# Patient Record
Sex: Female | Born: 1975
Health system: Southern US, Community
[De-identification: ages and names within clinical notes are randomized; demographics above are authoritative.]

## PROBLEM LIST (undated history)

## (undated) DIAGNOSIS — T7840XA Allergy, unspecified, initial encounter: Secondary | ICD-10-CM

## (undated) DIAGNOSIS — U071 COVID-19: Secondary | ICD-10-CM

## (undated) DIAGNOSIS — F329 Major depressive disorder, single episode, unspecified: Secondary | ICD-10-CM

## (undated) DIAGNOSIS — F32A Depression, unspecified: Secondary | ICD-10-CM

## (undated) DIAGNOSIS — J45909 Unspecified asthma, uncomplicated: Secondary | ICD-10-CM

## (undated) HISTORY — DX: COVID-19: U07.1

## (undated) HISTORY — DX: Major depressive disorder, single episode, unspecified: F32.9

## (undated) HISTORY — PX: CHOLECYSTECTOMY: SHX55

## (undated) HISTORY — DX: Depression, unspecified: F32.A

## (undated) HISTORY — PX: NASAL SINUS SURGERY: SHX719

## (undated) HISTORY — PX: AUGMENTATION MAMMAPLASTY: SUR837

## (undated) HISTORY — DX: Allergy, unspecified, initial encounter: T78.40XA

## (undated) HISTORY — DX: Unspecified asthma, uncomplicated: J45.909

## (undated) HISTORY — PX: BREAST SURGERY: SHX581

---

## 2001-10-30 ENCOUNTER — Encounter: Admission: RE | Admit: 2001-10-30 | Discharge: 2002-01-28 | Payer: Self-pay

## 2011-03-14 HISTORY — PX: AUGMENTATION MAMMAPLASTY: SUR837

## 2012-10-11 LAB — HM PAP SMEAR: HM Pap smear: NEGATIVE

## 2013-12-22 ENCOUNTER — Encounter (HOSPITAL_BASED_OUTPATIENT_CLINIC_OR_DEPARTMENT_OTHER): Payer: Self-pay | Admitting: Emergency Medicine

## 2013-12-22 DIAGNOSIS — Z79899 Other long term (current) drug therapy: Secondary | ICD-10-CM | POA: Insufficient documentation

## 2013-12-22 DIAGNOSIS — R079 Chest pain, unspecified: Secondary | ICD-10-CM | POA: Diagnosis not present

## 2013-12-22 NOTE — ED Notes (Signed)
PT presents to ED with complaints of left sided chest pain that started 2 hours ago. Pt states pain is worse with deep breath.

## 2013-12-23 ENCOUNTER — Emergency Department (HOSPITAL_BASED_OUTPATIENT_CLINIC_OR_DEPARTMENT_OTHER)
Admission: EM | Admit: 2013-12-23 | Discharge: 2013-12-23 | Disposition: A | Discharge status: Home / Self Care | Payer: BC Managed Care – PPO | Attending: Emergency Medicine | Admitting: Emergency Medicine

## 2013-12-23 ENCOUNTER — Emergency Department (HOSPITAL_BASED_OUTPATIENT_CLINIC_OR_DEPARTMENT_OTHER): Payer: BC Managed Care – PPO | Attending: Emergency Medicine

## 2013-12-23 ENCOUNTER — Encounter (HOSPITAL_BASED_OUTPATIENT_CLINIC_OR_DEPARTMENT_OTHER): Payer: Self-pay | Admitting: Radiology

## 2013-12-23 DIAGNOSIS — R079 Chest pain, unspecified: Secondary | ICD-10-CM

## 2013-12-23 MED ORDER — IBUPROFEN 800 MG PO TABS: 800.0000 mg | ORAL_TABLET | Freq: Once | ORAL | Status: DC

## 2013-12-23 MED ORDER — NAPROXEN 500 MG PO TABS: 500.0000 mg | ORAL_TABLET | Freq: Two times a day (BID) | ORAL | Status: DC

## 2013-12-23 NOTE — Discharge Instructions (Signed)
Return if symptoms are getting worse.  Chest Pain (Nonspecific) It is often hard to give a specific diagnosis for the cause of chest pain. There is always a chance that your pain could be related to something serious, such as a heart attack or a blood clot in the lungs. You need to follow up with your health care provider for further evaluation. CAUSES   Heartburn.  Pneumonia or bronchitis.  Anxiety or stress.  Inflammation around your heart (pericarditis) or lung (pleuritis or pleurisy).  A blood clot in the lung.  A collapsed lung (pneumothorax). It can develop suddenly on its own (spontaneous pneumothorax) or from trauma to the chest.  Shingles infection (herpes zoster virus). The chest wall is composed of bones, muscles, and cartilage. Any of these can be the source of the pain.  The bones can be bruised by injury.  The muscles or cartilage can be strained by coughing or overwork.  The cartilage can be affected by inflammation and become sore (costochondritis). DIAGNOSIS  Lab tests or other studies may be needed to find the cause of your pain. Your health care provider may have you take a test called an ambulatory electrocardiogram (ECG). An ECG records your heartbeat patterns over a 24-hour period. You may also have other tests, such as:  Transthoracic echocardiogram (TTE). During echocardiography, sound waves are used to evaluate how blood flows through your heart.  Transesophageal echocardiogram (TEE).  Cardiac monitoring. This allows your health care provider to monitor your heart rate and rhythm in real time.  Holter monitor. This is a portable device that records your heartbeat and can help diagnose heart arrhythmias. It allows your health care provider to track your heart activity for several days, if needed.  Stress tests by exercise or by giving medicine that makes the heart beat faster. TREATMENT   Treatment depends on what may be causing your chest pain.  Treatment may include:  Acid blockers for heartburn.  Anti-inflammatory medicine.  Pain medicine for inflammatory conditions.  Antibiotics if an infection is present.  You may be advised to change lifestyle habits. This includes stopping smoking and avoiding alcohol, caffeine, and chocolate.  You may be advised to keep your head raised (elevated) when sleeping. This reduces the chance of acid going backward from your stomach into your esophagus. Most of the time, nonspecific chest pain will improve within 2-3 days with rest and mild pain medicine.  HOME CARE INSTRUCTIONS   If antibiotics were prescribed, take them as directed. Finish them even if you start to feel better.  For the next few days, avoid physical activities that bring on chest pain. Continue physical activities as directed.  Do not use any tobacco products, including cigarettes, chewing tobacco, or electronic cigarettes.  Avoid drinking alcohol.  Only take medicine as directed by your health care provider.  Follow your health care provider's suggestions for further testing if your chest pain does not go away.  Keep any follow-up appointments you made. If you do not go to an appointment, you could develop lasting (chronic) problems with pain. If there is any problem keeping an appointment, call to reschedule. SEEK MEDICAL CARE IF:   Your chest pain does not go away, even after treatment.  You have a rash with blisters on your chest.  You have a fever. SEEK IMMEDIATE MEDICAL CARE IF:   You have increased chest pain or pain that spreads to your arm, neck, jaw, back, or abdomen.  You have shortness of breath.  You  have an increasing cough, or you cough up blood.  You have severe back or abdominal pain.  You feel nauseous or vomit.  You have severe weakness.  You faint.  You have chills. This is an emergency. Do not wait to see if the pain will go away. Get medical help at once. Call your local emergency  services (911 in [place] Do not drive yourself to the hospital. MAKE SURE YOU:   Understand these instructions.  Will watch your condition.  Will get help right away if you are not doing well or get worse. Document Released: 12/07/2004 Document Revised: 03/04/2013 Document Reviewed: 10/03/2007 [facility] Patient Information 2015 [place], [place]. This information is not intended to replace advice given to you by your health care provider. Make sure you discuss any questions you have with your health care provider.  Naproxen and naproxen sodium oral immediate-release tablets What is this medicine? NAPROXEN (na PROX en) is a non-steroidal anti-inflammatory drug (NSAID). It is used to reduce swelling and to treat pain. This medicine may be used for dental pain, headache, or painful monthly periods. It is also used for painful joint and muscular problems such as arthritis, tendinitis, bursitis, and gout. This medicine may be used for other purposes; ask your health care provider or pharmacist if you have questions. COMMON BRAND NAME(S): Aflaxen, Aleve, Aleve Arthritis, All Day Relief, Anaprox, Anaprox DS, Naprosyn What should I tell my health care provider before I take this medicine? They need to know if you have any of these conditions: -asthma -cigarette smoker -drink more than 3 alcohol containing drinks a day -heart disease or circulation problems such as heart failure or leg edema (fluid retention) -high blood pressure -kidney disease -liver disease -stomach bleeding or ulcers -an unusual or allergic reaction to naproxen, aspirin, other NSAIDs, other medicines, foods, dyes, or preservatives -pregnant or trying to get pregnant -breast-feeding How should I use this medicine? Take this medicine by mouth with a glass of water. Follow the directions on the prescription label. Take it with food if your stomach gets upset. Try to not lie down for at least 10 minutes after you take it. Take your  medicine at regular intervals. Do not take your medicine more often than directed. Long-term, continuous use may increase the risk of heart attack or stroke. A special MedGuide will be given to you by the pharmacist with each prescription and refill. Be sure to read this information carefully each time. Talk to your pediatrician regarding the use of this medicine in children. Special care may be needed. Overdosage: If you think you have taken too much of this medicine contact a poison control center or emergency room at once. NOTE: This medicine is only for you. Do not share this medicine with others. What if I miss a dose? If you miss a dose, take it as soon as you can. If it is almost time for your next dose, take only that dose. Do not take double or extra doses. What may interact with this medicine? -alcohol -aspirin -cidofovir -diuretics -lithium -methotrexate -other drugs for inflammation like ketorolac or prednisone -pemetrexed -probenecid -warfarin This list may not describe all possible interactions. Give your health care provider a list of all the medicines, herbs, non-prescription drugs, or dietary supplements you use. Also tell them if you smoke, drink alcohol, or use illegal drugs. Some items may interact with your medicine. What should I watch for while using this medicine? Tell your doctor or health care professional if your pain  does not get better. Talk to your doctor before taking another medicine for pain. Do not treat yourself. This medicine does not prevent heart attack or stroke. In fact, this medicine may increase the chance of a heart attack or stroke. The chance may increase with longer use of this medicine and in people who have heart disease. If you take aspirin to prevent heart attack or stroke, talk with your doctor or health care professional. Do not take other medicines that contain aspirin, ibuprofen, or naproxen with this medicine. Side effects such as stomach  upset, nausea, or ulcers may be more likely to occur. Many medicines available without a prescription should not be taken with this medicine. This medicine can cause ulcers and bleeding in the stomach and intestines at any time during treatment. Do not smoke cigarettes or drink alcohol. These increase irritation to your stomach and can make it more susceptible to damage from this medicine. Ulcers and bleeding can happen without warning symptoms and can cause death. You may get drowsy or dizzy. Do not drive, use machinery, or do anything that needs mental alertness until you know how this medicine affects you. Do not stand or sit up quickly, especially if you are an older patient. This reduces the risk of dizzy or fainting spells. This medicine can cause you to bleed more easily. Try to avoid damage to your teeth and gums when you brush or floss your teeth. What side effects may I notice from receiving this medicine? Side effects that you should report to your doctor or health care professional as soon as possible: -black or bloody stools, blood in the urine or vomit -blurred vision -chest pain -difficulty breathing or wheezing -nausea or vomiting -severe stomach pain -skin rash, skin redness, blistering or peeling skin, hives, or itching -slurred speech or weakness on one side of the body -swelling of eyelids, throat, lips -unexplained weight gain or swelling -unusually weak or tired -yellowing of eyes or skin Side effects that usually do not require medical attention (report to your doctor or health care professional if they continue or are bothersome): -constipation -headache -heartburn This list may not describe all possible side effects. Call your doctor for medical advice about side effects. You may report side effects to FDA at 1-800-FDA-1088. Where should I keep my medicine? Keep out of the reach of children. Store at room temperature between 15 and 30 degrees C (59 and 86 degrees F).  Keep container tightly closed. Throw away any unused medicine after the expiration date. NOTE: This sheet is a summary. It may not cover all possible information. If you have questions about this medicine, talk to your doctor, pharmacist, or health care provider.  2015, Elsevier/[employer] Standard. (2009-03-01 20:10:16)

## 2013-12-23 NOTE — ED Provider Notes (Signed)
CSN: [id number]     Arrival date & time 12/22/13  2342 History   First MD Initiated Contact with Patient 12/23/13 0258     Chief Complaint  Patient presents with  . Chest Pain     (Consider location/radiation/quality/duration/timing/severity/associated sxs/prior Treatment) Patient is a 38 y.o. female presenting with chest pain. The history is provided by the patient.  Chest Pain She has been having pains across her anterior chest with some radiation into the back for about the last 2 weeks. Symptoms waxed and waned. It is mostly an achy pain but sometimes will become a pressure feeling. Pain is constant. It seems to be worse when she lays down but nothing seems to make it better. Pain has been 5/10 at its worst and is currently 1/10. She denies pain being worse with deep breathing although she stated this is the case at triage. Pain is not exacerbated by movement or exertion. She had one episode where she had mild nausea while laying flat. She took a dose of acetaminophen with diphenhydramine which did not give significant relief. She has no cardiac risk factors. Specifically, it she denies history of tobacco use, hypertension, diabetes, hyperlipidemia, or family history of premature coronary atherosclerosis.  History reviewed. No pertinent past medical history. Past Surgical History  Procedure Laterality Date  . Cholecystectomy     No family history on file. History  Substance Use Topics  . Smoking status: Never Smoker   . Smokeless tobacco: Not on file  . Alcohol Use: Not on file   OB History   Grav Para Term Preterm Abortions TAB SAB Ect Mult Living                 Review of Systems  Cardiovascular: Positive for chest pain.  All other systems reviewed and are negative.     Allergies  Review of patient's allergies indicates no known allergies.  Home Medications   Prior to Admission medications   Medication Sig Start Date End Date Taking? Authorizing Provider   norethindrone-ethinyl estradiol-iron (ESTROSTEP FE,TILIA FE,TRI-LEGEST FE) 1-20/1-30/1-35 MG-MCG tablet Take 1 tablet by mouth daily.   Yes Historical Provider, MD   BP 148/86  Pulse 71  Temp(Src) 98 F (36.7 C) (Oral)  Resp 14  Ht 5\' 7"  (1.702 m)  Wt 215 lb (97.523 kg)  BMI 33.67 kg/m2  SpO2 100%  LMP 12/08/2013 Physical Exam  Nursing note and vitals reviewed.  38 year old female, resting comfortably and in no acute distress. Vital signs are significant for borderline hypertension. Oxygen saturation is 100%, which is normal. Head is normocephalic and atraumatic. PERRLA, EOMI. Oropharynx is clear. Neck is nontender and supple without adenopathy or JVD. Back is nontender and there is no CVA tenderness. Lungs are clear without rales, wheezes, or rhonchi. Chest is mildly tender across the anterior chest which does reproduce her pain. Heart has regular rate and rhythm without murmur. Abdomen is soft, flat, nontender without masses or hepatosplenomegaly and peristalsis is normoactive. Extremities have no cyanosis or edema, full range of motion is present. Skin is warm and dry without rash. Neurologic: Mental status is normal, cranial nerves are intact, there are no motor or sensory deficits.  ED Course  Procedures (including critical care time) Imaging Review Dg Chest 2 View  12/23/2013   CLINICAL DATA:  Chest pain.  Initial encounter.  EXAM: CHEST  2 VIEW  COMPARISON:  None.  FINDINGS: Normal heart size and mediastinal contours. No acute infiltrate or edema. No effusion or pneumothorax.  No acute osseous findings.  IMPRESSION: No active cardiopulmonary disease.   Electronically Signed   By: Jorje Guild M.D.   On: 12/23/2013 00:21     EKG Interpretation   Date/Time:  Monday December 22 2013 23:54:07 EDT Ventricular Rate:  74 PR Interval:  152 QRS Duration: 80 QT Interval:  384 QTC Calculation: 426 R Axis:   41 Text Interpretation:  Normal sinus rhythm Low voltage QRS  Borderline ECG  No old tracing to compare Confirmed by [facility]  MD, Elson Ulbrich ([id number]) on  12/22/2013 11:54:23 PM      MDM   Final diagnoses:  Chest pain, unspecified chest pain type    Chest pain of uncertain cause. However, her risk of cardiac disease is extremely small. She has had constant pain for 2 weeks without any worsening with exertion and has no cardiac risk factors whatsoever, had a normal ECG, and a normal x-ray. She has no risk factors for DVT other than oral contraceptives, but has normal respiratory rate and normal pulse rate and oxygen saturation 100%, so I do not feel that she needs screening with a d-dimer. I discussed the patient possibly obtaining a troponin to be complete. However, she states that she is anxious to go because her husband has to get home. Her risk of cardiac diseases lung but I feel that she can be followed safely as an outpatient. Chest wall tenderness suggests costochondritis. She'll be given a prescription for naproxen but is to followup with her PCP later this week and return if symptoms worsen.    Delora Fuel, MD 49/75/30 0511

## 2014-10-22 LAB — HM MAMMOGRAPHY: HM MAMMO: NEGATIVE

## 2014-10-28 LAB — HM MAMMOGRAPHY: HM MAMMO: NEGATIVE

## 2014-11-02 LAB — HM MAMMOGRAPHY: HM MAMMO: NEGATIVE

## 2014-11-11 ENCOUNTER — Telehealth: Payer: Self-pay

## 2014-11-11 NOTE — Telephone Encounter (Signed)
Pre visit call completed 

## 2014-11-11 NOTE — Telephone Encounter (Signed)
LMOVM

## 2014-11-12 ENCOUNTER — Ambulatory Visit (INDEPENDENT_AMBULATORY_CARE_PROVIDER_SITE_OTHER): Payer: BLUE CROSS/BLUE SHIELD | Admitting: Medical

## 2014-11-12 ENCOUNTER — Encounter: Payer: Self-pay | Admitting: Medical

## 2014-11-12 VITALS — BP 116/65 | HR 76 | Temp 98.7°F | Ht 67.5 in | Wt 210.8 lb

## 2014-11-12 DIAGNOSIS — M546 Pain in thoracic spine: Secondary | ICD-10-CM

## 2014-11-12 DIAGNOSIS — J309 Allergic rhinitis, unspecified: Secondary | ICD-10-CM

## 2014-11-12 DIAGNOSIS — R0789 Other chest pain: Secondary | ICD-10-CM | POA: Insufficient documentation

## 2014-11-12 DIAGNOSIS — M549 Dorsalgia, unspecified: Secondary | ICD-10-CM | POA: Insufficient documentation

## 2014-11-12 DIAGNOSIS — J3089 Other allergic rhinitis: Secondary | ICD-10-CM | POA: Diagnosis not present

## 2014-11-12 DIAGNOSIS — M940 Chondrocostal junction syndrome [Tietze]: Secondary | ICD-10-CM | POA: Insufficient documentation

## 2014-11-12 HISTORY — DX: Allergic rhinitis, unspecified: J30.9

## 2014-11-12 HISTORY — DX: Chondrocostal junction syndrome (tietze): M94.0

## 2014-11-12 HISTORY — DX: Dorsalgia, unspecified: M54.9

## 2014-11-12 HISTORY — DX: Other chest pain: R07.89

## 2014-11-12 MED ORDER — KETOROLAC TROMETHAMINE 60 MG/2ML IM SOLN
60.0000 mg | Freq: Once | INTRAMUSCULAR | Status: AC
  Administered 2014-11-12: 60 mg via INTRAMUSCULAR

## 2014-11-12 MED ORDER — DICLOFENAC SODIUM 75 MG PO TBEC: 75.0000 mg | DELAYED_RELEASE_TABLET | Freq: Two times a day (BID) | ORAL | Status: DC

## 2014-11-12 NOTE — Assessment & Plan Note (Addendum)
Hx of and will get ekg today. But no current pain. Also note that work ups in ED and stress test were negative. Cardiologist stress test was negative.  If any chest type pain in future advised ED eval. Will await old records. Ask pt to sign release form.

## 2014-11-12 NOTE — Progress Notes (Signed)
Pre visit review using our clinic review tool, if applicable. No additional management support is needed unless otherwise documented below in the visit note. 

## 2014-11-12 NOTE — Progress Notes (Signed)
Subjective:    Patient ID: Lisa Ellis, female    DOB: 1976/02/27, 40 y.o.   MRN: [id number]  HPI  I have reviewed pt PMH, PSH, FH, Social History and Surgical History.  Allergies- Year round allergies. Pt uses otc nasal spray. Afrin. Some allergies symptoms recent nasal congestion and runny nose. Not reported severe congestion like rhinitis medicamentosa.   Dad- deceased possible tumor of brainstem.  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 More high protein diet. Married- no children.  LMP- just started yesterday.  Pt mentions notes some pain lower chest/rib/ xyphoid area. Pain since December- Pain is present all the time even without activity. Even at rest. No rash with area. Pt states pain does hurt on deep inspiration. Pt had chest xray, mri and stress test. All these studies were negative. Pt went to 2-3 visit to ED. All works up were negative. Pt did see a cardiologist.   With this pain denies burping or belching. Pain not worse lying supine.   Pain gets worse with breathing. Pt in past given gabapentin, meloxicam and flexeril. Pt states was Gabapentin for one month. Did not help at all.    Today she describes pain that is more just left of tspine area level of lower scapula mild pain on deep inspiration.(this pain in back radiates forward on dermatomal distribution. ) Lt latisimuss dorsi area mild pain on palpation. This area is almost always tender palpation. Pt states accupuncture recently helped with pain. Gyn of hers thought some costochondritis features. Some time pain can radiate left costochandral/sterum/lower rib region to back around dermatome distribution.      Review of Systems  Constitutional: Negative for fever, chills and diaphoresis.  HENT: Positive for congestion. Negative for ear pain, postnasal drip, rhinorrhea, sinus pressure and sneezing.        Nose  always feel inflammed.  Respiratory: Negative for cough, chest tightness, shortness of breath and wheezing.   Cardiovascular: Negative for chest pain and palpitations.       See hpi. Atypical chest pain but not today.  Musculoskeletal: Positive for back pain.       See hpi.  Skin: Negative for rash.  Neurological: Negative for dizziness, speech difficulty, weakness, numbness and headaches.  Hematological: Negative for adenopathy. Does not bruise/bleed easily.  Psychiatric/Behavioral: Negative for behavioral problems and confusion.    Past Medical History  Diagnosis Date  . Allergy   . Asthma     Only wheezed on time associated with pneumonia. none since.  . Depression     only had brief episode in 2006 for couple of months when dad passed away.    Social History   Social History  . Marital Status: Unknown    Spouse Name: N/A  . Number of Children: N/A  . Years of Education: N/A   Occupational History  . Not on file.   Social History Main Topics  . Smoking status: Never Smoker   . Smokeless tobacco: Never Used  . Alcohol Use: No  . Drug Use: No  . Sexual Activity: Yes   Other Topics Concern  . Not on file   Social History Narrative    Past Surgical History  Procedure Laterality Date  . Cholecystectomy    . Nasal sinus surgery    . Breast surgery      augmentation procedure.    Family History  Problem Relation  Age of Onset  . Hypertension Mother   . Diabetes Father   . Hypertension Maternal Grandfather   . Diabetes Paternal Grandmother     No Known Allergies  Current Outpatient Prescriptions on File Prior to Visit  Medication Sig Dispense Refill  . cyclobenzaprine (FLEXERIL) 10 MG tablet TK 1/2 TO 1 T PO HS FOR SPASM  1  . meloxicam (MOBIC) 15 MG tablet TK 1 T PO D  1  . naproxen (NAPROSYN) 500 MG tablet Take 1 tablet (500 mg total) by mouth 2 (two) times daily. (Patient not taking: Reported on 11/11/2014) 30 tablet 0  . norethindrone-ethinyl  estradiol-iron (ESTROSTEP FE,TILIA FE,TRI-LEGEST FE) 1-20/1-30/1-35 MG-MCG tablet Take 1 tablet by mouth daily.     No current facility-administered medications on file prior to visit.    BP 116/65 mmHg  Pulse 76  Temp(Src) 98.7 F (37.1 C) (Oral)  Ht 5' 7.5" (1.715 m)  Wt 210 lb 12.8 oz (95.618 kg)  BMI 32.51 kg/m2  SpO2 97%  LMP 11/11/2014       Objective:   Physical Exam  General Mental Status- Alert. General Appearance- Not in acute distress.     Skin Rashes- No Rashes.  HEENT Head- Normal. Ear Auditory Canal - Left- Normal. Right - Normal.Tympanic Membrane- Left- Normal. Right- Normal. Eye Sclera/Conjunctiva- Left- Normal. Right- Normal. Nose & Sinuses Nasal Mucosa- Left-  Boggy and Congested. Right-  Boggy and  Congested.Bilateral no  maxillary and no  frontal sinus pressure. Mouth & Throat Lips: Upper Lip- Normal: no dryness, cracking, pallor, cyanosis, or vesicular eruption. Lower Lip-Normal: no dryness, cracking, pallor, cyanosis or vesicular eruption. Buccal Mucosa- Bilateral- No Aphthous ulcers. Oropharynx- No Discharge or Erythema. Tonsils: Characteristics- Bilateral- No Erythema or Congestion. Size/Enlargement- Bilateral- No enlargement. Discharge- bilateral-None.   Skin General: Color- Normal Color. Moisture- Normal Moisture.  Neck Carotid Arteries- Normal color. Moisture- Normal Moisture. No carotid bruits. No JVD.  Chest and Lung Exam Auscultation: Breath Sounds:-Normal. CTA.  Cardiovascular Auscultation:Rythm- Regular,Rate and Rhythm. Murmurs & Other Heart Sounds:Auscultation of the heart reveals- No Murmurs.  Abdomen Inspection:-Inspeection Normal. Palpation/Percussion:Note:No mass. Palpation and Percussion of the abdomen reveal- Non Tender, Non Distended + BS, no rebound or guarding.    Neurologic Cranial Nerve exam:- CN III-XII intact(No nystagmus), symmetric smile. Strength:- 5/5 equal and symmetric strength both upper and lower  extremities.  Anterior chest- no pain on palpation today. But on deep breathing left side back/adjacent to tspine.(prior cxr negative per pt.)  Back- mild pain palpation. Lt of tspine. Scapula level. Lt latisimuss dorsi. Mild pain on palpation.      Assessment & Plan:  ekg today showed nsr.

## 2014-11-12 NOTE — Assessment & Plan Note (Signed)
Left side thoracic with some pain in latisimuss dorsi on palpation. Radiating at times dermatomal distribution. So will give toradol 60 mg im. Rx diclofenac as well. Call us tomorrow and let us know if pain subsided with injection.

## 2014-11-12 NOTE — Assessment & Plan Note (Signed)
Some features by pt description.

## 2014-11-12 NOTE — Patient Instructions (Addendum)
Atypical chest pain Hx of and will get ekg today. But no current pain. Also note that work ups in ED and stress test were negative. Cardiologist stress test was negative.  If any chest type pain in future advised ED eval. Will await old records. Ask pt to sign release form.   Back pain Left side thoracic with some pain in latisimuss dorsi on palpation. Radiating at times dermatomal distribution. So will give toradol 60 mg im. Rx diclofenac as well. Call us tomorrow and let us know if pain subsided with injection.    Costochondritis Some features by pt description.   Since pain is often in lt latisumuss dorsi area and reproducible on palaption. Will refer you to PT.  Follow up in 7 days or as needed.

## 2014-11-12 NOTE — Assessment & Plan Note (Signed)
Chronic. Advised nasal steroids preferred rather than afrin. 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

## 2014-11-20 ENCOUNTER — Ambulatory Visit: Payer: BLUE CROSS/BLUE SHIELD | Attending: Medical | Admitting: Physical Therapy

## 2014-11-20 DIAGNOSIS — M546 Pain in thoracic spine: Secondary | ICD-10-CM | POA: Insufficient documentation

## 2014-11-20 NOTE — Therapy (Deleted)
[facility] [address]  [place], [place], [address] Phone: [phone]   Fax:  [phone]  Patient Details  Name: Lisa Ellis MRN: [id number] Date of Birth: 09-Jul-1975 Referring Provider:  [name], PA-C  Encounter Date: 11/20/2014   [name] 11/20/2014, [name] PM  [facility] [address]  [place], [place], [address] Phone: [phone]   Fax:  [phone]

## 2014-11-20 NOTE — Therapy (Signed)
[facility] [address]  [place], [place], [address] Phone: [phone]   Fax:  [phone]  Physical Therapy Evaluation  Patient Details  Name: Lisa Ellis MRN: [id number] Date of Birth: [birth year] Referring Provider:  [name], PA-C  Encounter Date: 11/20/2014      PT End of Session - 11/20/14 1025    Visit Number 1   Number of Visits 8   Date for PT Re-Evaluation 12/18/14   PT Start Time 0930   PT Stop Time 9937   PT Time Calculation (min) 44 min   Activity Tolerance Patient tolerated treatment well   Behavior During Therapy [facility] for tasks assessed/performed      Past Medical History  Diagnosis Date  . Allergy   . Asthma     Only wheezed on time associated with pneumonia. none since.  . Depression     only had brief episode in 2006 for couple of months when dad passed away.    Past Surgical History  Procedure Laterality Date  . Cholecystectomy    . Nasal sinus surgery    . Breast surgery      augmentation procedure.    There were no vitals filed for this visit.  Visit Diagnosis:  Left-sided thoracic back pain      Subjective Assessment - 11/20/14 0934    Subjective Patient reports onset of chest pain on left under breast in Dec 2015. Cardiology and neurology ruled out any pathology. Patient states GYN thought it might be costochondritis and was given anti-inflammatory. Sought another opinion when pain remained unrelieved and referred for PT for possible muscular origin for pain.   Patient Stated Goals "Get rid of pain"   Currently in Pain? Yes   Pain Score 3   Least 1-2/10, Avg 2-3/10, Worst (at night) 6-7/10)   Pain Location Rib cage   Pain Orientation Left   Pain Descriptors / Indicators Dull;Aching   Pain Type Chronic pain   Pain Radiating Towards Patient reports occasional radicular symptoms along radial nerve distribution   Pain Onset More than a month ago   Pain Frequency  Constant  with varying intensity   Aggravating Factors  Sleeping   Pain Relieving Factors nothing consistent   Effect of Pain on Daily Activities DIsrupts sleep at times            [facility] PT Assessment - 11/20/14 0930    Assessment   Medical Diagnosis Atypical chest pain   Onset Date/Surgical Date --  Dec 2015   Next MD Visit as needed   Prior Therapy accupunture with limited benefit   Balance Screen   Has the patient fallen in the past 6 months No   Has the patient had a decrease in activity level because of a fear of falling?  No   Is the patient reluctant to leave their home because of a fear of falling?  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   Leisure Artist   Observation/Other Assessments   Focus on Therapeutic Outcomes (FOTO)  74% (26% limitation); Predicted 76% (24% limitation)   ROM / Strength   AROM / PROM / Strength AROM;Strength   AROM   Overall AROM  Within functional limits for tasks performed   Overall AROM Comments Mild "pull" with cervical flexion   Palpation   Palpation comment ttp under left  axilla over latissimus dorsi & serratus anterior        Today's Treatment  TherEx Prayer stretch - center & right x20" Latissimus dorsi stretch x20" Low row with green TB 10x3" Radial nerve glide x5           PT Education - 11/20/14 1325    Education provided Yes   Education Details Initial HEP   Person(s) Educated Patient   Methods Explanation;Demonstration;Handout   Comprehension Verbalized understanding;Returned demonstration;Need further instruction             PT Long Term Goals - 11/20/14 1318    PT LONG TERM GOAL #1   Title Patient will be independent with HEP (12/18/14)   Time 4   Period Weeks   Status New   PT LONG TERM GOAL #2   Title Patient will report pain at worst <4/10 in lateral thoracic region (12/18/14)   Time  4   Period Weeks   Status New   PT LONG TERM GOAL #3   Title Patient will report no sleep disturbance due to pain (12/18/14)   Time 4   Period Weeks   Status New   PT LONG TERM GOAL #4   Title Patient will be independent with self trigger point release for serratus anterior as indicated (12/18/14)   Time 4   Period Weeks   Status New   PT LONG TERM GOAL #5   Title Patient will be independent with radial nerve glide as needed for radicular symptoms (12/18/14)   Time 4   Period Weeks   Status New               Plan - 11/20/14 1105    Clinical Impression Statement Patient is a 39 y/o female who was referred to OP PT with complaints of atypical chest pain of presumed muscular origin (ruled out for cardiac or neurological origin). Upon evaluation, pain is most pronounced in the left lateral rib cage just distal to the axilla, with tenderness to palpation and increased muscle tension consistsent with strain to serratus anterior or latissimus dorsi. All cervical, throracic, lumaber and bilateral UE ROM WFL without pain except slight "pull' felt with cervical flexion.   Pt will benefit from skilled therapeutic intervention in order to improve on the following deficits Pain;Increased muscle spasms;Impaired flexibility;Postural dysfunction   Rehab Potential Good   PT Frequency 2x / week   PT Duration 4 weeks   PT Treatment/Interventions Manual techniques;Ultrasound;Therapeutic exercise;Taping;Electrical Stimulation;Iontophoresis 4mg /ml Dexamethasone;Cryotherapy;Dry needling   PT Next Visit Plan Review HEP, Ultrasound and STM to serratus anterior/latissimus dorsi   Consulted and Agree with Plan of Care Patient         Problem List Patient Active Problem List   Diagnosis Date Noted  . Atypical chest pain 11/12/2014  . Back pain 11/12/2014  . Costochondritis 11/12/2014  . Allergic rhinitis 11/12/2014    [name], PT, MPT 11/20/2014, 1:28 PM  [facility] [address]  [place], [place], [address] Phone: [phone]   Fax:  [phone]

## 2014-11-24 ENCOUNTER — Telehealth: Payer: Self-pay | Admitting: Medical

## 2014-11-24 NOTE — Telephone Encounter (Signed)
[facility] - Client TELEPHONE ADVICE RECORD   [facility]     Patient Name: [facility] Client [facility] - Client    Client Site [facility] - Day    Physician Velva, [place] Type Call  Gender: Female Call Type Triage / Clinical  DOB: 06/16/1975  Relationship To Patient Self  Age: 39 Y 7 M 11 D Return Phone Number [phone] (Primary)  Return Phone Number: [phone] (Primary) Chief Complaint Heart palpitations or irregular heartbeat  Address:  Initial Comment caller states pt has heart palpitations and a headache   City/State/Zip: [place]  PreDisposition Go to Urgent Care/Walk-In Clinic    Nurse Assessment  Nurse: Amalia Hailey, RN, Melissa Date/Time (Eastern Time): 11/24/2014 3:22:08 PM  Confirm and document reason for call. If symptomatic, describe symptoms. ---caller states pt has heart palpitations and a headache   Has the patient traveled out of the country within the last 30 days? ---Not Applicable   Does the patient require triage? ---Yes   Related visit to physician within the last 2 weeks? ---No   Does the PT have any chronic conditions? (i.e. diabetes, asthma, etc.) ---Yes   List chronic conditions. ---rib inflammation- NSAIDS   Did the patient indicate they were pregnant? ---No     Guidelines      Guideline Title Affirmed Question Affirmed Notes Nurse Date/Time (Eastern Time)  Heart Rate and Heartbeat Questions Problems with anxiety or stress  Evans, RN, Melissa 11/24/2014 3:25:18 PM  Disp. Time Eilene Ghazi Time) Disposition Final User         11/24/2014 3:36:06 PM See PCP within 2 Weeks Yes Amalia Hailey, RN, Lenna Sciara         Caller Understands: Yes   Disagree/Comply: Comply      Care Advice Given Per Guideline         * Everybody experiences palpitations at some point in their lives. In many circumstances it is simply a heightened awareness of the heart's normal beating. * Patients  with anxiety or stress may describe a 'rapid heart beat' or 'pounding' in their chest from their heart beating. * Occasional extra heart beats are experienced by most everyone. Lack of sleep, stress, and caffeinated beverages can aggravate this condition. * Sleep - Try to get sufficient amount of sleep. Lack of sleep can aggravate palpitations. Most people need 7-8 hours of sleep each night. HEALTH BASICS: * Regular exercise will improve your overall health, improve your mood, and is a simple method to reduce stress. * Eat a balanced healthy diet. * Drink adequate liquids - 6-8 glasses of water daily. AVOID CAFFEINE: LIMIT ALCOHOL: Limit your alcohol consumption to no more than 2 drinks a day. Ideally, eliminate alcohol entirely for the next two weeks. OTHER: * Avoid diet pills (Reason: they act as stimulants.) * For smokers - Stop or reduce your smoking CALL BACK IF: * You become worse.   After Care Instructions Given     Call Event Type User Date / Time Description

## 2014-11-25 ENCOUNTER — Ambulatory Visit: Payer: BLUE CROSS/BLUE SHIELD | Admitting: Physical Therapy

## 2014-11-25 ENCOUNTER — Ambulatory Visit (INDEPENDENT_AMBULATORY_CARE_PROVIDER_SITE_OTHER): Payer: BLUE CROSS/BLUE SHIELD | Admitting: Medical

## 2014-11-25 ENCOUNTER — Encounter: Payer: Self-pay | Admitting: Medical

## 2014-11-25 ENCOUNTER — Telehealth: Payer: Self-pay | Admitting: *Deleted

## 2014-11-25 VITALS — BP 110/70 | HR 88 | Temp 97.9°F | Resp 16 | Ht 67.5 in | Wt 211.0 lb

## 2014-11-25 DIAGNOSIS — R51 Headache: Secondary | ICD-10-CM | POA: Diagnosis not present

## 2014-11-25 DIAGNOSIS — M546 Pain in thoracic spine: Secondary | ICD-10-CM

## 2014-11-25 DIAGNOSIS — M542 Cervicalgia: Secondary | ICD-10-CM

## 2014-11-25 DIAGNOSIS — Z566 Other physical and mental strain related to work: Secondary | ICD-10-CM

## 2014-11-25 DIAGNOSIS — R519 Headache, unspecified: Secondary | ICD-10-CM

## 2014-11-25 DIAGNOSIS — R002 Palpitations: Secondary | ICD-10-CM | POA: Diagnosis not present

## 2014-11-25 LAB — SEDIMENTATION RATE: Sed Rate: 10 mm/hr (ref 0–22)

## 2014-11-25 MED ORDER — TIZANIDINE HCL 4 MG PO CAPS: ORAL_CAPSULE | ORAL | Status: DC

## 2014-11-25 MED ORDER — SERTRALINE HCL 25 MG PO TABS: 25.0000 mg | ORAL_TABLET | Freq: Every day | ORAL | Status: DC

## 2014-11-25 NOTE — Progress Notes (Signed)
Subjective:    Patient ID: Lisa Ellis, female    DOB: 08-01-1975, 39 y.o.   MRN: [id number]  HPI  Pt in stating she has been having some episodes of pounding sensation to her chest at time. She states will feel these strong beats for a few minutes and then will resolve(This has been case since last visit. Also hx of palpitationoverall for 6 months). Pt states she has been getting these almost daily basis since I saw her. Some at night as well. Noticed this prominent on Sunday. Last felt pounding/palptitaion sensation last night lasted 5 minutes. Does not report other cardiac type associated signs or symptoms. No symptoms currently on interview.  Pt admits stress and anxiety related to work.(she thinks normal variant stress) Her family owns [employer]. States they are pretty busy.  Pt neck muscles feel constantly tight and mild painful. Shoulder feel tight and mild ha.  Pt updates me that her latisimuss dorsi area is getting better with .    Review of Systems  Constitutional: Negative for fever, chills and fatigue.  Respiratory: Negative for cough, chest tightness, shortness of breath and wheezing.   Cardiovascular: Positive for chest pain and palpitations.  Musculoskeletal: Positive for neck pain. Negative for back pain, joint swelling and neck stiffness.  Neurological: Positive for headaches. Negative for dizziness, speech difficulty, weakness and numbness.  Hematological: Negative for adenopathy. Does not bruise/bleed easily.  Psychiatric/Behavioral: Negative for behavioral problems and confusion.       Pt thinks maybe some axiety dealing with stress from work.    Past Medical History  Diagnosis Date  . Allergy   . Asthma     Only wheezed on time associated with pneumonia. none since.  . Depression     only had brief episode in 2006 for couple of months when dad passed away.    Social History   Social History  . Marital Status: Unknown    Spouse Name: N/A  . Number  of Children: N/A  . Years of Education: N/A   Occupational History  . Not on file.   Social History Main Topics  . Smoking status: Never Smoker   . Smokeless tobacco: Never Used  . Alcohol Use: No  . Drug Use: No  . Sexual Activity: Yes   Other Topics Concern  . Not on file   Social History Narrative    Past Surgical History  Procedure Laterality Date  . Cholecystectomy    . Nasal sinus surgery    . Breast surgery      augmentation procedure.    Family History  Problem Relation Age of Onset  . Hypertension Mother   . Diabetes Father   . Hypertension Maternal Grandfather   . Diabetes Paternal Grandmother     No Known Allergies  Current Outpatient Prescriptions on File Prior to Visit  Medication Sig Dispense Refill  . cyclobenzaprine (FLEXERIL) 10 MG tablet TK 1/2 TO 1 T PO HS FOR SPASM  1  . diclofenac (VOLTAREN) 75 MG EC tablet Take 1 tablet (75 mg total) by mouth 2 (two) times daily. (Patient not taking: Reported on 11/25/2014) 20 tablet 0  . meloxicam (MOBIC) 15 MG tablet TK 1 T PO D  1  . naproxen (NAPROSYN) 500 MG tablet Take 1 tablet (500 mg total) by mouth 2 (two) times daily. (Patient not taking: Reported on 11/11/2014) 30 tablet 0   No current facility-administered medications on file prior to visit.    BP 110/70 mmHg  Pulse 88  Temp(Src) 97.9 F (36.6 C) (Oral)  Resp 16  Ht 5' 7.5" (1.715 m)  Wt 211 lb (95.709 kg)  BMI 32.54 kg/m2  SpO2 98%  LMP 11/11/2014       Objective:   Physical Exam  General Mental Status- Alert. General Appearance- Not in acute distress.   Skin General: Color- Normal Color. Moisture- Normal Moisture.  Neck Carotid Arteries- Normal color. Moisture- Normal Moisture. No carotid bruits. No JVD. Trapezius muscles mild tender to palpation. From.   Chest and Lung Exam Auscultation: Breath Sounds:-Normal. CTA  Cardiovascular Auscultation:Rythm- Regular,Regular rate and rhythm. Murmurs & Other Heart  Sounds:Auscultation of the heart reveals- No Murmurs.  Abdomen Inspection:-Inspeection Normal. Palpation/Percussion:Note:No mass. Palpation and Percussion of the abdomen reveal- Non Tender, Non Distended + BS, no rebound or guarding.    Neurologic Cranial Nerve exam:- CN III-XII intact(No nystagmus), symmetric smile. Strength:- 5/5 equal and symmetric strength both upper and lower extremities.      Assessment & Plan:  ekg looks NSR. For your pounding sensation in chest which may represent palpitation vs atypical pain will refer you back to cardiologist that had already initiated work up. Your ekg looked normal today. If you have any worsening or changing signs and symptoms return here prior to that apponitment. If pain after hours then ED.  You describe some normal variant stress but also wonder if some underlying chronic low level anxiety. Will rx sertraline as trial to see if some symptoms improve. And if you feel better overall.  With your neck tightness, soreness and mild bitemporal ha I think this may indicate stress as well. Will rx low dose zanaflex that you can take at end of day when neck feels tight. Can use low dose ibuprofen as well. Will get sed rate blood work today.  Follow up in 2 wks or as needed

## 2014-11-25 NOTE — Patient Instructions (Addendum)
For your pounding sensation in chest which may represent palpitation vs atypical pain will refer you back to cardiologist that had already initiated work up. Your ekg looked normal today. If you have any worsening or changing signs and symptoms return here prior to that apponitment. If pain after hours then ED.  Your describe some normal variant stress but also wonder if some underlying chronic low level anxiety. Will rx sertraline as trial to see if some symptoms improve. And if you feel better overall.  With your neck tightness, soreness and mild bitemporal ha I think this may indicate stress as well. Will rx low dose zanaflex that you can take at end of day when neck feels tight. Can use low dose ibuprofen as well. Will get sed rate blood work today.  Follow up in 2 wks or as needed

## 2014-11-25 NOTE — Progress Notes (Signed)
Pre visit review using our clinic review tool, if applicable. No additional management support is needed unless otherwise documented below in the visit note. 

## 2014-11-25 NOTE — Therapy (Signed)
[facility] [address]  [place], [place], [address] Phone: [phone]   Fax:  [phone]  Physical Therapy Treatment  Patient Details  Name: Lisa Ellis MRN: [id number] Date of Birth: 1976-02-16 Referring Provider:  [name], PA-C  Encounter Date: 11/25/2014      PT End of Session - 11/25/14 1108    Visit Number 2   Number of Visits 8   Date for PT Re-Evaluation 12/18/14   PT Start Time 1102   PT Stop Time 1148   PT Time Calculation (min) 46 min   Activity Tolerance Patient tolerated treatment well   Behavior During Therapy [facility] for tasks assessed/performed      Past Medical History  Diagnosis Date  . Allergy   . Asthma     Only wheezed on time associated with pneumonia. none since.  . Depression     only had brief episode in 2006 for couple of months when dad passed away.    Past Surgical History  Procedure Laterality Date  . Cholecystectomy    . Nasal sinus surgery    . Breast surgery      augmentation procedure.    There were no vitals filed for this visit.  Visit Diagnosis:  Left-sided thoracic back pain      Subjective Assessment - 11/25/14 1105    Subjective Patient states HEP stetches and exercises seem to be helping. Able to run on treadmill yesterday without too much pain.   Currently in Pain? Yes   Pain Score 2    Pain Location Rib cage   Pain Orientation Left              Today's Treatment  TherEx Hooklying on 1/2 foam roll - Chest stretch x2'                                              Shoulder Horizontal Abduction with green TB 2x10, 3" hold                                              Shoulder ER with green TB 2x10, 3" hold Pec minor doorway stretch 3x20" Serratus anterior stretch 3x20" Wall angel 10x3"  Modalities [place] 1.0 mHz, 20% DC, 1.0 W/cm2 x 8 minutes to left serratus anterior  Manual Tx STM mobilization to Lt serratus anterior in Rt  sidelying DTM/trigger point release to Lt serratus anterior in Rt sidelying Manual stretch to Lt serratus anterior in Rt sidelying 3x20"                                                      PT Long Term Goals - 11/25/14 1304    PT LONG TERM GOAL #1   Title Patient will be independent with HEP (12/18/14)   Status On-going   PT LONG TERM GOAL #2   Title Patient will report pain at worst <4/10 in lateral thoracic region (12/18/14)   Status On-going   PT LONG TERM GOAL #3   Title Patient will report no sleep disturbance due  to pain (12/18/14)   Status On-going   PT LONG TERM GOAL #4   Title Patient will be independent with self trigger point release for serratus anterior as indicated (12/18/14)   Status On-going   PT LONG TERM GOAL #5   Title Patient will be independent with radial nerve glide as needed for radicular symptoms (12/18/14)   Status On-going               Plan - 11/25/14 1218    Clinical Impression Statement Patient reporting some initial relief of pain from HEP stretches and exercises along with increased awareness of posture. Focused on STM and DTM mobilization after [place] for serratus anterior trigger point release, followed by stretching and postural training. Patient reporting increased flexibilty upon completion of treatment.   PT Next Visit Plan Review HEP, Ultrasound and STM to serratus anterior/latissimus dorsi, Postural exercises   Consulted and Agree with Plan of Care Patient        Problem List Patient Active Problem List   Diagnosis Date Noted  . Atypical chest pain 11/12/2014  . Back pain 11/12/2014  . Costochondritis 11/12/2014  . Allergic rhinitis 11/12/2014    [name], PT, MPT 11/25/2014, 1:13 PM  [facility] [address]  [place], [place], [address] Phone: [phone]   Fax:  [phone]

## 2014-11-25 NOTE — Telephone Encounter (Signed)
Medical records received via fax from [facility]. Forwarded to [facility]. JG//CMA

## 2014-11-25 NOTE — Telephone Encounter (Signed)
Patient seen 11/25/14 for palpitations with [name], PA.

## 2014-11-30 ENCOUNTER — Ambulatory Visit: Payer: BLUE CROSS/BLUE SHIELD | Admitting: Physical Therapy

## 2014-11-30 DIAGNOSIS — M546 Pain in thoracic spine: Secondary | ICD-10-CM | POA: Diagnosis not present

## 2014-11-30 NOTE — Therapy (Signed)
[facility] [address]  [place], [place], [address] Phone: [phone]   Fax:  [phone]  Physical Therapy Treatment  Patient Details  Name: Lisa Ellis MRN: [id number] Date of Birth: [birth year] Referring Provider:  [name], PA-C  Encounter Date: 11/30/2014      PT End of Session - 11/30/14 1135    Visit Number 3   Number of Visits 8   Date for PT Re-Evaluation 12/18/14   PT Start Time 1102   PT Stop Time 1148   PT Time Calculation (min) 46 min   Activity Tolerance Patient tolerated treatment well   Behavior During Therapy [facility] for tasks assessed/performed      Past Medical History  Diagnosis Date  . Allergy   . Asthma     Only wheezed on time associated with pneumonia. none since.  . Depression     only had brief episode in 2006 for couple of months when dad passed away.    Past Surgical History  Procedure Laterality Date  . Cholecystectomy    . Nasal sinus surgery    . Breast surgery      augmentation procedure.    There were no vitals filed for this visit.  Visit Diagnosis:  Left-sided thoracic back pain      Subjective Assessment - 11/30/14 1134    Subjective Patient reports sleep improved due to taking a AlievePM.   Currently in Pain? Yes   Pain Score 2    Pain Location Rib cage   Pain Orientation Left            Today's Treatment  TherEx Hooklying on 6" foam roll - Chest stretch x2'  Shoulder Horizontal Abduction with green TB 2x10, 3" hold Shoulder ER with green TB 2x10, 3" hold                                            Flexion pullover with 4# 10x3" Pec minor doorway stretch 3x20" Serratus anterior stretch 3x20"  Wall angel 10x3" Shoulder low row with green TB 2x10, 3" hold  Modalities [place] 1.0 mHz, 20% DC, 1.0 W/cm2 x 8 minutes to left serratus anterior  Manual Tx STM  mobilization to Lt serratus anterior in Rt sidelying DTM/trigger point release to Lt serratus anterior in Rt sidelying Manual stretch to Lt serratus anterior in Rt sidelying 3x20"              PT Long Term Goals - 11/25/14 1304    PT LONG TERM GOAL #1   Title Patient will be independent with HEP (12/18/14)   Status On-going   PT LONG TERM GOAL #2   Title Patient will report pain at worst <4/10 in lateral thoracic region (12/18/14)   Status On-going   PT LONG TERM GOAL #3   Title Patient will report no sleep disturbance due to pain (12/18/14)   Status On-going   PT LONG TERM GOAL #4   Title Patient will be independent with self trigger point release for serratus anterior as indicated (12/18/14)   Status On-going   PT LONG TERM GOAL #5   Title Patient will be independent with radial nerve glide as needed for radicular symptoms (12/18/14)   Status On-going               Plan - 11/30/14 1158  Clinical Impression Statement Patient reports able to sleep more soundly when taking Alieve PM. Persists with left serratus tirgger point/muscle spasm  but slwoly improving.   PT Next Visit Plan Ultrasound and STM/DTM to serratus anterior/latissimus dorsi, Postural exercises   Consulted and Agree with Plan of Care Patient        Problem List Patient Active Problem List   Diagnosis Date Noted  . Atypical chest pain 11/12/2014  . Back pain 11/12/2014  . Costochondritis 11/12/2014  . Allergic rhinitis 11/12/2014    [name], PT, MPT 11/30/2014, 12:02 PM  [facility] [address]  [place], [place], [address] Phone: [phone]   Fax:  [phone]

## 2014-12-03 ENCOUNTER — Ambulatory Visit: Payer: BLUE CROSS/BLUE SHIELD | Admitting: Physical Therapy

## 2014-12-03 DIAGNOSIS — M546 Pain in thoracic spine: Secondary | ICD-10-CM

## 2014-12-03 NOTE — Therapy (Signed)
[facility] [address]  [place], [place], [address] Phone: [phone]   Fax:  [phone]  Physical Therapy Treatment  Patient Details  Name: Lisa Ellis MRN: [id number] Date of Birth: Feb 21, 1976 Referring Provider:  [name], PA-C  Encounter Date: 12/03/2014      PT End of Session - 12/03/14 1200    Visit Number 4   Number of Visits 8   Date for PT Re-Evaluation 12/18/14   PT Start Time 0934   PT Stop Time 1032   PT Time Calculation (min) 58 min   Activity Tolerance Patient tolerated treatment well   Behavior During Therapy [facility] for tasks assessed/performed      Past Medical History  Diagnosis Date  . Allergy   . Asthma     Only wheezed on time associated with pneumonia. none since.  . Depression     only had brief episode in 2006 for couple of months when dad passed away.    Past Surgical History  Procedure Laterality Date  . Cholecystectomy    . Nasal sinus surgery    . Breast surgery      augmentation procedure.    There were no vitals filed for this visit.  Visit Diagnosis:  Left-sided thoracic back pain      Subjective Assessment - 12/03/14 0937    Subjective Patient reported increased left shoulder and neck pain disrupting sleep Monday night. States having a very stressful week at work which may be contributing to increased tension/pain.   Currently in Pain? Yes   Pain Score 3    Pain Location Rib cage   Pain Orientation Left         Today's Treatment  TherEx Hooklying on 6" foam roll - Chest stretch x2'  Shoulder Horizontal Abduction with green TB 2x10, 3" hold Shoulder ER with green TB 2x10, 3" hold   Modalities [place] 1.0 mHz, 20% DC, 1.0 W/cm2 x 8 minutes to left serratus anterior (prior to STM/DTM) Estim - Premod continuous with variable beat  frequency 10-20 Hz to Lt upper trap and serratus anterior x 15 minutes   Manual Tx STM mobilization to Lt serratus anterior in Rt sidelying DTM/trigger point release to Lt serratus anterior in Rt sidelying Manual stretch to Lt serratus anterior in Rt sidelying 3x20"  Taping Single strip (5 blocks) to serratus anterior from scapular to lateral ribs           PT Education - 12/03/14 1205    Education provided Yes   Education Details Proper posture body mechanics when sitting at desk at work including adding a lumbar cushion to improve neutral spinal posture   Person(s) Educated Patient   Methods Explanation;Demonstration   Comprehension Verbalized understanding             PT Long Term Goals - 12/03/14 1206    PT LONG TERM GOAL #1   Title Patient will be independent with HEP (12/18/14)   Status On-going   PT LONG TERM GOAL #2   Title Patient will report pain at worst <4/10 in lateral thoracic region (12/18/14)   Status On-going   PT LONG TERM GOAL #3   Title Patient will report no sleep disturbance due to pain (12/18/14)   Status On-going   PT LONG TERM GOAL #4   Title Patient will be independent with self trigger point release for serratus anterior as indicated (12/18/14)   Status On-going   PT LONG TERM GOAL #  5   Title Patient will be independent with radial nerve glide as needed for radicular symptoms (12/18/14)   Status On-going               Plan - 12/03/14 1203    Clinical Impression Statement Pain more of issue this week with new c/o pain in left upper trap and lateral neck. Patient spending increased time at work this week and has increased stress levels due to personnel issues which she thinks may be contributing to pain and muscle tension issues. Discussed sitting posture while working at desk for long periods, with patient admitting to frequently sitting "[place] style" or with one leg tucked up underneath her while sitting. Education provided regarding  muscle imbalance created by sitting posture and encouraged more 90-90 sitting with lumbar support to facilitate improved spinal posture and reduce rounded back, forward head and shoulders. Focus of today's therapy interventions on education, manual therapy for  streching and STM, modalities to reduce pain and taping of serratus anterior to reduce strain over trigger point.   PT Next Visit Plan Ultrasound and STM/DTM to serratus anterior/latissimus dorsi, Postural exercises, response to taping?, assess neck/upper trap pain if continues   Consulted and Agree with Plan of Care Patient        Problem List Patient Active Problem List   Diagnosis Date Noted  . Atypical chest pain 11/12/2014  . Back pain 11/12/2014  . Costochondritis 11/12/2014  . Allergic rhinitis 11/12/2014    [name], PT, MPT 12/03/2014, 12:17 PM  [facility] [address]  [place], [place], [address] Phone: [phone]   Fax:  [phone]

## 2014-12-07 ENCOUNTER — Ambulatory Visit: Payer: BLUE CROSS/BLUE SHIELD | Admitting: Physical Therapy

## 2014-12-07 DIAGNOSIS — M546 Pain in thoracic spine: Secondary | ICD-10-CM | POA: Diagnosis not present

## 2014-12-07 NOTE — Therapy (Signed)
[facility] [address]  [place], [place], [address] Phone: [phone]   Fax:  [phone]  Physical Therapy Treatment  Patient Details  Name: Lisa Ellis MRN: [id number] Date of Birth: November 19, 1975 Referring Provider:  [name], PA-C  Encounter Date: 12/07/2014      PT End of Session - 12/07/14 1038    Visit Number 5   Number of Visits 8   PT Start Time 1016   PT Stop Time 1111   PT Time Calculation (min) 55 min   Activity Tolerance Patient tolerated treatment well   Behavior During Therapy [facility] for tasks assessed/performed      Past Medical History  Diagnosis Date  . Allergy   . Asthma     Only wheezed on time associated with pneumonia. none since.  . Depression     only had brief episode in 2006 for couple of months when dad passed away.    Past Surgical History  Procedure Laterality Date  . Cholecystectomy    . Nasal sinus surgery    . Breast surgery      augmentation procedure.    There were no vitals filed for this visit.  Visit Diagnosis:  Left-sided thoracic back pain      Subjective Assessment - 12/07/14 1018    Subjective Patient reports feeling much better after last visit, not sure if it was the estim or the taping but something helped.   Currently in Pain? Yes   Pain Score 1    Pain Location Rib cage   Pain Orientation Left           Today's Treatment  TherEx Hooklying on 6" foam roll - Chest stretch x2'  Bil Shoulder Horizontal Abduction with green TB 2x10, 3" hold Bil Shoulder ER with green TB 2x10, 3" hold  Flexion pullover with 4# 10x3"                                            Lt Serratus punch with 2# 10x3" Serratus anterior stretch sidelying on 6" foam roller    Modalities [place] 1.0 mHz, 20% DC,  1.0 W/cm2 x 8 minutes to left serratus anterior (prior to STM/DTM) Estim - IFC 40% scan, beat freq 80-150 Hz to Lt serratus anterior x 15 minutes, intensity to patient tolerance (12)  Manual Tx STM mobilization to Lt serratus anterior in Rt sidelying DTM/trigger point release to Lt serratus anterior in Rt sidelying Manual stretch to Lt serratus anterior in Rt sidelying 3x20"  Taping Single strip (5 blocks) to serratus anterior from scapula to lateral ribs, 50% stretch            PT Long Term Goals - 12/07/14 1102    PT LONG TERM GOAL #1   Title Patient will be independent with HEP (12/18/14)   Status On-going   PT LONG TERM GOAL #2   Title Patient will report pain at worst <4/10 in lateral thoracic region (12/18/14)   Status On-going   PT LONG TERM GOAL #3   Title Patient will report no sleep disturbance due to pain (12/18/14)   Status On-going   PT LONG TERM GOAL #4   Title Patient will be independent with self trigger point release for serratus anterior as indicated (12/18/14)   Status On-going   PT LONG TERM GOAL #5  Title Patient will be independent with radial nerve glide as needed for radicular symptoms (12/18/14)   Status On-going               Plan - 12/07/14 1058    Clinical Impression Statement Pain significantly improved after last visit, but patient not sure if it was due to estim or taping but wanted to try both again today. Muscle tension much improved with only small localized trigger point in serratus today. Instructed patient in self-stretch for serratus anterior using foam roller and self-trigger point release using tennis ball standing against wall.   PT Next Visit Plan Ultrasound and STM/DTM to serratus anterior/latissimus dorsi, Postural exercises, Estim, taping PRN   Consulted and Agree with Plan of Care Patient        Problem List Patient Active Problem List   Diagnosis Date Noted  . Atypical chest pain 11/12/2014  . Back pain 11/12/2014   . Costochondritis 11/12/2014  . Allergic rhinitis 11/12/2014    [name], PT, MPT  12/07/2014, 11:15 AM  [facility] [address]  [place], [place], [address] Phone: [phone]   Fax:  [phone]

## 2014-12-10 ENCOUNTER — Ambulatory Visit: Payer: BLUE CROSS/BLUE SHIELD | Admitting: Rehabilitation

## 2014-12-10 DIAGNOSIS — M546 Pain in thoracic spine: Secondary | ICD-10-CM

## 2014-12-10 NOTE — Therapy (Signed)
[facility] [address]  [place], [place], [address] Phone: [phone]   Fax:  [phone]  Physical Therapy Treatment  Patient Details  Name: Lisa Ellis MRN: [id number] Date of Birth: 1975/11/06 Referring Provider:  [name], PA-C  Encounter Date: 12/10/2014      PT End of Session - 12/10/14 1025    Visit Number 6   Number of Visits 8   Date for PT Re-Evaluation 12/18/14   PT Start Time 1021   PT Stop Time 1116   PT Time Calculation (min) 55 min   Activity Tolerance Patient tolerated treatment well   Behavior During Therapy [facility] for tasks assessed/performed      Past Medical History  Diagnosis Date  . Allergy   . Asthma     Only wheezed on time associated with pneumonia. none since.  . Depression     only had brief episode in 2006 for couple of months when dad passed away.    Past Surgical History  Procedure Laterality Date  . Cholecystectomy    . Nasal sinus surgery    . Breast surgery      augmentation procedure.    There were no vitals filed for this visit.  Visit Diagnosis:  Left-sided thoracic back pain      Subjective Assessment - 12/10/14 1023    Subjective Reports her husband stressed her out but she is feeling better overall. Notices one instance of pain in her rib cage around to the front when she was playing with her dogs.     Currently in Pain? Yes   Pain Score --  1 or less   Pain Location Rib cage   Pain Orientation Left   Pain Descriptors / Indicators Dull;Aching     Today's Treatment  TherEx Hooklying on 6" foam roll - Chest stretch x2'  Bil Shoulder Horizontal Abduction with green TB 2x12, 3" hold Bil Shoulder ER with green TB 2x10, 3" hold  Flexion pullover with 4# 15x3"  Lt Serratus punch with  2# 12x3"   Modalities [place] 1.0 mHz, 20% DC, 1.0 W/cm2 x 8 minutes to left serratus anterior (prior to STM/DTM) Estim - IFC 40% scan, beat freq 80-150 Hz to Lt serratus anterior x 15 minutes, intensity to patient tolerance (12)  Manual Tx STM mobilization to Lt serratus anterior in Rt sidelying DTM/trigger point release to Lt serratus anterior in Rt sidelying Manual stretch to Lt serratus anterior in Rt sidelying 3x20"  Taping Single strip (5 blocks) to serratus anterior from scapula to lateral ribs, 50% stretch       PT Long Term Goals - 12/07/14 1102    PT LONG TERM GOAL #1   Title Patient will be independent with HEP (12/18/14)   Status On-going   PT LONG TERM GOAL #2   Title Patient will report pain at worst <4/10 in lateral thoracic region (12/18/14)   Status On-going   PT LONG TERM GOAL #3   Title Patient will report no sleep disturbance due to pain (12/18/14)   Status On-going   PT LONG TERM GOAL #4   Title Patient will be independent with self trigger point release for serratus anterior as indicated (12/18/14)   Status On-going   PT LONG TERM GOAL #5   Title Patient will be independent with radial nerve glide as needed for radicular symptoms (12/18/14)   Status On-going  Plan - 12/10/14 1103    Clinical Impression Statement Minimal trigger points noted today with good tolerance to exercises and increased reps. Continued with estim and taping today due to good improvement. Pt had noticed slight shoulder pain/instability the past few days but states it might be from stress. May begin switch to standing postural exercises to pt tolerance.    PT Next Visit Plan Ultrasound and STM/DTM to serratus anterior/latissimus dorsi, Postural exercises, Estim, taping PRN   Consulted and Agree with Plan of Care Patient        Problem List Patient Active Problem List   Diagnosis Date Noted  . Atypical chest pain 11/12/2014  .  Back pain 11/12/2014  . Costochondritis 11/12/2014  . Allergic rhinitis 11/12/2014    Barbette Hair, PTA 12/10/2014, 11:05 AM  [facility] [address]  [place], [place], [address] Phone: [phone]   Fax:  [phone]

## 2014-12-14 ENCOUNTER — Ambulatory Visit: Payer: BLUE CROSS/BLUE SHIELD | Attending: Medical | Admitting: Physical Therapy

## 2014-12-14 DIAGNOSIS — M546 Pain in thoracic spine: Secondary | ICD-10-CM | POA: Diagnosis present

## 2014-12-14 NOTE — Therapy (Signed)
[facility] [address]  [place], [place], [address] Phone: [phone]   Fax:  [phone]  Physical Therapy Treatment  Patient Details  Name: Lisa Ellis MRN: [id number] Date of Birth: 01-07-1976 Referring Provider:  [name], PA-C  Encounter Date: 12/14/2014      PT End of Session - 12/14/14 1112    Visit Number 7   Number of Visits 8   Date for PT Re-Evaluation 12/18/14   PT Start Time 1101   PT Stop Time 1155   PT Time Calculation (min) 54 min   Activity Tolerance Patient tolerated treatment well   Behavior During Therapy [facility] for tasks assessed/performed      Past Medical History  Diagnosis Date  . Allergy   . Asthma     Only wheezed on time associated with pneumonia. none since.  . Depression     only had brief episode in 2006 for couple of months when dad passed away.    Past Surgical History  Procedure Laterality Date  . Cholecystectomy    . Nasal sinus surgery    . Breast surgery      augmentation procedure.    There were no vitals filed for this visit.  Visit Diagnosis:  Left-sided thoracic back pain      Subjective Assessment - 12/14/14 1111    Subjective States work is still stressing her out, but denies pain today. Reports kinesio tape made her break-out the last time.   Currently in Pain? No/denies           Today's Treatment  TherEx Hooklying on 6" foam roll - Chest stretch x2'  Bil Shoulder Horizontal Abduction with blue TB 2x12, 3" hold Bil Shoulder ER with blue TB 2x10, 3" hold  Flexion pullover with 5# 15x3"  Lt Serratus punch with 3# 12x3" POE serratus push-up x10 Quadruped - cat/camel with emphasis on serratus x10 Serratus wall slides with orange (55 cm) Pball x10 Serratus wall slides  with green TB x10 TRX W-back row x10 TRX push-up x10 W-back row with green TB 10x3"   Modalities Estim - IFC 40% scan, beat freq 80-150 Hz to Lt serratus anterior x 10 minutes, intensity to patient tolerance (16)            PT Long Term Goals - 12/14/14 1116    PT LONG TERM GOAL #1   Title Patient will be independent with HEP (12/18/14)   Status On-going   PT LONG TERM GOAL #2   Title Patient will report pain at worst <4/10 in lateral thoracic region (12/18/14)   Status On-going   PT LONG TERM GOAL #3   Title Patient will report no sleep disturbance due to pain (12/18/14)   Status Achieved   PT LONG TERM GOAL #4   Title Patient will be independent with self trigger point release for serratus anterior as indicated (12/18/14)   Status On-going   PT LONG TERM GOAL #5   Title Patient will be independent with radial nerve glide as needed for radicular symptoms (12/18/14)   Status Achieved               Plan - 12/14/14 1156    Clinical Impression Statement Patient without pain today and able to tolerate progression to CKC and standing postural stabilization exercises without complaints. HEP updated with exercise progression. Given patient report of positive response to estim, discussed options for obtaining home TENS unit for use as needed for  pain. Patient nearing end of existing POC with good progress and positive response to therapy interventions. Will plan to reassess status at next visit and determine need for continued therapy vs 30 day hold vs discharge.    PT Next Visit Plan Review of updated HEP, reassess need for continued therapy vs 30 day hold vs discharge   Consulted and Agree with Plan of Care Patient        Problem List Patient Active Problem List   Diagnosis Date Noted  . Atypical chest pain 11/12/2014  . Back pain 11/12/2014  . Costochondritis 11/12/2014  . Allergic rhinitis 11/12/2014    [name], PT,  MPT 12/14/2014, 12:04 PM  [facility] [address]  [place], [place], [address] Phone: [phone]   Fax:  [phone]

## 2014-12-17 ENCOUNTER — Ambulatory Visit: Payer: BLUE CROSS/BLUE SHIELD | Admitting: Physical Therapy

## 2014-12-17 DIAGNOSIS — M546 Pain in thoracic spine: Secondary | ICD-10-CM | POA: Diagnosis not present

## 2014-12-17 NOTE — Therapy (Addendum)
[facility] [address]  [place], [place], [address] Phone: [phone]   Fax:  [phone]  Physical Therapy Treatment  Patient Details  Name: Lisa Ellis MRN: [id number] Date of Birth: Oct 16, 1975 Referring Provider:  [name], PA-C  Encounter Date: 12/17/2014      PT End of Session - 12/17/14 1137    PT Start Time 1100   PT Stop Time 1138   PT Time Calculation (min) 38 min   Activity Tolerance Patient tolerated treatment well   Behavior During Therapy [facility] for tasks assessed/performed      Past Medical History  Diagnosis Date  . Allergy   . Asthma     Only wheezed on time associated with pneumonia. none since.  . Depression     only had brief episode in 2006 for couple of months when dad passed away.    Past Surgical History  Procedure Laterality Date  . Cholecystectomy    . Nasal sinus surgery    . Breast surgery      augmentation procedure.    There were no vitals filed for this visit.  Visit Diagnosis:  Left-sided thoracic back pain      Subjective Assessment - 12/17/14 1104    Subjective Patient reports she is a little sore from increasing activity during workout, but denies pain like the pain that brought her to therapy.   Currently in Pain? No/denies            [facility] PT Assessment - 12/17/14 1100    Observation/Other Assessments   Focus on Therapeutic Outcomes (FOTO)  99% (1% limitation)   ROM / Strength   AROM / PROM / Strength AROM;Strength   AROM   Overall AROM  Within functional limits for tasks performed   AROM Assessment Site Cervical;Shoulder   Right/Left Shoulder Right;Left   Strength   Overall Strength Within functional limits for tasks performed   Strength Assessment Site Shoulder   Right/Left Shoulder Right;Left            Today's Treatment  TherEx Hooklying on 6" foam roll - Chest stretch x2'                                            Flexion  pullover with 5# 15x3"  Lt Serratus punch with 3# 12x3" Standing against 6" foam roll on wall - Bil Shoulder Horizontal Abduction with blue TB 2x12, 3" hold                    Bil Shoulder ER with blue TB 2x12, 3" hold POE serratus push-up x10 Quadruped - cat/camel with emphasis on serratus x10 Serratus wall slides with foam roller & green TB x15 W-back row with green TB 2x10, 3" hold          PT Long Term Goals - 12/17/14 1138    PT LONG TERM GOAL #1   Title Patient will be independent with HEP (12/18/14)   Status Achieved   PT LONG TERM GOAL #2   Title Patient will report pain at worst <4/10 in lateral thoracic region (12/18/14)   Status Achieved   PT LONG TERM GOAL #3   Title Patient will report no sleep disturbance due to pain (12/18/14)   Status Achieved   PT LONG TERM GOAL #4   Title Patient will  be independent with self trigger point release for serratus anterior as indicated (12/18/14)   Status Achieved  Patient has purchased a foam roller for home and is able to demonstrate appropriate use of roller for self-stretch/trigger point release   PT LONG TERM GOAL #5   Title Patient will be independent with radial nerve glide as needed for radicular symptoms (12/18/14)   Status Achieved               Plan - 12/17/14 1142    Clinical Impression Statement Patient has demonstrated excellent progress with PT and has been without pain in left serratus for ~1 week. Patient no longer with sleep disturbance related to pain. She is independent with HEP and has purchased a foam roller for home use with sefl-stretching and HEP exercises. All goals met at this time, and patient appears ready for transition to home program. Will place patient on hold x 30 days with patient to call for appt in event of flare-up or issues with home program. If no further needs within 30 days, will proceed with discharge  from PT for this episode.   PT Next Visit Plan 30 day hold, then D/C if no further needs   Consulted and Agree with Plan of Care Patient        Problem List Patient Active Problem List   Diagnosis Date Noted  . Atypical chest pain 11/12/2014  . Back pain 11/12/2014  . Costochondritis 11/12/2014  . Allergic rhinitis 11/12/2014    [name], PT, MPT 12/17/2014, 11:52 AM  [facility] [address]  [place], [place], [address] Phone: [phone]   Fax:  [phone]    PHYSICAL THERAPY DISCHARGE SUMMARY  Visits from Start of Care: 8  Current functional level related to goals / functional outcomes:  As of last visit, patient had demonstrated excellent progress with PT and had been without pain in left serratus for ~1 week. Patient no longer with sleep disturbance related to pain. She was independent with HEP and has purchased a foam roller for home use with sefl-stretching and HEP exercises. All goals were met and patient transitioned to home program, with PT placed on hold x 30 days with patient to call for appt in event of flare-up or issues with home program. No further visits scheduled within 30 days, therefore will proceed with discharge from PT for this episode.   Remaining deficits:  None   Education / Equipment:  Independent with HEP and patient has purchased a foam roller for home use with sefl-stretching and HEP exercises. Plan: Patient agrees to discharge.  Patient goals were not met. Patient is being discharged due to meeting the stated rehab goals.  ?????       [name], PT, MPT 01/21/2015, 11:50 AM  [facility] [address]  [place], [place], [address] Phone: [phone]   Fax:  [phone]

## 2014-12-22 ENCOUNTER — Ambulatory Visit (INDEPENDENT_AMBULATORY_CARE_PROVIDER_SITE_OTHER): Payer: BLUE CROSS/BLUE SHIELD | Admitting: Medical

## 2014-12-22 ENCOUNTER — Telehealth: Payer: Self-pay

## 2014-12-22 ENCOUNTER — Encounter: Payer: Self-pay | Admitting: Medical

## 2014-12-22 VITALS — BP 118/78 | HR 68 | Temp 97.9°F | Ht 67.5 in | Wt 215.8 lb

## 2014-12-22 DIAGNOSIS — Z0000 Encounter for general adult medical examination without abnormal findings: Secondary | ICD-10-CM | POA: Diagnosis not present

## 2014-12-22 DIAGNOSIS — Z23 Encounter for immunization: Secondary | ICD-10-CM | POA: Diagnosis not present

## 2014-12-22 HISTORY — DX: Encounter for general adult medical examination without abnormal findings: Z00.00

## 2014-12-22 LAB — COMPREHENSIVE METABOLIC PANEL
ALT: 17 U/L (ref 0–35)
AST: 16 U/L (ref 0–37)
Albumin: 4.3 g/dL (ref 3.5–5.2)
Alkaline Phosphatase: 71 U/L (ref 39–117)
BUN: 12 mg/dL (ref 6–23)
CO2: 28 meq/L (ref 19–32)
Calcium: 9.4 mg/dL (ref 8.4–10.5)
Chloride: 102 mEq/L (ref 96–112)
Creatinine, Ser: 0.82 mg/dL (ref 0.40–1.20)
GFR: 81.78 mL/min (ref >60.00)
Glucose, Bld: 97 mg/dL (ref 70–99)
POTASSIUM: 3.8 meq/L (ref 3.5–5.1)
Sodium: 138 mEq/L (ref 135–145)
Total Bilirubin: 0.5 mg/dL (ref 0.2–1.2)
Total Protein: 7.2 g/dL (ref 6.0–8.3)

## 2014-12-22 LAB — TSH: TSH: 0.56 u[IU]/mL (ref 0.35–4.50)

## 2014-12-22 LAB — LIPID PANEL
CHOLESTEROL: 171 mg/dL (ref 0–200)
HDL: 49 mg/dL (ref >39.00)
LDL CALC: 94 mg/dL (ref 0–99)
NonHDL: 121.79
TRIGLYCERIDES: 140 mg/dL (ref 0.0–149.0)
Total CHOL/HDL Ratio: 3
VLDL: 28 mg/dL (ref 0.0–40.0)

## 2014-12-22 LAB — POCT URINALYSIS DIPSTICK
BILIRUBIN UA: NEGATIVE
GLUCOSE UA: NEGATIVE
Ketones, UA: NEGATIVE
Leukocytes, UA: NEGATIVE
NITRITE UA: NEGATIVE
Protein, UA: NEGATIVE
RBC UA: NEGATIVE
SPEC GRAV UA: 1.015
Urobilinogen, UA: 0.2
pH, UA: 6

## 2014-12-22 LAB — CBC WITH DIFFERENTIAL/PLATELET
BASOS PCT: 0.8 % (ref 0.0–3.0)
Basophils Absolute: 0.1 10*3/uL (ref 0.0–0.1)
EOS ABS: 0.2 10*3/uL (ref 0.0–0.7)
Eosinophils Relative: 2 % (ref 0.0–5.0)
HCT: 45 % (ref 36.0–46.0)
Hemoglobin: 15 g/dL (ref 12.0–15.0)
LYMPHS PCT: 25.9 % (ref 12.0–46.0)
Lymphs Abs: 2.7 10*3/uL (ref 0.7–4.0)
MCHC: 33.4 g/dL (ref 30.0–36.0)
MCV: 93.4 fl (ref 78.0–100.0)
Monocytes Absolute: 0.7 10*3/uL (ref 0.1–1.0)
Monocytes Relative: 6.7 % (ref 3.0–12.0)
NEUTROS ABS: 6.8 10*3/uL (ref 1.4–7.7)
Neutrophils Relative %: 64.6 % (ref 43.0–77.0)
PLATELETS: 293 10*3/uL (ref 150.0–400.0)
RBC: 4.82 Mil/uL (ref 3.87–5.11)
RDW: 12.5 % (ref 11.5–15.5)
WBC: 10.5 10*3/uL (ref 4.0–10.5)

## 2014-12-22 NOTE — Progress Notes (Signed)
Pre visit review using our clinic review tool, if applicable. No additional management support is needed unless otherwise documented below in the visit note. 

## 2014-12-22 NOTE — Assessment & Plan Note (Addendum)
Cb,cmp, tsh, lipid panel. UA. Tdap given today.  Ask that you get your gynecologist to send [place] copies of last pap, mammogram or any other testing so we can scan those to our computer.  If you change you mind on flu vaccine let us know and we can give that vaccine.

## 2014-12-22 NOTE — Addendum Note (Signed)
Addended by: Anabel Halon on: 12/22/2014 11:41 AM   Modules accepted: Orders, SmartSet

## 2014-12-22 NOTE — Patient Instructions (Signed)
Wellness examination Cb,cmp, tsh, lipid panel. UA. Tdap given today.  Ask that you get your gynecologist to send [place] copies of last pap, mammogram or any other testing so we can scan those to our computer.  If you change you mind on flu vaccine let us know and we can give that vaccine.  Preventive Care for Adults, Female A healthy lifestyle and preventive care can promote health and wellness. Preventive health guidelines for women include the following key practices.  A routine yearly physical is a good way to check with your health care provider about your health and preventive screening. It is a chance to share any concerns and updates on your health and to receive a thorough exam.  Visit your dentist for a routine exam and preventive care every 6 months. Brush your teeth twice a day and floss once a day. Good oral hygiene prevents tooth decay and gum disease.  The frequency of eye exams is based on your age, health, family medical history, use of contact lenses, and other factors. Follow your health care provider's recommendations for frequency of eye exams.  Eat a healthy diet. Foods like vegetables, fruits, whole grains, low-fat dairy products, and lean protein foods contain the nutrients you need without too many calories. Decrease your intake of foods high in solid fats, added sugars, and salt. Eat the right amount of calories for you.Get information about a proper diet from your health care provider, if necessary.  Regular physical exercise is one of the most important things you can do for your health. Most adults should get at least 150 minutes of moderate-intensity exercise (any activity that increases your heart rate and causes you to sweat) each week. In addition, most adults need muscle-strengthening exercises on 2 or more days a week.  Maintain a healthy weight. The body mass index (BMI) is a screening tool to identify possible weight problems. It provides an estimate of body fat  based on height and weight. Your health care provider can find your BMI and can help you achieve or maintain a healthy weight.For adults 20 years and older:  A BMI below 18.5 is considered underweight.  A BMI of 18.5 to 24.9 is normal.  A BMI of 25 to 29.9 is considered overweight.  A BMI of 30 and above is considered obese.  Maintain normal blood lipids and cholesterol levels by exercising and minimizing your intake of saturated fat. Eat a balanced diet with plenty of fruit and vegetables. Blood tests for lipids and cholesterol should begin at age 96 and be repeated every 5 years. If your lipid or cholesterol levels are high, you are over 50, or you are at high risk for heart disease, you may need your cholesterol levels checked more frequently.Ongoing high lipid and cholesterol levels should be treated with medicines if diet and exercise are not working.  If you smoke, find out from your health care provider how to quit. If you do not use tobacco, do not start.  Lung cancer screening is recommended for adults aged 59-80 years who are at high risk for developing lung cancer because of a history of smoking. A yearly low-dose CT scan of the lungs is recommended for people who have at least a 30-pack-year history of smoking and are a current smoker or have quit within the past 15 years. A pack year of smoking is smoking an average of 1 pack of cigarettes a day for 1 year (for example: 1 pack a day for 30 years  or 2 packs a day for 15 years). Yearly screening should continue until the smoker has stopped smoking for at least 15 years. Yearly screening should be stopped for people who develop a health problem that would prevent them from having lung cancer treatment.  If you are pregnant, do not drink alcohol. If you are breastfeeding, be very cautious about drinking alcohol. If you are not pregnant and choose to drink alcohol, do not have more than 1 drink per day. One drink is considered to be 12  ounces (355 mL) of beer, 5 ounces (148 mL) of wine, or 1.5 ounces (44 mL) of liquor.  Avoid use of street drugs. Do not share needles with anyone. Ask for help if you need support or instructions about stopping the use of drugs.  High blood pressure causes heart disease and increases the risk of stroke. Your blood pressure should be checked at least every 1 to 2 years. Ongoing high blood pressure should be treated with medicines if weight loss and exercise do not work.  If you are 64-58 years old, ask your health care provider if you should take aspirin to prevent strokes.  Diabetes screening is done by taking a blood sample to check your blood glucose level after you have not eaten for a certain period of time (fasting). If you are not overweight and you do not have risk factors for diabetes, you should be screened once every 3 years starting at age 14. If you are overweight or obese and you are 72-48 years of age, you should be screened for diabetes every year as part of your cardiovascular risk assessment.  Breast cancer screening is essential preventive care for women. You should practice "breast self-awareness." This means understanding the normal appearance and feel of your breasts and may include breast self-examination. Any changes detected, no matter how small, should be reported to a health care provider. Women in their 73s and 30s should have a clinical breast exam (CBE) by a health care provider as part of a regular health exam every 1 to 3 years. After age 20, women should have a CBE every year. Starting at age 24, women should consider having a mammogram (breast X-ray test) every year. Women who have a family history of breast cancer should talk to their health care provider about genetic screening. Women at a high risk of breast cancer should talk to their health care providers about having an MRI and a mammogram every year.  Breast cancer gene (BRCA)-related cancer risk assessment is  recommended for women who have family members with BRCA-related cancers. BRCA-related cancers include breast, ovarian, tubal, and peritoneal cancers. Having family members with these cancers may be associated with an increased risk for harmful changes (mutations) in the breast cancer genes BRCA1 and BRCA2. Results of the assessment will determine the need for genetic counseling and BRCA1 and BRCA2 testing.  Your health care provider may recommend that you be screened regularly for cancer of the pelvic organs (ovaries, uterus, and vagina). This screening involves a pelvic examination, including checking for microscopic changes to the surface of your cervix (Pap test). You may be encouraged to have this screening done every 3 years, beginning at age 70.  For women ages 60-65, health care providers may recommend pelvic exams and Pap testing every 3 years, or they may recommend the Pap and pelvic exam, combined with testing for human papilloma virus (HPV), every 5 years. Some types of HPV increase your risk of cervical cancer. Testing  for HPV may also be done on women of any age with unclear Pap test results.  Other health care providers may not recommend any screening for nonpregnant women who are considered low risk for pelvic cancer and who do not have symptoms. Ask your health care provider if a screening pelvic exam is right for you.  If you have had past treatment for cervical cancer or a condition that could lead to cancer, you need Pap tests and screening for cancer for at least 20 years after your treatment. If Pap tests have been discontinued, your risk factors (such as having a new sexual partner) need to be reassessed to determine if screening should resume. Some women have medical problems that increase the chance of getting cervical cancer. In these cases, your health care provider may recommend more frequent screening and Pap tests.  Colorectal cancer can be detected and often prevented. Most  routine colorectal cancer screening begins at the age of 37 years and continues through age 74 years. However, your health care provider may recommend screening at an earlier age if you have risk factors for colon cancer. On a yearly basis, your health care provider may provide home test kits to check for hidden blood in the stool. Use of a small camera at the end of a tube, to directly examine the colon (sigmoidoscopy or colonoscopy), can detect the earliest forms of colorectal cancer. Talk to your health care provider about this at age 63, when routine screening begins. Direct exam of the colon should be repeated every 5-10 years through age 59 years, unless early forms of precancerous polyps or small growths are found.  People who are at an increased risk for hepatitis B should be screened for this virus. You are considered at high risk for hepatitis B if:  You were born in a country where hepatitis B occurs often. Talk with your health care provider about which countries are considered high risk.  Your parents were born in a high-risk country and you have not received a shot to protect against hepatitis B (hepatitis B vaccine).  You have HIV or AIDS.  You use needles to inject street drugs.  You live with, or have sex with, someone who has hepatitis B.  You get hemodialysis treatment.  You take certain medicines for conditions like cancer, organ transplantation, and autoimmune conditions.  Hepatitis C blood testing is recommended for all people born from [birth year] through 1965 and any individual with known risks for hepatitis C.  Practice safe sex. Use condoms and avoid high-risk sexual practices to reduce the spread of sexually transmitted infections (STIs). STIs include gonorrhea, chlamydia, syphilis, trichomonas, herpes, HPV, and human immunodeficiency virus (HIV). Herpes, HIV, and HPV are viral illnesses that have no cure. They can result in disability, cancer, and death.  You should be  screened for sexually transmitted illnesses (STIs) including gonorrhea and chlamydia if:  You are sexually active and are younger than 24 years.  You are older than 24 years and your health care provider tells you that you are at risk for this type of infection.  Your sexual activity has changed since you were last screened and you are at an increased risk for chlamydia or gonorrhea. Ask your health care provider if you are at risk.  If you are at risk of being infected with HIV, it is recommended that you take a prescription medicine daily to prevent HIV infection. This is called preexposure prophylaxis (PrEP). You are considered at risk if:  You are sexually active and do not regularly use condoms or know the HIV status of your partner(s).  You take drugs by injection.  You are sexually active with a partner who has HIV.  Talk with your health care provider about whether you are at high risk of being infected with HIV. If you choose to begin PrEP, you should first be tested for HIV. You should then be tested every 3 months for as long as you are taking PrEP.  Osteoporosis is a disease in which the bones lose minerals and strength with aging. This can result in serious bone fractures or breaks. The risk of osteoporosis can be identified using a bone density scan. Women ages 73 years and over and women at risk for fractures or osteoporosis should discuss screening with their health care providers. Ask your health care provider whether you should take a calcium supplement or vitamin D to reduce the rate of osteoporosis.  Menopause can be associated with physical symptoms and risks. Hormone replacement therapy is available to decrease symptoms and risks. You should talk to your health care provider about whether hormone replacement therapy is right for you.  Use sunscreen. Apply sunscreen liberally and repeatedly throughout the day. You should seek shade when your shadow is shorter than you.  Protect yourself by wearing long sleeves, pants, a wide-brimmed hat, and sunglasses year round, whenever you are outdoors.  Once a month, do a whole body skin exam, using a mirror to look at the skin on your back. Tell your health care provider of new moles, moles that have irregular borders, moles that are larger than a pencil eraser, or moles that have changed in shape or color.  Stay current with required vaccines (immunizations).  Influenza vaccine. All adults should be immunized every year.  Tetanus, diphtheria, and acellular pertussis (Td, Tdap) vaccine. Pregnant women should receive 1 dose of Tdap vaccine during each pregnancy. The dose should be obtained regardless of the length of time since the last dose. Immunization is preferred during the 27th-36th week of gestation. An adult who has not previously received Tdap or who does not know her vaccine status should receive 1 dose of Tdap. This initial dose should be followed by tetanus and diphtheria toxoids (Td) booster doses every 10 years. Adults with an unknown or incomplete history of completing a 3-dose immunization series with Td-containing vaccines should begin or complete a primary immunization series including a Tdap dose. Adults should receive a Td booster every 10 years.  Varicella vaccine. An adult without evidence of immunity to varicella should receive 2 doses or a second dose if she has previously received 1 dose. Pregnant females who do not have evidence of immunity should receive the first dose after pregnancy. This first dose should be obtained before leaving the health care facility. The second dose should be obtained 4-8 weeks after the first dose.  Human papillomavirus (HPV) vaccine. Females aged 13-26 years who have not received the vaccine previously should obtain the 3-dose series. The vaccine is not recommended for use in pregnant females. However, pregnancy testing is not needed before receiving a dose. If a female is  found to be pregnant after receiving a dose, no treatment is needed. In that case, the remaining doses should be delayed until after the pregnancy. Immunization is recommended for any person with an immunocompromised condition through the age of 46 years if she did not get any or all doses earlier. During the 3-dose series, the second dose should  be obtained 4-8 weeks after the first dose. The third dose should be obtained 24 weeks after the first dose and 16 weeks after the second dose.  Zoster vaccine. One dose is recommended for adults aged 10 years or older unless certain conditions are present.  Measles, mumps, and rubella (MMR) vaccine. Adults born before [birth year] generally are considered immune to measles and mumps. Adults born in [birth year] or later should have 1 or more doses of MMR vaccine unless there is a contraindication to the vaccine or there is laboratory evidence of immunity to each of the three diseases. A routine second dose of MMR vaccine should be obtained at least 28 days after the first dose for students attending postsecondary schools, health care workers, or international travelers. People who received inactivated measles vaccine or an unknown type of measles vaccine during 1963-1967 should receive 2 doses of MMR vaccine. People who received inactivated mumps vaccine or an unknown type of mumps vaccine before 1979 and are at high risk for mumps infection should consider immunization with 2 doses of MMR vaccine. For females of childbearing age, rubella immunity should be determined. If there is no evidence of immunity, females who are not pregnant should be vaccinated. If there is no evidence of immunity, females who are pregnant should delay immunization until after pregnancy. Unvaccinated health care workers born before [birth year] who lack laboratory evidence of measles, mumps, or rubella immunity or laboratory confirmation of disease should consider measles and mumps immunization with 2 doses of MMR  vaccine or rubella immunization with 1 dose of MMR vaccine.  Pneumococcal 13-valent conjugate (PCV13) vaccine. When indicated, a person who is uncertain of his immunization history and has no record of immunization should receive the PCV13 vaccine. All adults 17 years of age and older should receive this vaccine. An adult aged 49 years or older who has certain medical conditions and has not been previously immunized should receive 1 dose of PCV13 vaccine. This PCV13 should be followed with a dose of pneumococcal polysaccharide (PPSV23) vaccine. Adults who are at high risk for pneumococcal disease should obtain the PPSV23 vaccine at least 8 weeks after the dose of PCV13 vaccine. Adults older than 40 years of age who have normal immune system function should obtain the PPSV23 vaccine dose at least 1 year after the dose of PCV13 vaccine.  Pneumococcal polysaccharide (PPSV23) vaccine. When PCV13 is also indicated, PCV13 should be obtained first. All adults aged 46 years and older should be immunized. An adult younger than age 60 years who has certain medical conditions should be immunized. Any person who resides in a nursing home or long-term care facility should be immunized. An adult smoker should be immunized. People with an immunocompromised condition and certain other conditions should receive both PCV13 and PPSV23 vaccines. People with human immunodeficiency virus (HIV) infection should be immunized as soon as possible after diagnosis. Immunization during chemotherapy or radiation therapy should be avoided. Routine use of PPSV23 vaccine is not recommended for American Indians, [place] Natives, or people younger than 65 years unless there are medical conditions that require PPSV23 vaccine. When indicated, people who have unknown immunization and have no record of immunization should receive PPSV23 vaccine. One-time revaccination 5 years after the first dose of PPSV23 is recommended for people aged 19-64 years  who have chronic kidney failure, nephrotic syndrome, asplenia, or immunocompromised conditions. People who received 1-2 doses of PPSV23 before age 70 years should receive another dose of PPSV23 vaccine at age 64 years or  later if at least 5 years have passed since the previous dose. Doses of PPSV23 are not needed for people immunized with PPSV23 at or after age 80 years.  Meningococcal vaccine. Adults with asplenia or persistent complement component deficiencies should receive 2 doses of quadrivalent meningococcal conjugate (MenACWY-D) vaccine. The doses should be obtained at least 2 months apart. Microbiologists working with certain meningococcal bacteria, [place] recruits, people at risk during an outbreak, and people who travel to or live in countries with a high rate of meningitis should be immunized. A first-year college student up through age 55 years who is living in a residence hall should receive a dose if she did not receive a dose on or after her 16th birthday. Adults who have certain high-risk conditions should receive one or more doses of vaccine.  Hepatitis A vaccine. Adults who wish to be protected from this disease, have certain high-risk conditions, work with hepatitis A-infected animals, work in hepatitis A research labs, or travel to or work in countries with a high rate of hepatitis A should be immunized. Adults who were previously unvaccinated and who anticipate close contact with an international adoptee during the first 60 days after arrival in the [place] from a country with a high rate of hepatitis A should be immunized.  Hepatitis B vaccine. Adults who wish to be protected from this disease, have certain high-risk conditions, may be exposed to blood or other infectious body fluids, are household contacts or sex partners of hepatitis B positive people, are clients or workers in certain care facilities, or travel to or work in countries with a high rate of hepatitis B should be  immunized.  Haemophilus influenzae type b (Hib) vaccine. A previously unvaccinated person with asplenia or sickle cell disease or having a scheduled splenectomy should receive 1 dose of Hib vaccine. Regardless of previous immunization, a recipient of a hematopoietic stem cell transplant should receive a 3-dose series 6-12 months after her successful transplant. Hib vaccine is not recommended for adults with HIV infection. Preventive Services / Frequency Ages 44 to 34 years  Blood pressure check.** / Every 3-5 years.  Lipid and cholesterol check.** / Every 5 years beginning at age 68.  Clinical breast exam.** / Every 3 years for women in their 2s and 38s.  BRCA-related cancer risk assessment.** / For women who have family members with a BRCA-related cancer (breast, ovarian, tubal, or peritoneal cancers).  Pap test.** / Every 2 years from ages 70 through 13. Every 3 years starting at age 37 through age 39 or 53 with a history of 3 consecutive normal Pap tests.  HPV screening.** / Every 3 years from ages 25 through ages 73 to 66 with a history of 3 consecutive normal Pap tests.  Hepatitis C blood test.** / For any individual with known risks for hepatitis C.  Skin self-exam. / Monthly.  Influenza vaccine. / Every year.  Tetanus, diphtheria, and acellular pertussis (Tdap, Td) vaccine.** / Consult your health care provider. Pregnant women should receive 1 dose of Tdap vaccine during each pregnancy. 1 dose of Td every 10 years.  Varicella vaccine.** / Consult your health care provider. Pregnant females who do not have evidence of immunity should receive the first dose after pregnancy.  HPV vaccine. / 3 doses over 6 months, if 51 and younger. The vaccine is not recommended for use in pregnant females. However, pregnancy testing is not needed before receiving a dose.  Measles, mumps, rubella (MMR) vaccine.** / You need at  least 1 dose of MMR if you were born in 1957 or later. You may also need  a 2nd dose. For females of childbearing age, rubella immunity should be determined. If there is no evidence of immunity, females who are not pregnant should be vaccinated. If there is no evidence of immunity, females who are pregnant should delay immunization until after pregnancy.  Pneumococcal 13-valent conjugate (PCV13) vaccine.** / Consult your health care provider.  Pneumococcal polysaccharide (PPSV23) vaccine.** / 1 to 2 doses if you smoke cigarettes or if you have certain conditions.  Meningococcal vaccine.** / 1 dose if you are age 95 to 16 years and a Market researcher living in a residence hall, or have one of several medical conditions, you need to get vaccinated against meningococcal disease. You may also need additional booster doses.  Hepatitis A vaccine.** / Consult your health care provider.  Hepatitis B vaccine.** / Consult your health care provider.  Haemophilus influenzae type b (Hib) vaccine.** / Consult your health care provider. Ages 47 to 61 years  Blood pressure check.** / Every year.  Lipid and cholesterol check.** / Every 5 years beginning at age 54 years.  Lung cancer screening. / Every year if you are aged 68-80 years and have a 30-pack-year history of smoking and currently smoke or have quit within the past 15 years. Yearly screening is stopped once you have quit smoking for at least 15 years or develop a health problem that would prevent you from having lung cancer treatment.  Clinical breast exam.** / Every year after age 42 years.  BRCA-related cancer risk assessment.** / For women who have family members with a BRCA-related cancer (breast, ovarian, tubal, or peritoneal cancers).  Mammogram.** / Every year beginning at age 53 years and continuing for as long as you are in good health. Consult with your health care provider.  Pap test.** / Every 3 years starting at age 66 years through age 75 or 62 years with a history of 3 consecutive normal Pap  tests.  HPV screening.** / Every 3 years from ages 77 years through ages 36 to 52 years with a history of 3 consecutive normal Pap tests.  Fecal occult blood test (FOBT) of stool. / Every year beginning at age 73 years and continuing until age 25 years. You may not need to do this test if you get a colonoscopy every 10 years.  Flexible sigmoidoscopy or colonoscopy.** / Every 5 years for a flexible sigmoidoscopy or every 10 years for a colonoscopy beginning at age 75 years and continuing until age 11 years.  Hepatitis C blood test.** / For all people born from [birth year] through 1966 and any individual with known risks for hepatitis C.  Skin self-exam. / Monthly.  Influenza vaccine. / Every year.  Tetanus, diphtheria, and acellular pertussis (Tdap/Td) vaccine.** / Consult your health care provider. Pregnant women should receive 1 dose of Tdap vaccine during each pregnancy. 1 dose of Td every 10 years.  Varicella vaccine.** / Consult your health care provider. Pregnant females who do not have evidence of immunity should receive the first dose after pregnancy.  Zoster vaccine.** / 1 dose for adults aged 28 years or older.  Measles, mumps, rubella (MMR) vaccine.** / You need at least 1 dose of MMR if you were born in 1957 or later. You may also need a second dose. For females of childbearing age, rubella immunity should be determined. If there is no evidence of immunity, females who are not pregnant should  be vaccinated. If there is no evidence of immunity, females who are pregnant should delay immunization until after pregnancy.  Pneumococcal 13-valent conjugate (PCV13) vaccine.** / Consult your health care provider.  Pneumococcal polysaccharide (PPSV23) vaccine.** / 1 to 2 doses if you smoke cigarettes or if you have certain conditions.  Meningococcal vaccine.** / Consult your health care provider.  Hepatitis A vaccine.** / Consult your health care provider.  Hepatitis B vaccine.** / Consult  your health care provider.  Haemophilus influenzae type b (Hib) vaccine.** / Consult your health care provider. Ages 5 years and over  Blood pressure check.** / Every year.  Lipid and cholesterol check.** / Every 5 years beginning at age 12 years.  Lung cancer screening. / Every year if you are aged 32-80 years and have a 30-pack-year history of smoking and currently smoke or have quit within the past 15 years. Yearly screening is stopped once you have quit smoking for at least 15 years or develop a health problem that would prevent you from having lung cancer treatment.  Clinical breast exam.** / Every year after age 22 years.  BRCA-related cancer risk assessment.** / For women who have family members with a BRCA-related cancer (breast, ovarian, tubal, or peritoneal cancers).  Mammogram.** / Every year beginning at age 55 years and continuing for as long as you are in good health. Consult with your health care provider.  Pap test.** / Every 3 years starting at age 85 years through age 37 or 69 years with 3 consecutive normal Pap tests. Testing can be stopped between 65 and 70 years with 3 consecutive normal Pap tests and no abnormal Pap or HPV tests in the past 10 years.  HPV screening.** / Every 3 years from ages 24 years through ages 69 or 91 years with a history of 3 consecutive normal Pap tests. Testing can be stopped between 65 and 70 years with 3 consecutive normal Pap tests and no abnormal Pap or HPV tests in the past 10 years.  Fecal occult blood test (FOBT) of stool. / Every year beginning at age 69 years and continuing until age 93 years. You may not need to do this test if you get a colonoscopy every 10 years.  Flexible sigmoidoscopy or colonoscopy.** / Every 5 years for a flexible sigmoidoscopy or every 10 years for a colonoscopy beginning at age 11 years and continuing until age 66 years.  Hepatitis C blood test.** / For all people born from [birth year] through 1965 and any  individual with known risks for hepatitis C.  Osteoporosis screening.** / A one-time screening for women ages 70 years and over and women at risk for fractures or osteoporosis.  Skin self-exam. / Monthly.  Influenza vaccine. / Every year.  Tetanus, diphtheria, and acellular pertussis (Tdap/Td) vaccine.** / 1 dose of Td every 10 years.  Varicella vaccine.** / Consult your health care provider.  Zoster vaccine.** / 1 dose for adults aged 40 years or older.  Pneumococcal 13-valent conjugate (PCV13) vaccine.** / Consult your health care provider.  Pneumococcal polysaccharide (PPSV23) vaccine.** / 1 dose for all adults aged 67 years and older.  Meningococcal vaccine.** / Consult your health care provider.  Hepatitis A vaccine.** / Consult your health care provider.  Hepatitis B vaccine.** / Consult your health care provider.  Haemophilus influenzae type b (Hib) vaccine.** / Consult your health care provider. ** Family history and personal history of risk and conditions may change your health care provider's recommendations.   This information is not  intended to replace advice given to you by your health care provider. Make sure you discuss any questions you have with your health care provider.   Document Released: 04/25/2001 Document Revised: 03/20/2014 Document Reviewed: 07/25/2010 Elsevier Interactive Patient Education [employer].

## 2014-12-22 NOTE — Progress Notes (Signed)
Subjective:    Patient ID: Lisa Ellis, female    DOB: 1975-09-07, 39 y.o.   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 More high protein diet. Married- no children  Flu- Pt states she does not take flu vaccines. Pt need t-dap per our chart. She is ok with tdap. Mammogram- August of this past year. Normal. Last pap- Pt last pap smear was normal. She had exam in July 2016. Inspection was normal with gyn Screening std-pt not sure if those were done.(pt may have had with Gynecologist). No family history of colon CA.    Review of Systems  Constitutional: Negative for fever, chills, diaphoresis, activity change and fatigue.  Respiratory: Negative for cough, chest tightness and shortness of breath.   Cardiovascular: Negative for chest pain, palpitations and leg swelling.  Gastrointestinal: Negative for nausea, vomiting, abdominal pain, diarrhea, constipation, blood in stool and abdominal distention.       Rare occasioanl bubble sensation to epigastric area. Occasional will have dry cough with this.  Musculoskeletal: Negative for neck pain and neck stiffness.  Neurological: Negative for dizziness, tremors, seizures, syncope, facial asymmetry, speech difficulty, weakness, light-headedness, numbness and headaches.  Psychiatric/Behavioral: Negative for behavioral problems, confusion and agitation. The patient is not nervous/anxious.     Past Medical History  Diagnosis Date  . Allergy   . Asthma     Only wheezed on time associated with pneumonia. none since.  . Depression     only had brief episode in 2006 for couple of months when dad passed away.    Social History   Social History  . Marital Status: Unknown    Spouse Name: N/A  . Number of Children: N/A  . Years of Education: N/A    Occupational History  . Not on file.   Social History Main Topics  . Smoking status: Never Smoker   . Smokeless tobacco: Never Used  . Alcohol Use: No  . Drug Use: No  . Sexual Activity: Yes   Other Topics Concern  . Not on file   Social History Narrative    Past Surgical History  Procedure Laterality Date  . Cholecystectomy    . Nasal sinus surgery    . Breast surgery      augmentation procedure.    Family History  Problem Relation Age of Onset  . Hypertension Mother   . Diabetes Father   . Hypertension Maternal Grandfather   . Diabetes Paternal Grandmother     No Known Allergies  No current outpatient prescriptions on file prior to visit.   No current facility-administered medications on file prior to visit.    BP 118/78 mmHg  Pulse 68  Temp(Src) 97.9 F (36.6 C) (Oral)  Ht 5' 7.5" (1.715 m)  Wt 215 lb 12.8 oz (97.886 kg)  BMI 33.28 kg/m2  SpO2 98%       Objective:   Physical Exam  General   Mental Status- Alert.  Orientation-Oriented x3. Build and Nutrition Well Nourished and Well Developed.  Skin General: Normal.  Color- Normal color. Moisture- Dry.Temperature warm. Lesions: No suspicious lesions   HEENT Head- Normal. Ear Auditory Canal - Left- Normal. Right - Normal.Tympanic Membrane- Left- Normal. Right- Normal. Eye Sclera/Conjunctiva- Left- Normal. Right- Normal. Nose & Sinuses Nasal Mucosa- Left-  Not oggy  or Congested. Right-  Not  boggy or Congested. Mouth & Throat Lips: Upper Lip- Normal: no dryness, cracking, pallor, cyanosis, or vesicular eruption. Lower Lip-Normal: no dryness, cracking, pallor, cyanosis or vesicular eruption. Buccal Mucosa- Bilateral- No Aphthous ulcers. Oropharynx- No Discharge or Erythema. Tonsils: Characteristics- Bilateral- No Erythema or Congestion. Size/Enlargement- Bilateral- No enlargement. Discharge- bilateral-None.    Chest and Lung Exam Auscultation: Breath Sounds:- even and unlabored, but  bilateral upper lobe rhonchi.  Cardiovascular Auscultation:Rythm- Regular, rate and rhythm. Murmurs & Other Heart Sounds:Ausculatation of the heart reveal- No Murmurs.   Breast Pt sees gyn.  Vaginal Pt sees gyn.  Cardiovascular Inspection: No Heaves. Auscultation: Heart Sounds- Normal sinus rhythm without murmur or gallop, S1 WNL and S2 WNL.  Abdomen Inspection:- Inspection Normal. Inspection of abdomen reveals- No Hernias. Palpation/Percussion: Palpation and Percussion of the abdomen reveal- Non Tender and No Palpable masses. Liver: Other Characteristics- No Hepatmegaly Spleen:Other Characteristics- No Splenomegaly. Auscultation: Auscultation of the abdomen reveals-Bowel sounds normal and No Abdominal bruits.   Neurologic Mental Status- Normal Cranial Nerves- Normal Bilaterally, Motor- Normal. Strength:5/5 normal muscle strength- All Muscles. General Assessment of Reflexes- Right Knee- 2+. Left Knee- 2+. Coordination- Normal. Gait- Normal. Meningeal Signs- None.  Musculoskeletal Global Assessment General- Joints show full range of motion without obvious deformity and Normal muscle mass. Strength 5/5 in upper and lower extremities.  Lymphatic General lymphatics Description-No Generalized lymphadenopathy.         Assessment & Plan:  Regarding your bubble epigastric sensation with occasional cough this may be reflux. When occurs occasionally could use zantac otc.

## 2014-12-22 NOTE — Addendum Note (Signed)
Addended by: Tasia Catchings on: 12/22/2014 11:21 AM   Modules accepted: Orders

## 2014-12-22 NOTE — Telephone Encounter (Signed)
Left pt a message to call back and set up a lab appointment. She will need to be fasting. Pt will need to be fasting. I will put future orders in the system.

## 2015-01-07 ENCOUNTER — Telehealth: Payer: Self-pay | Admitting: Medical

## 2015-01-07 ENCOUNTER — Telehealth: Payer: Self-pay | Admitting: *Deleted

## 2015-01-07 ENCOUNTER — Other Ambulatory Visit: Payer: Self-pay | Admitting: Medical

## 2015-01-07 NOTE — Telephone Encounter (Signed)
Medical records received from [facility] [place]. Forwarded to [employer]. JG//CMA

## 2015-01-07 NOTE — Telephone Encounter (Signed)
Pt says that her insurance requested her medical records from our office, she would like to check the status of things. She says that she have a claim open with them and need them to have access to her records.   Provided pt the number to Medical Records.

## 2015-01-15 ENCOUNTER — Ambulatory Visit (INDEPENDENT_AMBULATORY_CARE_PROVIDER_SITE_OTHER): Payer: BLUE CROSS/BLUE SHIELD | Admitting: Psychology

## 2015-01-15 DIAGNOSIS — F4323 Adjustment disorder with mixed anxiety and depressed mood: Secondary | ICD-10-CM | POA: Diagnosis not present

## 2015-02-01 ENCOUNTER — Ambulatory Visit (INDEPENDENT_AMBULATORY_CARE_PROVIDER_SITE_OTHER): Payer: BLUE CROSS/BLUE SHIELD | Admitting: Psychology

## 2015-02-01 DIAGNOSIS — F4323 Adjustment disorder with mixed anxiety and depressed mood: Secondary | ICD-10-CM

## 2015-02-16 ENCOUNTER — Ambulatory Visit (INDEPENDENT_AMBULATORY_CARE_PROVIDER_SITE_OTHER): Payer: BLUE CROSS/BLUE SHIELD | Admitting: Medical

## 2015-02-16 ENCOUNTER — Encounter: Payer: Self-pay | Admitting: Medical

## 2015-02-16 VITALS — BP 125/77 | HR 89 | Temp 98.1°F | Ht 67.5 in | Wt 221.4 lb

## 2015-02-16 DIAGNOSIS — M542 Cervicalgia: Secondary | ICD-10-CM

## 2015-02-16 MED ORDER — DICLOFENAC SODIUM 75 MG PO TBEC: 75.0000 mg | DELAYED_RELEASE_TABLET | Freq: Two times a day (BID) | ORAL | Status: DC

## 2015-02-16 MED ORDER — TIZANIDINE HCL 4 MG PO TABS: 4.0000 mg | ORAL_TABLET | Freq: Every day | ORAL | Status: DC

## 2015-02-16 MED ORDER — TRAZODONE HCL 50 MG PO TABS: 25.0000 mg | ORAL_TABLET | Freq: Every evening | ORAL | Status: DC | PRN

## 2015-02-16 NOTE — Patient Instructions (Addendum)
With your neck pain I think you have trapezius pain with some tension ha. Would recommend massage if possible. Will rx zanaflex muscle relaxant. Hold flexeril for now. Also start diclofenac.  If you get ha with stiff neck or increasing severe ha then ED evaluation.  For insmonia will rx trazadone.   Follow up in 1 month or as needed.

## 2015-02-16 NOTE — Progress Notes (Signed)
Pre visit review using our clinic review tool, if applicable. No additional management support is needed unless otherwise documented below in the visit note. 

## 2015-02-16 NOTE — Progress Notes (Signed)
Subjective:    Patient ID: Lisa Ellis, female    DOB: November 10, 1975, 39 y.o.   MRN: [id number]  HPI   Pt in with some neck pain on both side at base. Points to upper trapezius area. Pt states tight muscles in neck seems to cause faint headache. Some bilateral temporal ha at night.  Pt symptoms for about 2 wks. No exercise. No car accidents no whip lash injury. No nausea, no vomiting, and no neck stiffness.  Pt also states waking up mid night every day around 3 am.(for about a year) Pt seemed stressed on last exam with work. But she states things seem under control now. Some trouble falling asleep for about a year as well. Pt tried some cyclobenzaprine last night at 7 pm and could not go to sleep. Over year sleep disturbance.   Review of Systems  Constitutional: Negative for fever, chills, diaphoresis, activity change and fatigue.  Respiratory: Negative for cough, chest tightness and shortness of breath.   Cardiovascular: Negative for chest pain, palpitations and leg swelling.  Gastrointestinal: Negative for nausea, vomiting and abdominal pain.  Musculoskeletal: Negative for neck pain and neck stiffness.       Occasional on and off upper back soreness. For one year.  Skin: Negative for rash.  Neurological: Negative for dizziness, tremors, seizures, syncope, facial asymmetry, speech difficulty, weakness, light-headedness, numbness and headaches.  Psychiatric/Behavioral: Negative for behavioral problems, confusion, dysphoric mood and agitation. The patient is not nervous/anxious.        Maybe stress/tension related to work.   lmp- currently.  Past Medical History  Diagnosis Date  . Allergy   . Asthma     Only wheezed on time associated with pneumonia. none since.  . Depression     only had brief episode in 2006 for couple of months when dad passed away.    Social History   Social History  . Marital Status: Unknown    Spouse Name: N/A  . Number of Children: N/A  . Years of  Education: N/A   Occupational History  . Not on file.   Social History Main Topics  . Smoking status: Never Smoker   . Smokeless tobacco: Never Used  . Alcohol Use: No  . Drug Use: No  . Sexual Activity: Yes   Other Topics Concern  . Not on file   Social History Narrative    Past Surgical History  Procedure Laterality Date  . Cholecystectomy    . Nasal sinus surgery    . Breast surgery      augmentation procedure.    Family History  Problem Relation Age of Onset  . Hypertension Mother   . Diabetes Father   . Hypertension Maternal Grandfather   . Diabetes Paternal Grandmother     No Known Allergies  No current outpatient prescriptions on file prior to visit.   No current facility-administered medications on file prior to visit.    BP 125/77 mmHg  Pulse 89  Temp(Src) 98.1 F (36.7 C) (Oral)  Ht 5' 7.5" (1.715 m)  Wt 221 lb 6.4 oz (100.426 kg)  BMI 34.14 kg/m2  SpO2 100%  LMP 02/16/2015       Objective:   Physical Exam   General Mental Status- Alert. General Appearance- Not in acute distress.   Skin General: Color- Normal Color. Moisture- Normal Moisture. Over temporal areas no dilated veins or tenderness.  Neck Carotid Arteries- Normal color. Moisture- Normal Moisture. No carotid bruits. No JVD. Full rom of motion.  NO rigidity. Upper trapezius base of posterior scalp mild tender. No mid cspine tender.  Chest and Lung Exam Auscultation: Breath Sounds:-Normal. CTA.  Cardiovascular Auscultation:Rythm- Regular, Rate and Rhythm. Murmurs & Other Heart Sounds:Auscultation of the heart reveals- No Murmurs.  Abdomen Inspection:-Inspeection Normal. Palpation/Percussion:Note:No mass. Palpation and Percussion of the abdomen reveal- Non Tender, Non Distended + BS, no rebound or guarding.  Neurologic Cranial Nerve exam:- CN III-XII intact(No nystagmus), symmetric smile. Drift Test:- No drift. Finger to Nose:- Normal/Intact Strength:- 5/5 equal and  symmetric strength both upper and lower extremities.     Assessment & Plan:  With your neck pain I think you have trapezius pain with some tension ha. Would recommend massage if possible. Will rx zanaflex muscle relaxant. Hold flexeril for now. Also start diclofenac.  If you get ha with stiff neck or increasing severe ha then ED evaluation.  For insmonia will rx trazadone.   Follow up in 1 month or as needed.

## 2015-02-24 ENCOUNTER — Ambulatory Visit (INDEPENDENT_AMBULATORY_CARE_PROVIDER_SITE_OTHER): Payer: BLUE CROSS/BLUE SHIELD | Admitting: Psychology

## 2015-02-24 DIAGNOSIS — F4323 Adjustment disorder with mixed anxiety and depressed mood: Secondary | ICD-10-CM

## 2015-03-04 ENCOUNTER — Telehealth: Payer: Self-pay

## 2015-03-04 NOTE — Telephone Encounter (Signed)
Error

## 2015-03-18 ENCOUNTER — Encounter: Payer: Self-pay | Admitting: Medical

## 2015-03-18 ENCOUNTER — Ambulatory Visit (INDEPENDENT_AMBULATORY_CARE_PROVIDER_SITE_OTHER): Payer: 59 | Admitting: Medical

## 2015-03-18 VITALS — BP 118/78 | HR 81 | Temp 98.0°F | Ht 67.5 in | Wt 222.0 lb

## 2015-03-18 DIAGNOSIS — G4700 Insomnia, unspecified: Secondary | ICD-10-CM | POA: Diagnosis not present

## 2015-03-18 DIAGNOSIS — Z566 Other physical and mental strain related to work: Secondary | ICD-10-CM | POA: Diagnosis not present

## 2015-03-18 DIAGNOSIS — M542 Cervicalgia: Secondary | ICD-10-CM | POA: Diagnosis not present

## 2015-03-18 DIAGNOSIS — E669 Obesity, unspecified: Secondary | ICD-10-CM

## 2015-03-18 MED ORDER — ZOLPIDEM TARTRATE 5 MG PO TABS: 5.0000 mg | ORAL_TABLET | Freq: Every evening | ORAL | Status: DC | PRN

## 2015-03-18 NOTE — Patient Instructions (Addendum)
Your neck pain appears to be stress related(improved now). Continue exercise for stress relief and muscle relaxant if needed periodically.   For insomnia continue trazadone. Can use ambien periodically as well if you have consecutive run of poor sleep.    For obesity recommend calorie restriction or weight watcher along with exercise program.   Follow up in one month by my chart regarding weight loss and insomnia. Then decide on actual office visit.  Flu vaccine decline.

## 2015-03-18 NOTE — Progress Notes (Signed)
Pre visit review using our clinic review tool, if applicable. No additional management support is needed unless otherwise documented below in the visit note. 

## 2015-03-18 NOTE — Progress Notes (Signed)
Subjective:    Patient ID: Lisa Ellis, female    DOB: [birth year], 40 y.o.   MRN: [id number]  HPI  Pt states she overall feels better. She states felt very good past 2-3 weeks when exercising 5 days a week. But then past week has not gone in at all due to work. When exercising she felt less stress.  Pt states neck pain in trapezius area got better 95%. She notes if she works all day will feel some tightness at end of the day. Pt used muscle relaxant on days tightness more severe. But only used 2 times. It did help a lot but made her sleepy.  Pt reported insomnia on last visit. I rx'd some trazodone. It will take her about one hour to fall back to sleep at times. Pt does not snore. Overall quality of sleep but but still wakes up each night.  Pt expressed desire to loose weight at end of interview.   Review of Systems  Constitutional: Negative for fever, chills and fatigue.  Respiratory: Negative for cough, shortness of breath and wheezing.   Cardiovascular: Negative for chest pain and palpitations.  Gastrointestinal: Negative for abdominal pain.  Musculoskeletal: Negative for back pain.       See hpi  Neurological: Negative for dizziness, seizures, numbness and headaches.  Hematological: Negative for adenopathy. Does not bruise/bleed easily.  Psychiatric/Behavioral: Positive for sleep disturbance. Negative for suicidal ideas, behavioral problems and confusion.    Past Medical History  Diagnosis Date  . Allergy   . Asthma     Only wheezed on time associated with pneumonia. none since.  . Depression     only had brief episode in 2006 for couple of months when dad passed away.    Social History   Social History  . Marital Status: Unknown    Spouse Name: N/A  . Number of Children: N/A  . Years of Education: N/A   Occupational History  . Not on file.   Social History Main Topics  . Smoking status: Never Smoker   . Smokeless tobacco: Never Used  . Alcohol Use: No  . Drug  Use: No  . Sexual Activity: Yes   Other Topics Concern  . Not on file   Social History Narrative    Past Surgical History  Procedure Laterality Date  . Cholecystectomy    . Nasal sinus surgery    . Breast surgery      augmentation procedure.    Family History  Problem Relation Age of Onset  . Hypertension Mother   . Diabetes Father   . Hypertension Maternal Grandfather   . Diabetes Paternal Grandmother     No Known Allergies  Current Outpatient Prescriptions on File Prior to Visit  Medication Sig Dispense Refill  . diclofenac (VOLTAREN) 75 MG EC tablet Take 1 tablet (75 mg total) by mouth 2 (two) times daily. 14 tablet 0  . tiZANidine (ZANAFLEX) 4 MG tablet Take 1 tablet (4 mg total) by mouth at bedtime. 10 tablet 0  . traZODone (DESYREL) 50 MG tablet Take 0.5-1 tablets (25-50 mg total) by mouth at bedtime as needed for sleep. 30 tablet 3   No current facility-administered medications on file prior to visit.    BP 118/78 mmHg  Pulse 81  Temp(Src) 98 F (36.7 C) (Oral)  Ht 5' 7.5" (1.715 m)  Wt 222 lb (100.699 kg)  BMI 34.24 kg/m2  SpO2 98%  LMP 02/16/2015       Objective:  Physical Exam  General Mental Status- Alert. General Appearance- Not in acute distress.   Skin General: Color- Normal Color. Moisture- Normal Moisture.  Neck Carotid Arteries- Normal color. Moisture- Normal Moisture. No carotid bruits. No JVD.  Chest and Lung Exam Auscultation: Breath Sounds:-Normal. CTA.  Cardiovascular Auscultation:Rythm- Regular. Murmurs & Other Heart Sounds:Auscultation of the heart reveals- No Murmurs.  Abdomen Inspection:-Inspeection Normal. Palpation/Percussion:Note:No mass. Palpation and Percussion of the abdomen reveal- Non Tender, Non Distended + BS, no rebound or guarding.   Neurologic Cranial Nerve exam:- CN III-XII intact(No nystagmus), symmetric smile. Strength:- 5/5 equal and symmetric strength both upper and lower extremities.        Assessment & Plan:  Your neck pain appears to be stress related(improved now). Continue exercise for stress relief and muscle relaxant if needed periodically.   For insomnia continue trazadone. Can use ambien periodically as well if you have consecutive night run of poor sleep.    For obesity recommend calorie restriction/reduced 1600 cal or weight watcher along with exercise program.   Follow up in one month by my chart regarding weight loss and insomnia. Then decide on date  actual office visit

## 2015-03-24 ENCOUNTER — Ambulatory Visit (INDEPENDENT_AMBULATORY_CARE_PROVIDER_SITE_OTHER): Payer: 59 | Admitting: Psychology

## 2015-03-24 DIAGNOSIS — F4323 Adjustment disorder with mixed anxiety and depressed mood: Secondary | ICD-10-CM

## 2015-04-28 ENCOUNTER — Ambulatory Visit (INDEPENDENT_AMBULATORY_CARE_PROVIDER_SITE_OTHER): Payer: 59 | Admitting: Psychology

## 2015-04-28 DIAGNOSIS — F4323 Adjustment disorder with mixed anxiety and depressed mood: Secondary | ICD-10-CM | POA: Diagnosis not present

## 2015-05-19 ENCOUNTER — Ambulatory Visit (INDEPENDENT_AMBULATORY_CARE_PROVIDER_SITE_OTHER): Payer: 59 | Admitting: Psychology

## 2015-05-19 DIAGNOSIS — F4323 Adjustment disorder with mixed anxiety and depressed mood: Secondary | ICD-10-CM

## 2015-06-07 ENCOUNTER — Ambulatory Visit (INDEPENDENT_AMBULATORY_CARE_PROVIDER_SITE_OTHER): Payer: 59 | Admitting: Medical

## 2015-06-07 ENCOUNTER — Telehealth: Payer: Self-pay | Admitting: Medical

## 2015-06-07 ENCOUNTER — Encounter: Payer: Self-pay | Admitting: Medical

## 2015-06-07 VITALS — BP 114/72 | HR 68 | Temp 97.8°F | Ht 67.5 in | Wt 221.6 lb

## 2015-06-07 DIAGNOSIS — T485X1A Poisoning by other anti-common-cold drugs, accidental (unintentional), initial encounter: Secondary | ICD-10-CM

## 2015-06-07 DIAGNOSIS — J320 Chronic maxillary sinusitis: Secondary | ICD-10-CM | POA: Diagnosis not present

## 2015-06-07 DIAGNOSIS — T485X5A Adverse effect of other anti-common-cold drugs, initial encounter: Secondary | ICD-10-CM

## 2015-06-07 DIAGNOSIS — J310 Chronic rhinitis: Secondary | ICD-10-CM

## 2015-06-07 DIAGNOSIS — J309 Allergic rhinitis, unspecified: Secondary | ICD-10-CM | POA: Diagnosis not present

## 2015-06-07 MED ORDER — AMOXICILLIN-POT CLAVULANATE 875-125 MG PO TABS: 1.0000 | ORAL_TABLET | Freq: Two times a day (BID) | ORAL | Status: DC

## 2015-06-07 MED ORDER — METHYLPREDNISOLONE ACETATE 40 MG/ML IJ SUSP
40.0000 mg | Freq: Once | INTRAMUSCULAR | Status: AC
  Administered 2015-06-07: 40 mg via INTRAMUSCULAR

## 2015-06-07 MED ORDER — FLUTICASONE PROPIONATE 50 MCG/ACT NA SUSP: 2.0000 | Freq: Every day | NASAL | Status: DC

## 2015-06-07 MED ORDER — LEVOCETIRIZINE DIHYDROCHLORIDE 5 MG PO TABS: 5.0000 mg | ORAL_TABLET | Freq: Every evening | ORAL | Status: DC

## 2015-06-07 NOTE — Patient Instructions (Signed)
For allergies we gave depomedrol 40 mg im. Restart flonase. In 7-10 days start xyzal.  For sinus infection rx augmentin.  For rebound nasal congestion avoid any afrin like products.  Follow up in 7-10 days or as needed

## 2015-06-07 NOTE — Progress Notes (Signed)
Pre visit review using our clinic review tool, if applicable. No additional management support is needed unless otherwise documented below in the visit note. 

## 2015-06-07 NOTE — Progress Notes (Signed)
Subjective:    Patient ID: Lisa Ellis, female    DOB: 1975-12-20, 40 y.o.   MRN: [id number]  HPI  Pt in with some allergy symptoms. Last 2-3 weeks nasal congestion, sneezing and itching eyes. Pt has also some frontal and maxillary sinus pressure.  No fever, no chills or sweats.  Pt has history of nasal fracture in the past. Pt thinks she may have deviated septum.   LMP- May 28, 2015.  Pt has used various nasal spray recently. Both steroid sprays otc and then last week afrin every day. These did not help. In fact now feels worse.  Review of Systems  Constitutional: Negative for fever, chills and fatigue.  HENT: Positive for congestion, postnasal drip, sinus pressure and sneezing. Negative for ear pain, facial swelling and sore throat.   Respiratory: Negative for cough, chest tightness, shortness of breath and wheezing.   Cardiovascular: Negative for chest pain and palpitations.  Gastrointestinal: Negative for abdominal pain.  Musculoskeletal: Negative for myalgias and back pain.  Neurological: Negative for dizziness.  Psychiatric/Behavioral: Negative for behavioral problems and confusion.   Past Medical History  Diagnosis Date  . Allergy   . Asthma     Only wheezed on time associated with pneumonia. none since.  . Depression     only had brief episode in 2006 for couple of months when dad passed away.    Social History   Social History  . Marital Status: Unknown    Spouse Name: N/A  . Number of Children: N/A  . Years of Education: N/A   Occupational History  . Not on file.   Social History Main Topics  . Smoking status: Never Smoker   . Smokeless tobacco: Never Used  . Alcohol Use: No  . Drug Use: No  . Sexual Activity: Yes   Other Topics Concern  . Not on file   Social History Narrative    Past Surgical History  Procedure Laterality Date  . Cholecystectomy    . Nasal sinus surgery    . Breast surgery      augmentation procedure.    Family  History  Problem Relation Age of Onset  . Hypertension Mother   . Diabetes Father   . Hypertension Maternal Grandfather   . Diabetes Paternal Grandmother     No Known Allergies  Current Outpatient Prescriptions on File Prior to Visit  Medication Sig Dispense Refill  . diclofenac (VOLTAREN) 75 MG EC tablet Take 1 tablet (75 mg total) by mouth 2 (two) times daily. 14 tablet 0  . tiZANidine (ZANAFLEX) 4 MG tablet Take 1 tablet (4 mg total) by mouth at bedtime. 10 tablet 0  . traZODone (DESYREL) 50 MG tablet Take 0.5-1 tablets (25-50 mg total) by mouth at bedtime as needed for sleep. 30 tablet 3  . zolpidem (AMBIEN) 5 MG tablet Take 1 tablet (5 mg total) by mouth at bedtime as needed for sleep. 10 tablet 0   No current facility-administered medications on file prior to visit.    BP 114/72 mmHg  Pulse 68  Temp(Src) 97.8 F (36.6 C) (Oral)  Ht 5' 7.5" (1.715 m)  Wt 221 lb 9.6 oz (100.517 kg)  BMI 34.18 kg/m2  SpO2 98%  LMP 05/28/2015       Objective:   Physical Exam  General  Mental Status - Alert. General Appearance - Well groomed. Not in acute distress.  Skin Rashes- No Rashes.  HEENT Head- Normal. Ear Auditory Canal - Left- Normal. Right - Normal.Tympanic  Membrane- Left- Normal. Right- Normal. Eye Sclera/Conjunctiva- Left- Normal. Right- Normal. Nose & Sinuses Nasal Mucosa- Left-  Boggy and Congested. Right-  Boggy and  Congested.Bilateral maxillary and frontal sinus pressure. Mouth & Throat Lips: Upper Lip- Normal: no dryness, cracking, pallor, cyanosis, or vesicular eruption. Lower Lip-Normal: no dryness, cracking, pallor, cyanosis or vesicular eruption. Buccal Mucosa- Bilateral- No Aphthous ulcers. Oropharynx- No Discharge or Erythema. +pnd Tonsils: Characteristics- Bilateral- No Erythema or Congestion. Size/Enlargement- Bilateral- No enlargement. Discharge- bilateral-None.  Neck Neck- Supple. No Masses.   Chest and Lung Exam Auscultation: Breath  Sounds:-Clear even and unlabored.  Cardiovascular Auscultation:Rythm- Regular, rate and rhythm. Murmurs & Other Heart Sounds:Ausculatation of the heart reveal- No Murmurs.  Lymphatic Head & Neck General Head & Neck Lymphatics: Bilateral: Description- No Localized lymphadenopathy.       Assessment & Plan:  For allergies we gave depomedrol 40 mg im. Restart flonase. In 7-10 days start xyzal.  For sinus infection rx augmentin.  For rebound nasal congestion avoid any afrin like products.  Follow up in 7-10 days or as needed

## 2015-06-09 ENCOUNTER — Ambulatory Visit (INDEPENDENT_AMBULATORY_CARE_PROVIDER_SITE_OTHER): Payer: 59 | Admitting: Psychology

## 2015-06-09 DIAGNOSIS — F4323 Adjustment disorder with mixed anxiety and depressed mood: Secondary | ICD-10-CM | POA: Diagnosis not present

## 2015-06-09 NOTE — Telephone Encounter (Signed)
Opened to review 

## 2015-06-11 ENCOUNTER — Other Ambulatory Visit: Payer: Self-pay | Admitting: Medical

## 2015-07-07 ENCOUNTER — Ambulatory Visit: Payer: 59 | Admitting: Psychology

## 2015-09-08 ENCOUNTER — Other Ambulatory Visit: Payer: Self-pay | Admitting: Medical

## 2015-09-22 ENCOUNTER — Encounter: Payer: Self-pay | Admitting: Medical

## 2015-09-22 ENCOUNTER — Ambulatory Visit (INDEPENDENT_AMBULATORY_CARE_PROVIDER_SITE_OTHER): Payer: 59 | Admitting: Medical

## 2015-09-22 VITALS — BP 116/78 | HR 68 | Temp 98.1°F | Ht 67.5 in | Wt 216.4 lb

## 2015-09-22 DIAGNOSIS — L723 Sebaceous cyst: Secondary | ICD-10-CM

## 2015-09-22 DIAGNOSIS — M25551 Pain in right hip: Secondary | ICD-10-CM | POA: Diagnosis not present

## 2015-09-22 DIAGNOSIS — M79669 Pain in unspecified lower leg: Secondary | ICD-10-CM | POA: Diagnosis not present

## 2015-09-22 MED ORDER — CEPHALEXIN 500 MG PO CAPS: 500.0000 mg | ORAL_CAPSULE | Freq: Two times a day (BID) | ORAL | Status: DC

## 2015-09-22 NOTE — Progress Notes (Signed)
Pre visit review using our clinic review tool, if applicable. No additional management support is needed unless otherwise documented below in the visit note. 

## 2015-09-22 NOTE — Progress Notes (Signed)
Subjective:    Patient ID: Lisa Ellis, female    DOB: 03-01-1976, 40 y.o.   MRN: [id number]  HPI   Pt in with report of small area mid t-spine. Area on skin that has been same size for at least 2 years but now little large and little tender just recently over last 2 days. No fever, no chills or sweats. Pt was considering going to derm but she needs to have referral.  Also pt reports shins have been hurting for one month. Pain is faint and low level. Pt not taking anything for it except tylenol pm. Then rt hip started bothering her. But hip only for one day. Pt taken tylenol pm fi needed.  LMP- September 06, 2015.   Review of Systems  Constitutional: Negative for fever, chills and fatigue.  Respiratory: Negative for chest tightness.   Cardiovascular: Negative for palpitations.  Gastrointestinal: Negative for abdominal pain.  Genitourinary: Negative for dysuria.  Musculoskeletal:       Shins hurt. Recently running 3 miles a day for past 2-3 months.   Some rt hip pain for one day.   Neurological: Negative for dizziness and facial asymmetry.  Hematological: Negative for adenopathy. Does not bruise/bleed easily.  Psychiatric/Behavioral: Negative for behavioral problems and confusion.    Past Medical History  Diagnosis Date  . Allergy   . Asthma     Only wheezed on time associated with pneumonia. none since.  . Depression     only had brief episode in 2006 for couple of months when dad passed away.     Social History   Social History  . Marital Status: Unknown    Spouse Name: N/A  . Number of Children: N/A  . Years of Education: N/A   Occupational History  . Not on file.   Social History Main Topics  . Smoking status: Never Smoker   . Smokeless tobacco: Never Used  . Alcohol Use: No  . Drug Use: No  . Sexual Activity: Yes   Other Topics Concern  . Not on file   Social History Narrative    Past Surgical History  Procedure Laterality Date  . Cholecystectomy    .  Nasal sinus surgery    . Breast surgery      augmentation procedure.    Family History  Problem Relation Age of Onset  . Hypertension Mother   . Diabetes Father   . Hypertension Maternal Grandfather   . Diabetes Paternal Grandmother     No Known Allergies  No current outpatient prescriptions on file prior to visit.   No current facility-administered medications on file prior to visit.    BP 116/78 mmHg  Pulse 68  Temp(Src) 98.1 F (36.7 C) (Oral)  Ht 5' 7.5" (1.715 m)  Wt 216 lb 6.4 oz (98.158 kg)  BMI 33.37 kg/m2  SpO2 98%  LMP 09/06/2015      Objective:   Physical Exam  General- No acute distress. Pleasant patient. Neck- Full range of motion, no jvd Lungs- Clear, even and unlabored. Heart- regular rate and rhythm. Neurologic- CNII- XII grossly intact.  Derm- mid t-spine. Small raised areas. About 9 mm diameter. Faint red center. Faint pain on palpation.  Rt hi- pain on palpation lateral aspect. Pain on rom  Lower ext- both mid tibia area faint pain on palpation. But none on walking. No brusiing.           Assessment & Plan:  You may have infected sebaceous cyst. Will rx  keflex and refer to dermatologist.   For shin and hip pain. I think these are repetitive use injuries. Advise no cardio for 2 days. Then can resume but only elliptical. No running for 7 days. If areas still hurt then get xrays.   If shin area pain persists then refer to sports med  If shin area pain persists then refer to sports med  Can use tylenol or ibuprofen for pain.  Nilesh Stegall, Percell Miller, PA-C

## 2015-09-22 NOTE — Patient Instructions (Addendum)
You may have infected sebaceous cyst. Will rx keflex and refer to dermatologist.   For shin and hip pain. I think these are repetitive use injuries. Advise no cardio for 2 days. Then can resume but only elliptical. No running for 7 days. If areas still hurt then get xrays.   If shin area pain persists then refer to sports med  Can use tylenol or ibuprofen for pain.  Follow up in 10-14 days or as needed

## 2015-10-12 ENCOUNTER — Ambulatory Visit (INDEPENDENT_AMBULATORY_CARE_PROVIDER_SITE_OTHER): Payer: 59 | Admitting: Physician Assistant

## 2015-10-12 ENCOUNTER — Encounter: Payer: Self-pay | Admitting: Physician Assistant

## 2015-10-12 VITALS — BP 108/76 | HR 87 | Temp 98.1°F | Resp 16 | Ht 67.5 in | Wt 217.2 lb

## 2015-10-12 DIAGNOSIS — G44209 Tension-type headache, unspecified, not intractable: Secondary | ICD-10-CM | POA: Diagnosis not present

## 2015-10-12 DIAGNOSIS — M62838 Other muscle spasm: Secondary | ICD-10-CM

## 2015-10-12 DIAGNOSIS — M6248 Contracture of muscle, other site: Secondary | ICD-10-CM | POA: Diagnosis not present

## 2015-10-12 MED ORDER — MELOXICAM 15 MG PO TABS: 15.0000 mg | ORAL_TABLET | Freq: Every day | ORAL | 0 refills | Status: DC

## 2015-10-12 MED ORDER — TIZANIDINE HCL 4 MG PO CAPS: 4.0000 mg | ORAL_CAPSULE | Freq: Three times a day (TID) | ORAL | 0 refills | Status: DC

## 2015-10-12 NOTE — Patient Instructions (Signed)
Please take the Meloxicam once daily with food. Use tylenol for breakthrough pain. Take the Zanaflex in the evening. Can also take during the day as directed but do not drive while on medication. Cut back on any heavy lifting. Heating pad as directed for 10 minutes a few times per day.  Follow-up if not resolving over the next week.

## 2015-10-12 NOTE — Progress Notes (Signed)
Pre visit review using our clinic review tool, if applicable. No additional management support is needed unless otherwise documented below in the visit note/SLS  

## 2015-10-12 NOTE — Progress Notes (Signed)
Patient presents to clinic today c/o 2 weeks of left neck pain, shoulder pain and pectoral pain x 2 weeks.  Endorses muscular tightness extending into neck. Notes mild headache intermittent x 4 days, described as a vice grip around the back of the head. Denies nausea or vomiting. Denies fever, chills. Denies aura, photophobia or phonophobia.  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 Is doing her job but is also taking over all responsibilities in the office. Denies panic attack or depressed mood. Denies chest pain, palpitations, LH or dizziness.  Past Medical History:  Diagnosis Date  . Allergy   . Asthma    Only wheezed on time associated with pneumonia. none since.  . Depression    only had brief episode in 2006 for couple of months when dad passed away.    No current outpatient prescriptions on file prior to visit.   No current facility-administered medications on file prior to visit.     No Known Allergies  Family History  Problem Relation Age of Onset  . Hypertension Mother   . Diabetes Father   . Hypertension Maternal Grandfather   . Diabetes Paternal Grandmother     Social History   Social History  . Marital status: Unknown    Spouse name: N/A  . Number of children: N/A  . Years of education: N/A   Social History Main Topics  . Smoking status: Never Smoker  . Smokeless tobacco: Never Used  . Alcohol use No  . Drug use: No  . Sexual activity: Yes   Other Topics Concern  . None   Social History Narrative  . None    Review of Systems - See HPI.  All other ROS are negative.  BP 108/76 (BP Location: Right Arm, Patient Position: Sitting, Cuff Size: Large)   Pulse 87   Temp 98.1 F (36.7 C) (Oral)   Resp 16   Ht 5' 7.5" (1.715 m)   Wt 217 lb 4 oz (98.5 kg)   LMP 10/02/2015   SpO2 98%   BMI 33.52 kg/m   Physical Exam  Constitutional: She is oriented to person, place, and time and  well-developed, well-nourished, and in no distress.  HENT:  Head: Normocephalic and atraumatic.  Eyes: Conjunctivae are normal.  Neck: Muscular tenderness present. No spinous process tenderness present. Normal range of motion present.  Cardiovascular: Normal rate, regular rhythm, normal heart sounds and intact distal pulses.   Pulmonary/Chest: Effort normal and breath sounds normal. No respiratory distress. She has no wheezes. She has no rales. She exhibits no tenderness.    Neurological: She is alert and oriented to person, place, and time.  Skin: Skin is warm and dry. No rash noted.  Psychiatric: Affect normal.  Vitals reviewed.  Assessment/Plan: 1. Tension headache Secondary to muscle spasm and tension.  Will begin Zanaflex and Mobic.Supportive measures reviewed with patient. Follow-up if not improving. - tiZANidine (ZANAFLEX) 4 MG capsule; Take 1 capsule (4 mg total) by mouth 3 (three) times daily.  Dispense: 30 capsule; Refill: 0 - meloxicam (MOBIC) 15 MG tablet; Take 1 tablet (15 mg total) by mouth daily.  Dispense: 20 tablet; Refill: 0  2. Neck muscle spasm Discussed stress as a contributor. Rx Zanaflex and Mobic. Take as directed. Supportive measures and OTC aids discussed. Follow-up if not resolving. - tiZANidine (ZANAFLEX) 4 MG capsule; Take 1 capsule (4 mg total) by mouth 3 (three) times daily.  Dispense: 30 capsule; Refill: 0 - meloxicam (MOBIC) 15 MG tablet; Take 1 tablet (15 mg total) by mouth daily.  Dispense: 20 tablet; Refill: 0   Leeanne Rio, [place]

## 2016-01-12 ENCOUNTER — Encounter: Payer: Self-pay | Admitting: Medical

## 2016-01-12 ENCOUNTER — Ambulatory Visit (INDEPENDENT_AMBULATORY_CARE_PROVIDER_SITE_OTHER): Payer: 59 | Admitting: Medical

## 2016-01-12 VITALS — BP 118/78 | HR 77 | Temp 98.0°F | Ht 67.5 in | Wt 218.0 lb

## 2016-01-12 DIAGNOSIS — Z0000 Encounter for general adult medical examination without abnormal findings: Secondary | ICD-10-CM | POA: Diagnosis not present

## 2016-01-12 DIAGNOSIS — R319 Hematuria, unspecified: Secondary | ICD-10-CM | POA: Diagnosis not present

## 2016-01-12 LAB — CBC WITH DIFFERENTIAL/PLATELET
BASOS ABS: 0 10*3/uL (ref 0.0–0.1)
Basophils Relative: 0.5 % (ref 0.0–3.0)
EOS ABS: 0.1 10*3/uL (ref 0.0–0.7)
Eosinophils Relative: 1.1 % (ref 0.0–5.0)
HCT: 42.6 % (ref 36.0–46.0)
Hemoglobin: 14.6 g/dL (ref 12.0–15.0)
Lymphocytes Relative: 26.3 % (ref 12.0–46.0)
Lymphs Abs: 2.6 10*3/uL (ref 0.7–4.0)
MCHC: 34.3 g/dL (ref 30.0–36.0)
MCV: 91.1 fl (ref 78.0–100.0)
MONOS PCT: 6 % (ref 3.0–12.0)
Monocytes Absolute: 0.6 10*3/uL (ref 0.1–1.0)
NEUTROS ABS: 6.6 10*3/uL (ref 1.4–7.7)
NEUTROS PCT: 66.1 % (ref 43.0–77.0)
PLATELETS: 282 10*3/uL (ref 150.0–400.0)
RBC: 4.67 Mil/uL (ref 3.87–5.11)
RDW: 12.4 % (ref 11.5–15.5)
WBC: 9.9 10*3/uL (ref 4.0–10.5)

## 2016-01-12 LAB — POCT URINALYSIS DIPSTICK
BILIRUBIN UA: NEGATIVE
Glucose, UA: NEGATIVE
Ketones, UA: NEGATIVE
LEUKOCYTES UA: NEGATIVE
NITRITE UA: NEGATIVE
PH UA: 6
PROTEIN UA: NEGATIVE
Spec Grav, UA: >=1.03
Urobilinogen, UA: 0.2

## 2016-01-12 LAB — COMPREHENSIVE METABOLIC PANEL
ALBUMIN: 4.6 g/dL (ref 3.5–5.2)
ALK PHOS: 69 U/L (ref 39–117)
ALT: 20 U/L (ref 0–35)
AST: 18 U/L (ref 0–37)
BILIRUBIN TOTAL: 0.8 mg/dL (ref 0.2–1.2)
BUN: 11 mg/dL (ref 6–23)
CO2: 26 mEq/L (ref 19–32)
CREATININE: 0.76 mg/dL (ref 0.40–1.20)
Calcium: 9.7 mg/dL (ref 8.4–10.5)
Chloride: 103 mEq/L (ref 96–112)
GFR: 88.81 mL/min (ref >60.00)
GLUCOSE: 95 mg/dL (ref 70–99)
POTASSIUM: 3.9 meq/L (ref 3.5–5.1)
SODIUM: 138 meq/L (ref 135–145)
TOTAL PROTEIN: 7.5 g/dL (ref 6.0–8.3)

## 2016-01-12 LAB — LIPID PANEL
CHOLESTEROL: 187 mg/dL (ref 0–200)
HDL: 49.7 mg/dL (ref >39.00)
LDL Cholesterol: 114 mg/dL — ABNORMAL HIGH (ref 0–99)
NONHDL: 137.03
Total CHOL/HDL Ratio: 4
Triglycerides: 117 mg/dL (ref 0.0–149.0)
VLDL: 23.4 mg/dL (ref 0.0–40.0)

## 2016-01-12 LAB — TSH: TSH: 1.03 u[IU]/mL (ref 0.35–4.50)

## 2016-01-12 MED ORDER — MELOXICAM 15 MG PO TABS: 15.0000 mg | ORAL_TABLET | Freq: Every day | ORAL | 0 refills | Status: DC

## 2016-01-12 MED ORDER — TRAMADOL HCL 50 MG PO TABS: 50.0000 mg | ORAL_TABLET | Freq: Three times a day (TID) | ORAL | 0 refills | Status: DC | PRN

## 2016-01-12 NOTE — Progress Notes (Signed)
Pre visit review using our clinic review tool, if applicable. No additional management support is needed unless otherwise documented below in the visit note. 

## 2016-01-12 NOTE — Patient Instructions (Addendum)
For your wellness exam will get cbc, cmp, tsh, lipid pane, and urine dip poct.  Will place your mammogram order. Please call the center if no one contacts you.  For your hx of trapezius pain will refill you meloxicam. Will give very limited tramadol for upper back pain if sleep interrupted.   For occasional epigastric area pain could do h pylori breath test and later date. And maybe chest xray since lower rib region do hurt on palpation.  On discussion decided no ekg today since no current chest pain and negative stress test in past. But if any anterior chest pain occurring again the ED evaluation or be seen here.  Follow up in 3-4 weeks or as needed

## 2016-01-12 NOTE — Progress Notes (Signed)
Subjective:    Patient ID: Lisa Ellis, female    DOB: May 17, 1975, 40 y.o.   MRN: [id number]  HPI   I have reviewed pt PMH, PSH, FH, Social History and Surgical History.  Works [employer], [employer] just recently restarted exercising,   Pt declines flu vaccines.  Pt had pap smear done last week was normal.   Pt states mammogram not ordered. Pt had done with [facility] [place] [address] [place]last year and was normal.  Pt is fasting.   LMp- Dec 30, 2015.    Review of Systems  Constitutional: Negative for chills, fatigue and fever.  HENT: Negative for congestion and ear pain.   Respiratory: Negative for cough, chest tightness, shortness of breath and wheezing.   Cardiovascular: Negative for chest pain and palpitations.  Gastrointestinal: Negative for abdominal pain, blood in stool, diarrhea, nausea and vomiting.       At times epigastric area pain.  Genitourinary: Negative for decreased urine volume, dysuria, flank pain, frequency, genital sores and pelvic pain.  Musculoskeletal: Negative for arthralgias, back pain, joint swelling, myalgias and neck stiffness.       Lt trapezius tendneress. History of mild chronic pain.  3 days transient pain that went to side and chest then went away. No reoccurence.    Also occasional transient epigastric/ lower rib area pain when she palpates. When she presses on area.  Skin: Negative for rash.  Neurological: Negative for dizziness and headaches.  Hematological: Negative for adenopathy. Does not bruise/bleed easily.  Psychiatric/Behavioral: Negative for behavioral problems, confusion and hallucinations. The patient is not hyperactive.     Past Medical History:  Diagnosis Date  . Allergy   . Asthma    Only wheezed on time associated with pneumonia. none since.  . Depression    only had brief episode in 2006 for couple of months when dad passed away.     Social History   Social History  . Marital  status: Unknown    Spouse name: N/A  . Number of children: N/A  . Years of education: N/A   Occupational History  . Not on file.   Social History Main Topics  . Smoking status: Never Smoker  . Smokeless tobacco: Never Used  . Alcohol use No  . Drug use: No  . Sexual activity: Yes   Other Topics Concern  . Not on file   Social History Narrative  . No narrative on file    Past Surgical History:  Procedure Laterality Date  . BREAST SURGERY     augmentation procedure.  . CHOLECYSTECTOMY    . NASAL SINUS SURGERY      Family History  Problem Relation Age of Onset  . Hypertension Mother   . Diabetes Father   . Hypertension Maternal Grandfather   . Diabetes Paternal Grandmother     No Known Allergies  No current outpatient prescriptions on file prior to visit.   No current facility-administered medications on file prior to visit.     BP 118/78 (BP Location: Left Arm, Patient Position: Sitting)   Pulse 77   Temp 98 F (36.7 C) (Oral)   Ht 5' 7.5" (1.715 m)   Wt 218 lb (98.9 kg)   LMP 12/20/2015   SpO2 98%   BMI 33.64 kg/m       Objective:   Physical Exam  General Mental Status- Alert. General Appearance- Not in acute distress.   Skin General: Color- Normal Color. Moisture- Normal Moisture. On  evaluation no worrisome lesions. Including back.  Neck Carotid Arteries- Normal color. Moisture- Normal Moisture. No carotid bruits. No JVD.  Chest and Lung Exam Auscultation: Breath Sounds:-Normal.  Cardiovascular Auscultation:Rythm- Regular. Murmurs & Other Heart Sounds:Auscultation of the heart reveals- No Murmurs.  Abdomen Inspection:-Inspeection Normal. Palpation/Percussion:Note:No mass. Palpation and Percussion of the abdomen reveal- faint epigastic Tender, Non Distended + BS, no rebound or guarding.  Anterior thorax- faint lower rib tenderness near xyphoid process.   Back- left trapezius tenderness to palpation. Medial to  scapula.  Neurologic Cranial Nerve exam:- CN III-XII intact(No nystagmus), symmetric smile. Strength:- 5/5 equal and symmetric strength both upper and lower extremities.      Assessment & Plan:  For your wellness exam will get cbc, cmp, tsh, lipid pane, and urine dip poct.  Will place your mammogram order. Please call the center if no one contacts you.  For your hx of trapezius pain will refill you meloxicam. Will give very limited tramadol for upper back pain if sleep interrupted.   For occasional epigastric area pain could do h pylori breath test and later date. And maybe chest xray since lower rib region do hurt on palpation.  On discussion decided no ekg today since no current chest pain and negative stress test in past. But if any anterior chest pain occurring again the ED evaluation or be seen here.  Follow up in 3-4 weeks or as needed  Pt declined ekg today and presently after discussion.

## 2016-01-13 ENCOUNTER — Encounter: Payer: Self-pay | Admitting: Medical

## 2016-01-14 LAB — URINE CULTURE

## 2016-01-17 NOTE — Progress Notes (Signed)
Pt has seen results on MyChart and message also sent for patient to call back if any questions.

## 2016-01-18 ENCOUNTER — Ambulatory Visit (INDEPENDENT_AMBULATORY_CARE_PROVIDER_SITE_OTHER): Payer: 59 | Admitting: Physician Assistant

## 2016-01-18 ENCOUNTER — Encounter: Payer: Self-pay | Admitting: Physician Assistant

## 2016-01-18 VITALS — BP 106/78 | HR 74 | Temp 98.2°F | Resp 16 | Ht 68.0 in | Wt 222.5 lb

## 2016-01-18 DIAGNOSIS — M62838 Other muscle spasm: Secondary | ICD-10-CM | POA: Diagnosis not present

## 2016-01-18 DIAGNOSIS — M62830 Muscle spasm of back: Secondary | ICD-10-CM

## 2016-01-18 MED ORDER — BACLOFEN 10 MG PO TABS: 10.0000 mg | ORAL_TABLET | Freq: Three times a day (TID) | ORAL | 0 refills | Status: DC

## 2016-01-18 NOTE — Progress Notes (Signed)
Pre visit review using our clinic review tool, if applicable. No additional management support is needed unless otherwise documented below in the visit note/SLS  

## 2016-01-18 NOTE — Patient Instructions (Signed)
Please continue Meloxicam with food. Start Baclofen each evening. Can take up to three times daily with food if you will not be driving or operating heavy machinery. Please continue heating pad -- only in 10 minute intervals.  You will be contacted for assessment with Dr. Barbaraann Barthel. Follow-up with Percell Miller in 1 week.

## 2016-01-18 NOTE — Progress Notes (Signed)
Patient presents to clinic today c/o several month history of intermittent upper back pain that has become more frequent. Sharper in upper back, pulling sensation in shoulders and chest. Pain is worse in the evenings after physical activity during the day. Denies chest pain, palpitations, SOB, lightheadedness or dizziness. Has had prior workup for similar symptoms in 2016 including a Holter study (negative) and Echo stress test (EMR reviewed and negative stress test). Patient denies heart burn, indigestion, nausea or vomiting. Was seen by PCP 6 days ago and felt to be MSK in nature. Was started on Meloxicam which she endorses taking as directed. Only notes mild improvement in symptoms. States she was evaluated and treated by physical therapy last year for her similar symptoms with resolution of pain.  Past Medical History:  Diagnosis Date  . Allergy   . Asthma    Only wheezed on time associated with pneumonia. none since.  . Depression    only had brief episode in 2006 for couple of months when dad passed away.    Current Outpatient Prescriptions on File Prior to Visit  Medication Sig Dispense Refill  . meloxicam (MOBIC) 15 MG tablet Take 1 tablet (15 mg total) by mouth daily. 30 tablet 0  . traMADol (ULTRAM) 50 MG tablet Take 1 tablet (50 mg total) by mouth every 8 (eight) hours as needed. 6 tablet 0   No current facility-administered medications on file prior to visit.     No Known Allergies  Family History  Problem Relation Age of Onset  . Hypertension Mother   . Diabetes Father   . Hypertension Maternal Grandfather   . Diabetes Paternal Grandmother     Social History   Social History  . Marital status: Unknown    Spouse name: N/A  . Number of children: N/A  . Years of education: N/A   Social History Main Topics  . Smoking status: Never Smoker  . Smokeless tobacco: Never Used  . Alcohol use No  . Drug use: No  . Sexual activity: Yes   Other Topics Concern  . Not  on file   Social History Narrative  . No narrative on file    Review of Systems - See HPI.  All other ROS are negative.  LMP 12/20/2015   Physical Exam  Constitutional: She is oriented to person, place, and time and well-developed, well-nourished, and in no distress.  HENT:  Head: Normocephalic and atraumatic.  Eyes: Conjunctivae are normal.  Neck: Neck supple.  Cardiovascular: Normal rate, regular rhythm, normal heart sounds and intact distal pulses.   Pulmonary/Chest: Effort normal and breath sounds normal. No respiratory distress. She has no wheezes. She has no rales. She exhibits no tenderness.  Musculoskeletal:       Right shoulder: Normal.       Left shoulder: Normal.       Cervical back: She exhibits tenderness and spasm. She exhibits no bony tenderness.       Thoracic back: She exhibits tenderness. She exhibits no bony tenderness.  ROM of shoulders and neck within normal limits but patient notes sensation of pulling from lower back muscles and pectorals with ROM.  Neurological: She is alert and oriented to person, place, and time.  Skin: Skin is warm and dry.  Vitals reviewed.   Recent Results (from the past 2160 hour(s))  Comp Met (CMET)     Status: None   Collection Time: 01/12/16 10:36 AM  Result Value Ref Range   Sodium 138 135 -  145 mEq/L   Potassium 3.9 3.5 - 5.1 mEq/L   Chloride 103 96 - 112 mEq/L   CO2 26 19 - 32 mEq/L   Glucose, Bld 95 70 - 99 mg/dL   BUN 11 6 - 23 mg/dL   Creatinine, Ser 0.76 0.40 - 1.20 mg/dL   Total Bilirubin 0.8 0.2 - 1.2 mg/dL   Alkaline Phosphatase 69 39 - 117 U/L   AST 18 0 - 37 U/L   ALT 20 0 - 35 U/L   Total Protein 7.5 6.0 - 8.3 g/dL   Albumin 4.6 3.5 - 5.2 g/dL   Calcium 9.7 8.4 - 10.5 mg/dL   GFR 88.81 >60.00 mL/min  Lipid panel     Status: Abnormal   Collection Time: 01/12/16 10:36 AM  Result Value Ref Range   Cholesterol 187 0 - 200 mg/dL    Comment: ATP III Classification       Desirable:  < 200 mg/dL                Borderline High:  200 - 239 mg/dL          High:  > = 240 mg/dL   Triglycerides 117.0 0.0 - 149.0 mg/dL    Comment: Normal:  <150 mg/dLBorderline High:  150 - 199 mg/dL   HDL 49.70 >39.00 mg/dL   VLDL 23.4 0.0 - 40.0 mg/dL   LDL Cholesterol 114 (H) 0 - 99 mg/dL   Total CHOL/HDL Ratio 4     Comment:                Men          Women1/2 Average Risk     3.4          3.3Average Risk          5.0          4.42X Average Risk          9.6          7.13X Average Risk          15.0          11.0                       NonHDL 137.03     Comment: NOTE:  Non-HDL goal should be 30 mg/dL higher than patient's LDL goal (i.e. LDL goal of < 70 mg/dL, would have non-HDL goal of < 100 mg/dL)  TSH     Status: None   Collection Time: 01/12/16 10:36 AM  Result Value Ref Range   TSH 1.03 0.35 - 4.50 uIU/mL  CBC with Differential/Platelet     Status: None   Collection Time: 01/12/16 10:37 AM  Result Value Ref Range   WBC 9.9 4.0 - 10.5 K/uL   RBC 4.67 3.87 - 5.11 Mil/uL   Hemoglobin 14.6 12.0 - 15.0 g/dL   HCT 42.6 36.0 - 46.0 %   MCV 91.1 78.0 - 100.0 fl   MCHC 34.3 30.0 - 36.0 g/dL   RDW 12.4 11.5 - 15.5 %   Platelets 282.0 150.0 - 400.0 K/uL   Neutrophils Relative % 66.1 43.0 - 77.0 %   Lymphocytes Relative 26.3 12.0 - 46.0 %   Monocytes Relative 6.0 3.0 - 12.0 %   Eosinophils Relative 1.1 0.0 - 5.0 %   Basophils Relative 0.5 0.0 - 3.0 %   Neutro Abs 6.6 1.4 - 7.7 K/uL   Lymphs Abs 2.6 0.7 - 4.0  K/uL   Monocytes Absolute 0.6 0.1 - 1.0 K/uL   Eosinophils Absolute 0.1 0.0 - 0.7 K/uL   Basophils Absolute 0.0 0.0 - 0.1 K/uL  POCT urinalysis dipstick     Status: None   Collection Time: 01/12/16 11:57 AM  Result Value Ref Range   Color, UA Yellow    Clarity, UA Clear    Glucose, UA neg    Bilirubin, UA neg    Ketones, UA neg    Spec Grav, UA >=1.030    Blood, UA trace    pH, UA 6.0    Protein, UA neg    Urobilinogen, UA 0.2    Nitrite, UA neg    Leukocytes, UA Negative Negative  Urine Culture      Status: None   Collection Time: 01/12/16 11:59 AM  Result Value Ref Range   Organism ID, Bacteria      Multiple organisms present,each less than 10,000 CFU/mL. These organisms,commonly found on external and internal genitalia,are considered colonizers. No further testing performed.     Assessment/Plan: 1. Neck muscle spasm Continue Mobic. Start Baclofen as directed. Supportive measures reviewed. Referral to Sports Medicine for further evaluation giving ongoing symptoms. Low cardiac risk, again with negative prior workup including Holter and Echo Stress. Alarms signs/symptoms reviewed with patient that would prompt ER assessment. - baclofen (LIORESAL) 10 MG tablet; Take 1 tablet (10 mg total) by mouth 3 (three) times daily.  Dispense: 30 each; Refill: 0 - AMB referral to sports medicine    Leeanne Rio, PA-C

## 2016-01-19 ENCOUNTER — Ambulatory Visit (INDEPENDENT_AMBULATORY_CARE_PROVIDER_SITE_OTHER): Payer: 59 | Admitting: Family Medicine

## 2016-01-19 ENCOUNTER — Encounter: Payer: Self-pay | Admitting: Family Medicine

## 2016-01-19 DIAGNOSIS — M549 Dorsalgia, unspecified: Secondary | ICD-10-CM | POA: Diagnosis not present

## 2016-01-19 MED ORDER — PREDNISONE 10 MG PO TABS: ORAL_TABLET | ORAL | 0 refills | Status: DC

## 2016-01-19 NOTE — Patient Instructions (Signed)
You have strain/spasms of your left trapezius and rhomboid muscles. Take the prednisone as directed. Don't take the meloxicam again until after you've finished the prednisone. Baclofen as needed for spasms. Ok to take tylenol, tramadol in addition to this. Start physical therapy - do home exercises on days you don't go to therapy. Consider getting a massage or massages as well. Follow up with me in 5-6 weeks.

## 2016-01-24 NOTE — Assessment & Plan Note (Signed)
2/2 strain/spasms of trapezius and rhomboid muscles.  She would like to start with physical therapy and prednisone dose pack.  Continue baclofen as needed for spasms. Tylenol, tramadol as needed.  Massage may be helpful as well.  F/u in 5-6 weeks.

## 2016-01-24 NOTE — Progress Notes (Signed)
PCP and consultation requested by: [name], PA-C  Subjective:   HPI: Patient is a 40 y.o. female here for left upper back pain.  Patient reports for about 6 months she has had worsening problems left upper back. She had problems here previously that responded to 6 weeks of physical therapy and resolved but has since recurred in slightly different area. Tried mobic, baclofen, heat/ice, chiropractic care, massage. No bowel/bladder dysfunction. No numbness or tingling. Pain up to 7/10 level, sharp.  Past Medical History:  Diagnosis Date  . Allergy   . Asthma    Only wheezed on time associated with pneumonia. none since.  . Depression    only had brief episode in 2006 for couple of months when dad passed away.    Current Outpatient Prescriptions on File Prior to Visit  Medication Sig Dispense Refill  . baclofen (LIORESAL) 10 MG tablet Take 1 tablet (10 mg total) by mouth 3 (three) times daily. 30 each 0  . Biotin 1 MG CAPS Take by mouth.    . Calcium Polycarbophil (FIBER-CAPS PO) Take by mouth daily.    . meloxicam (MOBIC) 15 MG tablet Take 1 tablet (15 mg total) by mouth daily. 30 tablet 0  . traMADol (ULTRAM) 50 MG tablet Take 1 tablet (50 mg total) by mouth every 8 (eight) hours as needed. 6 tablet 0   No current facility-administered medications on file prior to visit.     Past Surgical History:  Procedure Laterality Date  . BREAST SURGERY     augmentation procedure.  . CHOLECYSTECTOMY    . NASAL SINUS SURGERY      No Known Allergies  Social History   Social History  . Marital status: Unknown    Spouse name: N/A  . Number of children: N/A  . Years of education: N/A   Occupational History  . Not on file.   Social History Main Topics  . Smoking status: Never Smoker  . Smokeless tobacco: Never Used  . Alcohol use No  . Drug use: No  . Sexual activity: Yes   Other Topics Concern  . Not on file   Social History Narrative  . No narrative on file     Family History  Problem Relation Age of Onset  . Hypertension Mother   . Diabetes Father   . Hypertension Maternal Grandfather   . Diabetes Paternal Grandmother     BP (!) 142/84   Pulse 83   Ht 5\' 8"  (1.727 m)   Wt 220 lb (99.8 kg)   LMP 01/14/2016   BMI 33.45 kg/m   Review of Systems: See HPI above.     Objective:  Physical Exam:  Gen: NAD, comfortable in exam room  Neck/upper back: No gross deformity, swelling, bruising. TTP medial to left scapula and within left trapezius.  No midline/bony TTP. FROM neck - pain on all motions in same area above. BUE strength 5/5.   Sensation intact to light touch.   2+ equal reflexes in triceps, biceps, brachioradialis tendons. Negative spurlings. NV intact distal BUEs.   Assessment & Plan:  1. Left upper back pain - 2/2 strain/spasms of trapezius and rhomboid muscles.  She would like to start with physical therapy and prednisone dose pack.  Continue baclofen as needed for spasms. Tylenol, tramadol as needed.  Massage may be helpful as well.  F/u in 5-6 weeks.

## 2016-02-06 ENCOUNTER — Encounter: Payer: Self-pay | Admitting: Medical

## 2016-02-09 ENCOUNTER — Encounter: Payer: Self-pay | Admitting: Medical

## 2016-02-14 ENCOUNTER — Ambulatory Visit: Payer: Self-pay | Admitting: Medical

## 2016-02-14 NOTE — Addendum Note (Signed)
Addended by: Sherrie George F on: 02/14/2016 08:34 AM   Modules accepted: Orders

## 2016-02-14 NOTE — Telephone Encounter (Signed)
Will you call patient and see if she can schedule with me tomorrow. She need 8-8:30(chest pain concerns on her my chart message and other concerns). She showed up today late. I was in a room and she was told to reschedule. I want her to have early morning appointment some time this week so she does not have to wait on me if I run behind.

## 2016-02-14 NOTE — Telephone Encounter (Signed)
Noted. Appt scheduled for tomorrow (02/15/16) at 8 am with [name], PA-C.

## 2016-02-15 ENCOUNTER — Encounter: Payer: Self-pay | Admitting: Medical

## 2016-02-15 ENCOUNTER — Ambulatory Visit (HOSPITAL_BASED_OUTPATIENT_CLINIC_OR_DEPARTMENT_OTHER)
Admission: RE | Admit: 2016-02-15 | Discharge: 2016-02-15 | Disposition: A | Discharge status: Home / Self Care | Payer: 59 | Source: Ambulatory Visit | Attending: Medical | Admitting: Medical

## 2016-02-15 ENCOUNTER — Ambulatory Visit: Payer: 59 | Admitting: Physical Therapy

## 2016-02-15 ENCOUNTER — Ambulatory Visit (INDEPENDENT_AMBULATORY_CARE_PROVIDER_SITE_OTHER): Payer: 59 | Admitting: Medical

## 2016-02-15 VITALS — BP 104/70 | HR 78 | Temp 98.0°F | Ht 68.0 in | Wt 224.8 lb

## 2016-02-15 DIAGNOSIS — Z1231 Encounter for screening mammogram for malignant neoplasm of breast: Secondary | ICD-10-CM

## 2016-02-15 DIAGNOSIS — M549 Dorsalgia, unspecified: Secondary | ICD-10-CM | POA: Diagnosis present

## 2016-02-15 DIAGNOSIS — R0789 Other chest pain: Secondary | ICD-10-CM

## 2016-02-15 DIAGNOSIS — Z0000 Encounter for general adult medical examination without abnormal findings: Secondary | ICD-10-CM | POA: Insufficient documentation

## 2016-02-15 DIAGNOSIS — R079 Chest pain, unspecified: Secondary | ICD-10-CM | POA: Diagnosis not present

## 2016-02-15 DIAGNOSIS — Z1239 Encounter for other screening for malignant neoplasm of breast: Secondary | ICD-10-CM

## 2016-02-15 MED ORDER — CELECOXIB 200 MG PO CAPS: 200.0000 mg | ORAL_CAPSULE | Freq: Every day | ORAL | 0 refills | Status: DC

## 2016-02-15 MED ORDER — KETOROLAC TROMETHAMINE 60 MG/2ML IM SOLN
60.0000 mg | Freq: Once | INTRAMUSCULAR | Status: AC
  Administered 2016-02-15: 60 mg via INTRAMUSCULAR

## 2016-02-15 NOTE — Patient Instructions (Addendum)
For your trapezius region pain and upper back region pain will give toradol 60 mg im.  Will also rx celebrex for pain and inflammation. Start tomorrow.  Continue current plan by Sports med except prednsione taper dose is finished.  Attend PT appointment today.  You could alternate lidocaine patch one day then next day [facility].  Your ekg today was again negative. Your prior cardiac work up was negative. If you have signficant constant chest type pain then ED evaluation. If you have persisting atypical transient pain will refer you back to your cardiologist.  You mentioned faint transient intermittent pain left upper calf. If you have pain directly behind knee and constant the will get [place] of lower ext. Or if you have calf swelling will get [place]. Please notify us. If severe pain or calf swelling after hours then ED evaluation.  Follow up in 7 days

## 2016-02-15 NOTE — Progress Notes (Signed)
Subjective:    Patient ID: Lisa Ellis, female    DOB: 05/21/1975, 40 y.o.   MRN: [id number]  HPI  Pt in for evaluation. Pt has concern for cardiac problems. She has had some atypical back/latisimuss dorsi pain in the past. Recent flare and sent me my chart message about concern for her heart. Sept 2016 ekg nsr(today nsr on ekg as well). Cardiologist work up stress echo showed negative stress echo. Cardiologist opinion per note stated.  She had negative stress echocardiogram sure symptoms of chest discomfort resolved. They began about 2 weeks ago she notices subxiphoid discomfort wrapping around into her back. She states it occurs under stressful situations. She's been undergoing increasing amount of work at her job. She states yes he gets relieved after she exercises. It is not exertional. She has no orthopnea no PND syncope or near-syncopal episodes she has no palpitations.  Cardiologist did not think any indication for cardiac catheterization.  Pt saw Dr. Felipe Drone sports medicine who diagnosed with left upper back pain. His plan was to start with physical therapy and prednisone dose pack.  Continue baclofen as needed for spasms. Tylenol, tramadol as needed.  Massage may be helpful as well.  F/u in 5-6 weeks.  Pt state that more of pain is in the left trapezius area and her neck muscle is very tight. She occasional will have very rare pain that will shoot around to left upper chest but very transient pain. Not associated with cardiac type symptoms.  Pt has PT appointment scheduled for today. Pt not sure on her upcoming PT schedule. Pt thinks her prednisone taper after one day felt good for 1-2 days then pain came back. She tried tramadol and it did not help much. It made her sleepy.  Today pt has more pain in left trapezius muscle.  LMP- past Thursday.   Review of Systems  Constitutional: Negative for chills, fatigue and fever.  Respiratory: Negative for cough, chest tightness, shortness  of breath and wheezing.   Cardiovascular: Negative for palpitations.       Atypical chest pain. More pain that.  Gastrointestinal: Negative for abdominal pain.  Musculoskeletal: Positive for back pain.  Skin: Negative for rash.       No rash that pt reports.  Neurological: Negative for dizziness, seizures, weakness, numbness and headaches.  Hematological: Negative for adenopathy. Does not bruise/bleed easily.  Psychiatric/Behavioral: Negative for behavioral problems and confusion.    Past Medical History:  Diagnosis Date  . Allergy   . Asthma    Only wheezed on time associated with pneumonia. none since.  . Depression    only had brief episode in 2006 for couple of months when dad passed away.     Social History   Social History  . Marital status: Unknown    Spouse name: N/A  . Number of children: N/A  . Years of education: N/A   Occupational History  . Not on file.   Social History Main Topics  . Smoking status: Never Smoker  . Smokeless tobacco: Never Used  . Alcohol use No  . Drug use: No  . Sexual activity: Yes   Other Topics Concern  . Not on file   Social History Narrative  . No narrative on file    Past Surgical History:  Procedure Laterality Date  . BREAST SURGERY     augmentation procedure.  . CHOLECYSTECTOMY    . NASAL SINUS SURGERY      Family History  Problem Relation Age  of Onset  . Hypertension Mother   . Diabetes Father   . Hypertension Maternal Grandfather   . Diabetes Paternal Grandmother     No Known Allergies  Current Outpatient Prescriptions on File Prior to Visit  Medication Sig Dispense Refill  . baclofen (LIORESAL) 10 MG tablet Take 1 tablet (10 mg total) by mouth 3 (three) times daily. 30 each 0  . Biotin 1 MG CAPS Take by mouth.    . Calcium Polycarbophil (FIBER-CAPS PO) Take by mouth daily.    . meloxicam (MOBIC) 15 MG tablet Take 1 tablet (15 mg total) by mouth daily. 30 tablet 0  . predniSONE (DELTASONE) 10 MG tablet 6  tabs po day 1, 5 tabs po day 2, 4 tabs po day 3, 3 tabs po day 4, 2 tabs po day 5, 1 tab po day 6 21 tablet 0  . traMADol (ULTRAM) 50 MG tablet Take 1 tablet (50 mg total) by mouth every 8 (eight) hours as needed. 6 tablet 0   No current facility-administered medications on file prior to visit.     BP 104/70 (BP Location: Left Arm, Patient Position: Sitting, Cuff Size: Normal)   Pulse 78   Temp 98 F (36.7 C) (Oral)   Ht 5\' 8"  (1.727 m)   Wt 224 lb 12.8 oz (102 kg)   LMP 02/10/2016   SpO2 98%   BMI 34.18 kg/m       Objective:   Physical Exam  General Mental Status- Alert. General Appearance- Not in acute distress.   Skin General: Color- Normal Color. Moisture- Normal Moisture.  Neck Carotid Arteries- Normal color. Moisture- Normal Moisture. No carotid bruits. No JVD.  Chest and Lung Exam Auscultation: Breath Sounds:-Normal.  Cardiovascular Auscultation:Rythm- Regular. Murmurs & Other Heart Sounds:Auscultation of the heart reveals- No Murmurs.  Abdomen Inspection:-Inspeection Normal. Palpation/Percussion:Note:No mass. Palpation and Percussion of the abdomen reveal- Non Tender, Non Distended + BS, no rebound or guarding.  Neurologic-CN III-XII grossly intact. Normal gross motor and sensory function deficits.  Lower ext- negative homans signs, no calf swelling. No pretibial edema.       Assessment & Plan:  For your trapezius region pain and upper back region pain will give toradol 60 mg im.  Will also rx celebrex for pain and inflammation. Start tomorrow.  Continue current plan by Sports med except prednsione taper dose is finished.  Attend PT appointment today.  You could alternate lidocaine patch one day then next day [facility].  Your ekg today was again negative. Your prior cardiac work up was negative. If you have signficant constant chest type pain then ED evaluation. If you have persisting atypical transient pain will refer you back to your  cardiologist.  You mentioned faint transient intermittent pain left upper calf. If you have pain directly behind knee and constant the will get [place] of lower ext. Or if you have calf swelling will get [place]. Please notify us. If severe pain or calf swelling after hours then ED evaluation.  Follow up in 7 days  Dajanique Robley, Percell Miller, [place]

## 2016-02-16 ENCOUNTER — Ambulatory Visit: Payer: Self-pay | Admitting: Medical

## 2016-02-16 NOTE — Progress Notes (Signed)
Pt has seen results on MyChart and message also sent for patient to call back if any questions.

## 2016-02-23 ENCOUNTER — Ambulatory Visit: Payer: 59 | Attending: Family Medicine | Admitting: Physical Therapy

## 2016-02-23 DIAGNOSIS — M25512 Pain in left shoulder: Secondary | ICD-10-CM | POA: Insufficient documentation

## 2016-02-23 DIAGNOSIS — R293 Abnormal posture: Secondary | ICD-10-CM

## 2016-02-23 DIAGNOSIS — M542 Cervicalgia: Secondary | ICD-10-CM

## 2016-02-23 NOTE — Therapy (Signed)
[facility] [address]  [place], [place], [address] Phone: [phone]   Fax:  [phone]  Physical Therapy Evaluation  Patient Details  Name: Lisa Ellis MRN: [id number] Date of Birth: [birth year] Referring Provider: Karlton Lemon, MD  Encounter Date: 02/23/2016      PT End of Session - 02/23/16 1102    Visit Number 1   Number of Visits 12   Date for PT Re-Evaluation 04/07/16   Authorization Type UHC   Authorization - Number of Visits 23   PT Start Time 1102   PT Stop Time 1146   PT Time Calculation (min) 44 min   Activity Tolerance Patient tolerated treatment well   Behavior During Therapy [facility] for tasks assessed/performed      Past Medical History:  Diagnosis Date  . Allergy   . Asthma    Only wheezed on time associated with pneumonia. none since.  . Depression    only had brief episode in 2006 for couple of months when dad passed away.    Past Surgical History:  Procedure Laterality Date  . BREAST SURGERY     augmentation procedure.  . CHOLECYSTECTOMY    . NASAL SINUS SURGERY      There were no vitals filed for this visit.       Subjective Assessment - 02/23/16 1104    Subjective Pt reporting pain in L upper shoulder and lateral neck x past 6 months, which cannot be alleviated with any meds.   Patient Stated Goals "Alleviate the pain so I can get back to working out"   Currently in Pain? Yes   Pain Score 2   least 1-2/10, avg 4-5/10, worst 8-9/10   Pain Location Scapula   Pain Orientation Left   Pain Descriptors / Indicators Tightness;Sharp   Pain Type Chronic pain   Pain Onset More than a month ago   Pain Frequency Intermittent   Aggravating Factors  working out, lifting objects   Pain Relieving Factors rest   Effect of Pain on Daily Activities unable to workout due to pain            [facility] PT Assessment - 02/23/16 1102      Assessment   Medical Diagnosis L upper  back/scapula pain   Referring Provider Karlton Lemon, MD   Onset Date/Surgical Date --  ~6 months   Hand Dominance Right   Next MD Visit 03/01/16   Prior Therapy PT 11/2014-12-2014 for L sided thoracic pain     Prior Function   Level of Independence Independent   Vocation Full time employment  ~7 hrs/day   Vocation Requirements desk/computer work   Leisure working out, playing with dogs     Observation/Other Assessments   Focus on Therapeutic Outcomes (FOTO)  Thoracic spine 48% (52% limitation); predicted 64% (36% limitation)     Posture/Postural Control   Posture/Postural Control Postural limitations   Postural Limitations Forward head;Rounded Shoulders;Increased thoracic kyphosis   Posture Comments L shoulder/scapula elevated relative to R     ROM / Strength   AROM / PROM / Strength AROM;Strength     AROM   Overall AROM  Within functional limits for tasks performed  B shoulders   AROM Assessment Site Cervical;Shoulder   Cervical Flexion 43  pain/tight   Cervical Extension 60   Cervical - Right Side Bend 36  pain   Cervical - Left Side Bend 53  pain   Cervical - Right Rotation 75  Cervical - Left Rotation 70  pain     Strength   Strength Assessment Site Shoulder   Right/Left Shoulder Right;Left   Right Shoulder Flexion 5/5   Right Shoulder ABduction 5/5   Right Shoulder Internal Rotation 4/5   Right Shoulder External Rotation 4/5   Left Shoulder Flexion 4+/5   Left Shoulder ABduction 4+/5   Left Shoulder Internal Rotation 4+/5   Left Shoulder External Rotation 4/5     Palpation   Palpation comment ttp with increased muscle tension in L UT & LS                   [facility] Adult PT Treatment/Exercise - 02/23/16 1102      Self-Care   Self-Care Posture   Posture Educated pt in neutral spine and shoulder posture and recommended postural "self-check" every 15-30 minutes while working at [employer] Neck;Shoulder     Neck  Exercises: Seated   Neck Retraction 10 reps;5 secs   Shoulder Rolls Backwards;10 reps     Shoulder Exercises: Seated   Retraction Both;10 reps  5" hold     Manual Therapy   Manual Therapy Soft tissue mobilization;Myofascial release   Manual therapy comments B UT, LS & pecs (emphasis on L)     Neck Exercises: Stretches   Upper Trapezius Stretch 30 seconds;2 reps   Upper Trapezius Stretch Limitations seated with hand on edge of seat, unable to tolerate overpressure w/ opposite hand   Levator Stretch 30 seconds;2 reps   Levator Stretch Limitations seated w/ hand behind back   Warehouse manager 30 seconds;3 reps   Corner Stretch Limitations 3-way doorway stretch   Chest Stretch Limitations verbal review of supine pec stretch on 6" foam roll (pt previously has done this during PT last year)                PT Education - 02/23/16 1157    Education provided Yes   Education Details PT eval findings, POC, postural education & initial HEP   Person(s) Educated Patient   Methods Explanation;Demonstration;Handout   Comprehension Verbalized understanding;Returned demonstration;Need further instruction             PT Long Term Goals - 02/23/16 1147      PT LONG TERM GOAL #1   Title Independent with HEP by 04/07/16   Status New     PT LONG TERM GOAL #2   Title Pt will demonstrate neutral spine and shoulder postural awareness at least 75% of the time w/o cues by 04/07/16   Status New     PT LONG TERM GOAL #3   Title Pt will demonstrate cervical ROM WFL w/o pain by 04/07/16   Status New     PT LONG TERM GOAL #4   Title Pt will demonstrate good body mechanics with lifting to prevent upper back/shoulder/neck pain by 04/07/16   Status New     PT LONG TERM GOAL #5   Title Pt will report ability to return to working out w/o limitation due to upper back/shoulder/neck pain by 04/07/16   Status New               Plan - 02/23/16 1146    Clinical Impression Statement [name] is  40 y/o R handed female who presents with ~6 month h/o of worsening L upper back, shoulder/scapular and neck pain. Pt reports current pain at 2/10 in L UT & LS region. Pain does not limit  shoulder motion but pt reports pain limiting cervical ROM in R lateral flexion & L rotation and increases with working out and lifting objects. Assessment revealed B shoulder ROM WNL but cervical ROM slightly limited in flexion, R sidebending and L rotation. Strength testing demonstrates mild overall shoulder weakness with the L shoulder generally  grade weaker than R, and ER weakest bilaterally (refer to above MMT). Postural assessment revealed forward head, increased thoracic kyphosis, and rounded shoulders bilaterally with L shoulder/scapula elevated. Increased muscle tension/tightness noted in B pectoralis, along with L UT & LS. POC will focus on improving postural awareness including increasing muscle flexibility along with soft tissue pliability, scapular strengthening/stabilization, B shoulder strengthening, and manual therapy and modalities PRN for pain. Pt may benefit from TDN and/or kinesiotaping to address muscle tightness and promote muscle relaxation as well as possible trial of TENS for pain control as indicated.   Rehab Potential Good   PT Frequency 2x / week   PT Duration 6 weeks   PT Treatment/Interventions Patient/family education;Manual techniques;Taping;Dry needling;Neuromuscular re-education;Therapeutic exercise;Electrical Stimulation;Moist Heat;Cryotherapy;Iontophoresis 4mg /ml Dexamethasone;Ultrasound;ADLs/Self Care Home Management   PT Next Visit Plan Review initial HEP; Postural training; Cervical ROM; Scapular stabilization/strengthening; Manual therapy and modalities as indicated for pain and increased muscle tension   Consulted and Agree with Plan of Care Patient      Patient will benefit from skilled therapeutic intervention in order to improve the following deficits and impairments:  Pain,  Postural dysfunction, Impaired flexibility, Increased muscle spasms, Decreased range of motion, Decreased strength, Improper body mechanics  Visit Diagnosis: Left shoulder pain, unspecified chronicity  Cervicalgia  Abnormal posture     Problem List Patient Active Problem List   Diagnosis Date Noted  . Wellness examination 12/22/2014  . Atypical chest pain 11/12/2014  . Upper back pain on left side 11/12/2014  . Costochondritis 11/12/2014  . Allergic rhinitis 11/12/2014    [name], PT, MPT 02/23/2016, 12:30 PM  [facility] [address]  [place], [place], [address] Phone: [phone]   Fax:  [phone]  Name: Marquise Doublin MRN: [id number] Date of Birth: March 21, 1975

## 2016-03-01 ENCOUNTER — Ambulatory Visit: Payer: 59 | Admitting: Physical Therapy

## 2016-03-01 ENCOUNTER — Encounter: Payer: Self-pay | Admitting: Family Medicine

## 2016-03-01 ENCOUNTER — Ambulatory Visit (INDEPENDENT_AMBULATORY_CARE_PROVIDER_SITE_OTHER): Payer: 59 | Admitting: Family Medicine

## 2016-03-01 DIAGNOSIS — M549 Dorsalgia, unspecified: Secondary | ICD-10-CM | POA: Diagnosis not present

## 2016-03-01 DIAGNOSIS — R293 Abnormal posture: Secondary | ICD-10-CM

## 2016-03-01 DIAGNOSIS — M25512 Pain in left shoulder: Secondary | ICD-10-CM | POA: Diagnosis not present

## 2016-03-01 DIAGNOSIS — M542 Cervicalgia: Secondary | ICD-10-CM

## 2016-03-01 NOTE — Therapy (Addendum)
[facility] [address]  [place], [place], [address] Phone: [phone]   Fax:  [phone]  Physical Therapy Treatment  Patient Details  Name: Lisa Ellis MRN: [id number] Date of Birth: 29-Sep-1975 Referring Provider: Karlton Lemon, MD  Encounter Date: 03/01/2016      PT End of Session - 03/01/16 0935    Visit Number 2   Number of Visits 12   Date for PT Re-Evaluation 04/07/16   Authorization Type UHC   Authorization - Number of Visits 23   PT Start Time 0935  Pt coming from Dr. Ericka Pontiff office   PT Stop Time 1018   PT Time Calculation (min) 43 min   Activity Tolerance Patient tolerated treatment well   Behavior During Therapy [facility] for tasks assessed/performed      Past Medical History:  Diagnosis Date  . Allergy   . Asthma    Only wheezed on time associated with pneumonia. none since.  . Depression    only had brief episode in 2006 for couple of months when dad passed away.    Past Surgical History:  Procedure Laterality Date  . BREAST SURGERY     augmentation procedure.  . CHOLECYSTECTOMY    . NASAL SINUS SURGERY      There were no vitals filed for this visit.      Subjective Assessment - 03/01/16 0940    Subjective Pt denies any issues with HEP, but does still note some tightness on L. Was able to move boxes around in her office yesterday w/o change in pain.   Patient Stated Goals "Alleviate the pain so I can get back to working out"   Currently in Pain? Yes   Pain Score 1    Pain Location Thoracic   Pain Orientation Left;Upper;Posterior   Pain Descriptors / Indicators Tightness                    [facility] Adult PT Treatment/Exercise - 03/01/16 0935      Neck Exercises: Seated   Neck Retraction 10 reps;5 secs   Shoulder Rolls Backwards;10 reps     Shoulder Exercises: Supine   Horizontal ABduction Both;15 reps;Theraband  5" hold   Theraband Level (Shoulder Horizontal  ABduction) Level 3 (Green)   Horizontal ABduction Limitations hooklying on 6" FR   External Rotation Both;15 reps;Theraband  5" hold   Theraband Level (Shoulder External Rotation) Level 3 (Green)   External Rotation Limitations hooklying on 6" FR     Shoulder Exercises: Seated   Retraction Both;10 reps  5" hold     Shoulder Exercises: ROM/Strengthening   UBE (Upper Arm Bike) lvl 3.0 fwd/back x3' each     Manual Therapy   Manual Therapy Soft tissue mobilization;Myofascial release   Manual therapy comments B UT, LS & pecs (emphasis on L)   Myofascial Release TPR to L UT & LS     Neck Exercises: Stretches   Upper Trapezius Stretch 30 seconds;2 reps   Upper Trapezius Stretch Limitations seated with hand on edge of seat   Levator Stretch 30 seconds;2 reps   Levator Stretch Limitations seated w/ hand behind back   Corner Stretch 30 seconds;3 reps   Corner Stretch Limitations 3-way doorway stretch   Chest Stretch 30 seconds;3 reps   Chest Stretch Limitations varying arm position                PT Education - 03/01/16 1018    Education provided  Yes   Education Details Use of TENS (pt already has a home unit) +/- heat or ice for TP pain   Person(s) Educated Patient   Methods Explanation   Comprehension Verbalized understanding             PT Long Term Goals - 03/01/16 0943      PT LONG TERM GOAL #1   Title Independent with HEP by 04/07/16   Status On-going     PT LONG TERM GOAL #2   Title Pt will demonstrate neutral spine and shoulder postural awareness at least 75% of the time w/o cues by 04/07/16   Status On-going     PT LONG TERM GOAL #3   Title Pt will demonstrate cervical ROM WFL w/o pain by 04/07/16   Status On-going     PT LONG TERM GOAL #4   Title Pt will demonstrate good body mechanics with lifting to prevent upper back/shoulder/neck pain by 04/07/16   Status On-going     PT LONG TERM GOAL #5   Title Pt will report ability to return to working out  w/o limitation due to upper back/shoulder/neck pain by 04/07/16   Status On-going               Plan - 03/01/16 0943    Clinical Impression Statement HEP reviewed with minor corrections/clarifications provided, including reinforcement of postural awareness while performing exercises. Pt continues to report "tightness" in L upper shoulder with significant TPs noted in L UT & LS which responded with good twitch response to TPR. Introduced basic scapular strengthening/stabilization with good pt tol.   Rehab Potential Good   PT Treatment/Interventions Patient/family education;Manual techniques;Taping;Dry needling;Neuromuscular re-education;Therapeutic exercise;Electrical Stimulation;Moist Heat;Cryotherapy;Iontophoresis '4mg'$ /ml Dexamethasone;Ultrasound;ADLs/Self Care Home Management   PT Next Visit Plan Postural training; Cervical ROM including stretching as appropriate; Scapular stabilization/strengthening; Manual therapy and modalities as indicated for pain and increased muscle tension   Consulted and Agree with Plan of Care Patient      Patient will benefit from skilled therapeutic intervention in order to improve the following deficits and impairments:  Pain, Postural dysfunction, Impaired flexibility, Increased muscle spasms, Decreased range of motion, Decreased strength, Improper body mechanics  Visit Diagnosis: Left shoulder pain, unspecified chronicity  Cervicalgia  Abnormal posture     Problem List Patient Active Problem List   Diagnosis Date Noted  . Wellness examination 12/22/2014  . Atypical chest pain 11/12/2014  . Upper back pain on left side 11/12/2014  . Costochondritis 11/12/2014  . Allergic rhinitis 11/12/2014    [name], PT, MPT 03/01/2016, 10:28 AM  [facility] [address]  [place], [place], [address] Phone: [phone]   Fax:  [phone]  Name: Lisa Ellis MRN: [id number] Date  of Birth: [birth year]   PHYSICAL THERAPY DISCHARGE SUMMARY  Visits from Start of Care: 2  Current functional level related to goals / functional outcomes:   Unable to formally assess status at discharge as pt only returned for 1 visit following the eval before calling on 03/09/16 to ask to be placed on hold for 30 days due to hectic work schedule. Pt did note "feeling better" at time of request to be placed on hold.   Remaining deficits:   As above.   Education / Equipment:   HEP  Plan: Patient agrees to discharge.  Patient goals were not met. Patient is being discharged due to not returning since the last visit.  ?????  [name], PT, MPT 04/10/16, 8:08 AM  [facility] [address]  [place], [place], [address] Phone: [phone]   Fax:  [phone]

## 2016-03-02 NOTE — Assessment & Plan Note (Signed)
2/2 strain/spasms of trapezius and rhomboid muscles.  Improving with PT and s/p prednisone.  Continue PT and transition to home exercises.  F/u in 6 weeks or prn.  Tylenol if needed.

## 2016-03-02 NOTE — Progress Notes (Signed)
PCP and consultation requested by: [name], PA-C  Subjective:   HPI: Patient is a 40 y.o. female here for left upper back pain.  11/8: Patient reports for about 6 months she has had worsening problems left upper back. She had problems here previously that responded to 6 weeks of physical therapy and resolved but has since recurred in slightly different area. Tried mobic, baclofen, heat/ice, chiropractic care, massage. No bowel/bladder dysfunction. No numbness or tingling. Pain up to 7/10 level, sharp.  12/20: Patient reports she has improved since last visit. Barely noticed any pain yesterday. Pain currently 1/10 posterior left upper back. Doing physical therapy, home exercises. Occasionally taking baclofen. No bowel/bladder dysfunction. No radiation. No numbness or tingling.  Past Medical History:  Diagnosis Date  . Allergy   . Asthma    Only wheezed on time associated with pneumonia. none since.  . Depression    only had brief episode in 2006 for couple of months when dad passed away.    Current Outpatient Prescriptions on File Prior to Visit  Medication Sig Dispense Refill  . baclofen (LIORESAL) 10 MG tablet Take 1 tablet (10 mg total) by mouth 3 (three) times daily. (Patient not taking: Reported on 02/23/2016) 30 each 0  . Biotin 1 MG CAPS Take by mouth.    . Calcium Polycarbophil (FIBER-CAPS PO) Take by mouth daily.    . celecoxib (CELEBREX) 200 MG capsule Take 1 capsule (200 mg total) by mouth daily. 30 capsule 0  . meloxicam (MOBIC) 15 MG tablet Take 1 tablet (15 mg total) by mouth daily. (Patient not taking: Reported on 02/23/2016) 30 tablet 0  . predniSONE (DELTASONE) 10 MG tablet 6 tabs po day 1, 5 tabs po day 2, 4 tabs po day 3, 3 tabs po day 4, 2 tabs po day 5, 1 tab po day 6 (Patient not taking: Reported on 02/23/2016) 21 tablet 0  . traMADol (ULTRAM) 50 MG tablet Take 1 tablet (50 mg total) by mouth every 8 (eight) hours as needed. (Patient not taking:  Reported on 02/23/2016) 6 tablet 0   No current facility-administered medications on file prior to visit.     Past Surgical History:  Procedure Laterality Date  . BREAST SURGERY     augmentation procedure.  . CHOLECYSTECTOMY    . NASAL SINUS SURGERY      No Known Allergies  Social History   Social History  . Marital status: Unknown    Spouse name: N/A  . Number of children: N/A  . Years of education: N/A   Occupational History  . Not on file.   Social History Main Topics  . Smoking status: Never Smoker  . Smokeless tobacco: Never Used  . Alcohol use No  . Drug use: No  . Sexual activity: Yes   Other Topics Concern  . Not on file   Social History Narrative  . No narrative on file    Family History  Problem Relation Age of Onset  . Hypertension Mother   . Diabetes Father   . Hypertension Maternal Grandfather   . Diabetes Paternal Grandmother     BP (!) 143/79   Pulse 80   Ht 5\' 7"  (1.702 m)   Wt 218 lb (98.9 kg)   LMP 02/10/2016   BMI 34.14 kg/m   Review of Systems: See HPI above.     Objective:  Physical Exam:  Gen: NAD, comfortable in exam room  Neck/upper back: No gross deformity, swelling, bruising. Minimal TTP medial  to left scapula and within left trapezius.  No midline/bony TTP. FROM. BUE strength 5/5.   Sensation intact to light touch.   2+ equal reflexes in triceps, biceps, brachioradialis tendons. Negative spurlings. NV intact distal BUEs.   Assessment & Plan:  1. Left upper back pain - 2/2 strain/spasms of trapezius and rhomboid muscles.  Improving with PT and s/p prednisone.  Continue PT and transition to home exercises.  F/u in 6 weeks or prn.  Tylenol if needed.

## 2016-03-03 ENCOUNTER — Ambulatory Visit: Payer: 59

## 2016-03-08 ENCOUNTER — Ambulatory Visit: Payer: 59

## 2016-03-10 ENCOUNTER — Ambulatory Visit: Payer: 59

## 2016-05-24 ENCOUNTER — Ambulatory Visit: Payer: Self-pay | Admitting: Medical

## 2016-06-15 ENCOUNTER — Encounter: Payer: Self-pay | Admitting: Medical

## 2016-06-15 ENCOUNTER — Ambulatory Visit (INDEPENDENT_AMBULATORY_CARE_PROVIDER_SITE_OTHER): Payer: 59 | Admitting: Medical

## 2016-06-15 VITALS — BP 117/73 | HR 98 | Temp 98.5°F | Resp 16 | Ht 68.0 in | Wt 214.0 lb

## 2016-06-15 DIAGNOSIS — J0100 Acute maxillary sinusitis, unspecified: Secondary | ICD-10-CM | POA: Diagnosis not present

## 2016-06-15 DIAGNOSIS — J301 Allergic rhinitis due to pollen: Secondary | ICD-10-CM

## 2016-06-15 MED ORDER — FLUTICASONE PROPIONATE 50 MCG/ACT NA SUSP: 2.0000 | Freq: Every day | NASAL | 2 refills | Status: DC

## 2016-06-15 MED ORDER — BENZONATATE 100 MG PO CAPS: 100.0000 mg | ORAL_CAPSULE | Freq: Three times a day (TID) | ORAL | 0 refills | Status: DC | PRN

## 2016-06-15 MED ORDER — AZELASTINE HCL 0.1 % NA SOLN: 2.0000 | Freq: Two times a day (BID) | NASAL | 3 refills | Status: DC

## 2016-06-15 MED ORDER — AMOXICILLIN-POT CLAVULANATE 875-125 MG PO TABS: 1.0000 | ORAL_TABLET | Freq: Two times a day (BID) | ORAL | 0 refills | Status: DC

## 2016-06-15 NOTE — Progress Notes (Signed)
Subjective:    Patient ID: Lisa Ellis, female    DOB: 1976/01/18, 41 y.o.   MRN: [id number]  HPI  Pt in states recently past week nasal congestion, runny nose, chest congestion and runny nose. Tuesday night felt feverish. No body aches.   No wheezing.   Pt does have allergies in the spring. Monday and Tuesday she went to park when weather was very nice. Then symptoms got worse. Pt does report sinus pain frontal and maxillary sinus.  LMP- March 16,2018.  Pt has allegra at home.     Review of Systems  Constitutional: Negative for chills, fatigue and fever.       This morning felt feverish with sweats.  HENT: Positive for postnasal drip, sinus pain and sinus pressure. Negative for congestion, facial swelling, nosebleeds and sneezing.   Respiratory: Negative for cough, chest tightness, shortness of breath and wheezing.   Cardiovascular: Negative for chest pain and palpitations.  Gastrointestinal: Negative for abdominal pain.  Genitourinary: Negative for difficulty urinating and dyspareunia.  Musculoskeletal: Negative for back pain, myalgias and neck stiffness.  Skin: Negative for rash.  Neurological: Negative for dizziness, speech difficulty, weakness and headaches.  Hematological: Negative for adenopathy. Does not bruise/bleed easily.  Psychiatric/Behavioral: Negative for behavioral problems and confusion.    Past Medical History:  Diagnosis Date  . Allergy   . Asthma    Only wheezed on time associated with pneumonia. none since.  . Depression    only had brief episode in 2006 for couple of months when dad passed away.     Social History   Social History  . Marital status: Unknown    Spouse name: N/A  . Number of children: N/A  . Years of education: N/A   Occupational History  . Not on file.   Social History Main Topics  . Smoking status: Never Smoker  . Smokeless tobacco: Never Used  . Alcohol use No  . Drug use: No  . Sexual activity: Yes   Other Topics  Concern  . Not on file   Social History Narrative  . No narrative on file    Past Surgical History:  Procedure Laterality Date  . BREAST SURGERY     augmentation procedure.  . CHOLECYSTECTOMY    . NASAL SINUS SURGERY      Family History  Problem Relation Age of Onset  . Hypertension Mother   . Diabetes Father   . Hypertension Maternal Grandfather   . Diabetes Paternal Grandmother     No Known Allergies  No current outpatient prescriptions on file prior to visit.   No current facility-administered medications on file prior to visit.     BP 117/73 (BP Location: Left Arm, Patient Position: Sitting, Cuff Size: Large)   Pulse 98   Temp 98.5 F (36.9 C) (Oral)   Resp 16   Ht 5\' 8"  (1.727 m)   Wt 214 lb (97.1 kg)   LMP 05/26/2016   SpO2 98%   BMI 32.54 kg/m       Objective:   Physical Exam  General  Mental Status - Alert. General Appearance - Well groomed. Not in acute distress.  Skin Rashes- No Rashes.  HEENT Head- Normal. Ear Auditory Canal - Left- Normal. Right - Normal.Tympanic Membrane- Left- Normal. Right- Normal. Eye Sclera/Conjunctiva- Left- Normal. Right- Normal. Nose & Sinuses Nasal Mucosa- Left-  Boggy and Congested. Right-  Boggy and  Congested.Bilateral faint  maxillary and faint  frontal sinus pressure.(possible mild deviated septum) Mouth &  Throat Lips: Upper Lip- Normal: no dryness, cracking, pallor, cyanosis, or vesicular eruption. Lower Lip-Normal: no dryness, cracking, pallor, cyanosis or vesicular eruption. Buccal Mucosa- Bilateral- No Aphthous ulcers. Oropharynx- No Discharge or Erythema. +pnd Tonsils: Characteristics- Bilateral- No Erythema or Congestion. Size/Enlargement- Bilateral- No enlargement. Discharge- bilateral-None.  Neck Neck- Supple. No Masses.   Chest and Lung Exam Auscultation: Breath Sounds:-Clear even and unlabored.  Cardiovascular Auscultation:Rythm- Regular, rate and rhythm. Murmurs & Other Heart  Sounds:Ausculatation of the heart reveal- No Murmurs.  Lymphatic Head & Neck General Head & Neck Lymphatics: Bilateral: Description- No Localized lymphadenopathy.       Assessment & Plan:  You appear to have a sinus infection. I am prescribing augmentin  antibiotic for the infection. To help with the nasal congestion use flonase nasal steroid and astelin. For your associated cough, I prescribed cough medicine benzonatate.  Can continue allegra.  Rest, hydrate, tylenol for fever.  Follow up in 7 days or as needed.  If 2 nasal sprays not controlling allergy symptoms and recurrent sinus infections then refer to ENT.  Ruthy Forry, Percell Miller, PA-C

## 2016-06-15 NOTE — Progress Notes (Signed)
Pre visit review using our clinic review tool, if applicable. No additional management support is needed unless otherwise documented below in the visit note. 

## 2016-06-15 NOTE — Patient Instructions (Addendum)
You appear to have a sinus infection. I am prescribing augmentin  antibiotic for the infection. To help with the nasal congestion use flonase nasal steroid and astelin. For your associated cough, I prescribed cough medicine benzonatate.  Can continue allegra.  Rest, hydrate, tylenol for fever.  Follow up in 7 days or as needed.  If 2 nasal sprays not controlling allergy symptoms and recurrent sinus infections then refer to ENT.

## 2016-06-17 ENCOUNTER — Encounter: Payer: Self-pay | Admitting: Medical

## 2016-06-20 MED ORDER — HYDROCODONE-HOMATROPINE 5-1.5 MG/5ML PO SYRP: 5.0000 mL | ORAL_SOLUTION | Freq: Three times a day (TID) | ORAL | 0 refills | Status: DC | PRN

## 2016-06-20 NOTE — Telephone Encounter (Signed)
Prescription of hycodan printed. I will sign the rx tomorrow. Advise her she can pick up rx tomorrow.

## 2016-07-21 ENCOUNTER — Ambulatory Visit (INDEPENDENT_AMBULATORY_CARE_PROVIDER_SITE_OTHER): Payer: 59 | Admitting: Medical

## 2016-07-21 VITALS — BP 107/61 | HR 78 | Temp 98.5°F | Resp 16 | Ht 68.0 in | Wt 213.0 lb

## 2016-07-21 DIAGNOSIS — M25552 Pain in left hip: Secondary | ICD-10-CM

## 2016-07-21 DIAGNOSIS — M25562 Pain in left knee: Secondary | ICD-10-CM | POA: Diagnosis not present

## 2016-07-21 DIAGNOSIS — M25572 Pain in left ankle and joints of left foot: Secondary | ICD-10-CM

## 2016-07-21 DIAGNOSIS — F419 Anxiety disorder, unspecified: Secondary | ICD-10-CM | POA: Diagnosis not present

## 2016-07-21 DIAGNOSIS — M898X6 Other specified disorders of bone, lower leg: Secondary | ICD-10-CM

## 2016-07-21 MED ORDER — DICLOFENAC SODIUM 75 MG PO TBEC: 75.0000 mg | DELAYED_RELEASE_TABLET | Freq: Two times a day (BID) | ORAL | 0 refills | Status: DC

## 2016-07-21 MED ORDER — TRAMADOL HCL 50 MG PO TABS: 50.0000 mg | ORAL_TABLET | Freq: Four times a day (QID) | ORAL | 0 refills | Status: DC | PRN

## 2016-07-21 MED ORDER — CLONAZEPAM 0.25 MG PO TBDP: 0.2500 mg | ORAL_TABLET | Freq: Every day | ORAL | 0 refills | Status: DC

## 2016-07-21 NOTE — Progress Notes (Signed)
Subjective:    Patient ID: Lisa Ellis, female    DOB: 10-19-1975, 41 y.o.   MRN: [id number]  HPI  Pt in with some pain in her left ankle, left knee and left hip. Pt states her ankle and distal lower ext hurt at first. Pt has history of old fracture in ankle about 15 years ago.   Pt has been exercising a lot. Pt has been jogging a lot recently. Has been jogging 6 miles a day. Pt took 2 days off and pain eased up. But then when restarted pain came back. Pt exercising for at least a month.  Pt is member of [employer].  LMP- Jul 15, 2016.  Review of Systems  Constitutional: Negative for chills, fatigue and fever.  Respiratory: Negative for cough, chest tightness, shortness of breath and wheezing.   Cardiovascular: Negative for chest pain and palpitations.  Musculoskeletal:       Lt ankle, left knee and hip pain.   Skin: Negative for rash.  Neurological: Negative for dizziness, weakness, numbness and headaches.  Hematological: Negative for adenopathy. Does not bruise/bleed easily.  Psychiatric/Behavioral: Negative for behavioral problems and confusion. The patient is nervous/anxious.        About flying and some when on cruise ship. Trouble sleeping.    Past Medical History:  Diagnosis Date  . Allergy   . Asthma    Only wheezed on time associated with pneumonia. none since.  . Depression    only had brief episode in 2006 for couple of months when dad passed away.     Social History   Social History  . Marital status: Unknown    Spouse name: N/A  . Number of children: N/A  . Years of education: N/A   Occupational History  . Not on file.   Social History Main Topics  . Smoking status: Never Smoker  . Smokeless tobacco: Never Used  . Alcohol use No  . Drug use: No  . Sexual activity: Yes   Other Topics Concern  . Not on file   Social History Narrative  . No narrative on file    Past Surgical History:  Procedure Laterality Date  . BREAST SURGERY     augmentation procedure.  . CHOLECYSTECTOMY    . NASAL SINUS SURGERY      Family History  Problem Relation Age of Onset  . Hypertension Mother   . Diabetes Father   . Hypertension Maternal Grandfather   . Diabetes Paternal Grandmother     No Known Allergies  Current Outpatient Prescriptions on File Prior to Visit  Medication Sig Dispense Refill  . azelastine (ASTELIN) 0.1 % nasal spray Place 2 sprays into both nostrils 2 (two) times daily. Use in each nostril as directed 30 mL 3  . benzonatate (TESSALON) 100 MG capsule Take 1 capsule (100 mg total) by mouth 3 (three) times daily as needed for cough. 21 capsule 0  . fluticasone (FLONASE) 50 MCG/ACT nasal spray Place 2 sprays into both nostrils daily. 16 g 2   No current facility-administered medications on file prior to visit.     BP 107/61 (BP Location: Right Arm, Patient Position: Sitting, Cuff Size: Large)   Pulse 78   Temp 98.5 F (36.9 C) (Oral)   Resp 16   Ht 5\' 8"  (1.727 m)   Wt 212 lb 15.7 oz (96.6 kg)   SpO2 100%   BMI 32.38 kg/m       Objective:   Physical Exam  General-  No acute distress. Pleasant patient. Neck- Full range of motion, no jvd Lungs- Clear, even and unlabored. Heart- regular rate and rhythm. Neurologic- CNII- XII grossly intact.   Lt ankle- no swelling. No pain on palpation but pain anterior ankle plantar flexion.  Left tibia/fibula- distal faint tenderness.  Left knee- good range of motion. No crepitus. No pain on palpation. Left hip- good range of motion. No pain on palpation.      Assessment & Plan:  For your ankle and tibia pain will get xrays. Recommend take break from jogging. Try elliptical to see if joints feel better. Rx diclofenac for pain/inflammation. For more severe pain/while on vacation making tramadol available.  For anxiety and insomnia while on trip rx limited number of clonazepam.  Follow up in 10-14 days or as needed.  After discussion we decided not xray knee  and hip. But if these area become more painful will get xray.  Marri Mcneff, Percell Miller, PA-C

## 2016-07-21 NOTE — Patient Instructions (Signed)
For your ankle and tibia pain will get xrays. Recommend take break from jogging. Try elliptical to see if joints feel better. Rx diclofenac for pain/inflammation. For more severe pain/while on vacation making tramadol available.  For anxiety and insomnia while on trip rx limited number of clonazepam.  Follow up in 10-14 days or as needed.  After discussion we decided not xray knee and hip. But if these area become more painful will get xray.

## 2016-07-25 ENCOUNTER — Telehealth: Payer: Self-pay | Admitting: Medical

## 2016-07-25 ENCOUNTER — Ambulatory Visit (HOSPITAL_BASED_OUTPATIENT_CLINIC_OR_DEPARTMENT_OTHER)
Admission: RE | Admit: 2016-07-25 | Discharge: 2016-07-25 | Disposition: A | Discharge status: Home / Self Care | Payer: 59 | Source: Ambulatory Visit | Attending: Medical | Admitting: Medical

## 2016-07-25 DIAGNOSIS — M25572 Pain in left ankle and joints of left foot: Secondary | ICD-10-CM

## 2016-07-25 DIAGNOSIS — M898X6 Other specified disorders of bone, lower leg: Secondary | ICD-10-CM | POA: Insufficient documentation

## 2016-07-25 NOTE — Telephone Encounter (Signed)
Imaging called to inform provider that he double ordered XRay. They will be doing only one which will give him the view he needs.

## 2016-10-18 ENCOUNTER — Ambulatory Visit: Payer: Self-pay | Admitting: Medical

## 2016-10-19 ENCOUNTER — Encounter: Payer: Self-pay | Admitting: Medical

## 2016-10-19 ENCOUNTER — Ambulatory Visit: Payer: Self-pay | Admitting: Medical

## 2016-10-19 ENCOUNTER — Ambulatory Visit (INDEPENDENT_AMBULATORY_CARE_PROVIDER_SITE_OTHER): Payer: 59 | Admitting: Medical

## 2016-10-19 VITALS — BP 115/70 | HR 66 | Temp 98.3°F | Resp 16 | Ht 68.0 in | Wt 223.0 lb

## 2016-10-19 DIAGNOSIS — M5441 Lumbago with sciatica, right side: Secondary | ICD-10-CM

## 2016-10-19 MED ORDER — KETOROLAC TROMETHAMINE 60 MG/2ML IM SOLN
60.0000 mg | Freq: Once | INTRAMUSCULAR | Status: AC
  Administered 2016-10-19: 60 mg via INTRAMUSCULAR

## 2016-10-19 MED ORDER — CYCLOBENZAPRINE HCL 10 MG PO TABS
10.0000 mg | ORAL_TABLET | Freq: Every day | ORAL | 0 refills | Status: DC
Start: 2016-10-19 — End: 2016-11-02

## 2016-10-19 MED ORDER — DICLOFENAC SODIUM 75 MG PO TBEC: 75.0000 mg | DELAYED_RELEASE_TABLET | Freq: Two times a day (BID) | ORAL | 0 refills | Status: DC

## 2016-10-19 MED ORDER — HYDROCODONE-ACETAMINOPHEN 5-325 MG PO TABS: 1.0000 | ORAL_TABLET | Freq: Four times a day (QID) | ORAL | 0 refills | Status: DC | PRN

## 2016-10-19 NOTE — Patient Instructions (Addendum)
For low back pain with sciatica we gave your toradol 60 mg im.  For pain can start diclofenac tomorrow. Flexeril muscle relaxant at night.  For more severe pain norco limited rx.  Will get xray of lumbar spine today.  Follow up in 7-10 days or as needed  Red flag signs and symptoms reviewed that would require ED eval.

## 2016-10-19 NOTE — Progress Notes (Signed)
Subjective:    Patient ID: Lisa Ellis, female    DOB: 06-Jul-1975, 41 y.o.   MRN: [id number]  HPI  Pt in for low back, rt si area pain and pain shooting from rt si area to rt mid buttck. Rt lateral foot feels faint numb. No radiating pain down entire leg.  Pt has been going to chiropracter. He tried to adjust but it has not helped.  Pain present since Sunday. Pain still high level pain but not as bad as Sunday. She could not stand at times due to pain on first day. Now able to stand and ambulate but moderate-severe pain.  Pt had low level 1-2/10 level lower back pain that is very self limited and resolves otc meds would help. Also chiropracter manipulation would help in past but not now.  This is first time severe pain shooting down her leg.  lmp- 2 weeks ago.   Review of Systems  Constitutional: Negative for chills, fatigue and fever.  Respiratory: Negative for cough, chest tightness, shortness of breath and wheezing.   Cardiovascular: Negative for chest pain and palpitations.  Gastrointestinal: Negative for abdominal pain.  Musculoskeletal: Positive for back pain.  Skin: Negative for rash.  Neurological: Negative for dizziness, seizures, weakness, numbness and headaches.       Some pain radiating rt si area to rt mid buttock. No loss of bladder function. No incontence or saddle anesthesia.  Some lateral rt foot numb sensation.  Hematological: Negative for adenopathy. Does not bruise/bleed easily.    Past Medical History:  Diagnosis Date  . Allergy   . Asthma    Only wheezed on time associated with pneumonia. none since.  . Depression    only had brief episode in 2006 for couple of months when dad passed away.     Social History   Social History  . Marital status: Unknown    Spouse name: N/A  . Number of children: N/A  . Years of education: N/A   Occupational History  . Not on file.   Social History Main Topics  . Smoking status: Never Smoker  . Smokeless  tobacco: Never Used  . Alcohol use No  . Drug use: No  . Sexual activity: Yes   Other Topics Concern  . Not on file   Social History Narrative  . No narrative on file    Past Surgical History:  Procedure Laterality Date  . BREAST SURGERY     augmentation procedure.  . CHOLECYSTECTOMY    . NASAL SINUS SURGERY      Family History  Problem Relation Age of Onset  . Hypertension Mother   . Diabetes Father   . Hypertension Maternal Grandfather   . Diabetes Paternal Grandmother     No Known Allergies  Current Outpatient Prescriptions on File Prior to Visit  Medication Sig Dispense Refill  . azelastine (ASTELIN) 0.1 % nasal spray Place 2 sprays into both nostrils 2 (two) times daily. Use in each nostril as directed (Patient not taking: Reported on 10/19/2016) 30 mL 3  . clonazePAM (KLONOPIN) 0.25 MG disintegrating tablet Take 1 tablet (0.25 mg total) by mouth at bedtime. (Patient not taking: Reported on 10/19/2016) 9 tablet 0  . diclofenac (VOLTAREN) 75 MG EC tablet Take 1 tablet (75 mg total) by mouth 2 (two) times daily. (Patient not taking: Reported on 10/19/2016) 30 tablet 0  . fluticasone (FLONASE) 50 MCG/ACT nasal spray Place 2 sprays into both nostrils daily. (Patient not taking: Reported on 10/19/2016) 16  g 2  . traMADol (ULTRAM) 50 MG tablet Take 1 tablet (50 mg total) by mouth every 6 (six) hours as needed. (Patient not taking: Reported on 10/19/2016) 8 tablet 0   No current facility-administered medications on file prior to visit.     BP 115/70   Pulse 66   Temp 98.3 F (36.8 C) (Oral)   Resp 16   Ht 5\' 8"  (1.727 m)   Wt 223 lb (101.2 kg)   SpO2 100%   BMI 33.91 kg/m       Objective:   Physical Exam  General Appearance- Not in acute distress.    Chest and Lung Exam Auscultation: Breath sounds:-Normal. Clear even and unlabored. Adventitious sounds:- No Adventitious sounds.  Cardiovascular Auscultation:Rythm - Regular, rate and rythm. Heart Sounds -Normal  heart sounds.  Abdomen Inspection:-Inspection Normal.  Palpation/Perucssion: Palpation and Percussion of the abdomen reveal- Non Tender, No Rebound tenderness, No rigidity(Guarding) and No Palpable abdominal masses.  Liver:-Normal.  Spleen:- Normal.   Back No Mid lumbar spine tenderness to palpation.(rt si tenderenss to palpation) Pain on straight leg lift. Pain on lateral movements and flexion/extension of the spine.  Lower ext neurologic  L5-S1 sensation intact bilaterally.(but lateral rt aspect of foot feels slight numb) Normal patellar reflexes bilaterally. No foot drop bilaterally.      Assessment & Plan:  For low back pain with sciatica we gave your toradol 60 mg im.  For pain can start diclofenac tomorrow. Flexeril muscle relaxant at night.  For more severe pain norco limited rx.  Will get xray of lumbar spine today.  Follow up in 7-10 days or as needed

## 2016-10-26 ENCOUNTER — Ambulatory Visit (HOSPITAL_BASED_OUTPATIENT_CLINIC_OR_DEPARTMENT_OTHER)
Admission: RE | Admit: 2016-10-26 | Discharge: 2016-10-26 | Disposition: A | Discharge status: Home / Self Care | Payer: 59 | Source: Ambulatory Visit | Attending: Medical | Admitting: Medical

## 2016-10-26 ENCOUNTER — Encounter: Payer: Self-pay | Admitting: Medical

## 2016-10-26 DIAGNOSIS — M5441 Lumbago with sciatica, right side: Secondary | ICD-10-CM | POA: Diagnosis present

## 2016-10-31 ENCOUNTER — Other Ambulatory Visit: Payer: Self-pay

## 2016-10-31 ENCOUNTER — Ambulatory Visit (INDEPENDENT_AMBULATORY_CARE_PROVIDER_SITE_OTHER): Payer: 59 | Admitting: Medical

## 2016-10-31 MED ORDER — HYDROCODONE-ACETAMINOPHEN 5-325 MG PO TABS: 1.0000 | ORAL_TABLET | Freq: Four times a day (QID) | ORAL | 0 refills | Status: DC | PRN

## 2016-11-02 ENCOUNTER — Ambulatory Visit (INDEPENDENT_AMBULATORY_CARE_PROVIDER_SITE_OTHER): Payer: 59 | Admitting: Medical

## 2016-11-02 DIAGNOSIS — M5442 Lumbago with sciatica, left side: Secondary | ICD-10-CM

## 2016-11-02 DIAGNOSIS — M5410 Radiculopathy, site unspecified: Secondary | ICD-10-CM | POA: Diagnosis not present

## 2016-11-02 MED ORDER — DICLOFENAC SODIUM 75 MG PO TBEC: 75.0000 mg | DELAYED_RELEASE_TABLET | Freq: Two times a day (BID) | ORAL | 0 refills | Status: DC

## 2016-11-02 MED ORDER — HYDROCODONE-ACETAMINOPHEN 5-325 MG PO TABS: 1.0000 | ORAL_TABLET | Freq: Four times a day (QID) | ORAL | 0 refills | Status: DC | PRN

## 2016-11-02 MED ORDER — CYCLOBENZAPRINE HCL 10 MG PO TABS: 10.0000 mg | ORAL_TABLET | Freq: Every day | ORAL | 0 refills | Status: DC

## 2016-11-02 NOTE — Patient Instructions (Signed)
For your persistent back pain with radiating features to right foot will go ahead and put MRI order for the lumbar spine.  If get denial from the insurance company then will refer to sport medicine.  Will refill your diclofenac, flexeril and norco.  If pain persists will place you on contract. Get you to sign and give uds.  Red flags signs and symptoms reviewed that would indicate need to emergent mri.  Follow up in 7-10 days or as needed

## 2016-11-02 NOTE — Progress Notes (Signed)
Subjective:    Patient ID: Lisa Ellis, female    DOB: Jan 19, 1976, 41 y.o.   MRN: [id number]  HPI  Pt in for follow up continue with lower back pain with radiating pain down her rt leg. No saddle anesthesia. No incontinence. No constant leg weakness. Though occasional when has to stand on tip toes rt ankle feels transient weak.  Pt symptoms have been present for 3 weeks.xray of her lumbar spine was normal.  Pain level moderate to severe. Almost to point of tears at time. Presently pain level 6/10.  Pain does not eliminate pain completely. Sometimes pain stating 8-9/10.  Review of Systems  Constitutional: Negative for chills and fatigue.  Respiratory: Negative for cough, chest tightness and wheezing.   Cardiovascular: Negative for chest pain and palpitations.  Gastrointestinal: Negative for abdominal pain, blood in stool and nausea.  Genitourinary: Negative for decreased urine volume, difficulty urinating, flank pain, frequency, genital sores and pelvic pain.  Musculoskeletal: Positive for back pain. Negative for myalgias and neck stiffness.       Pt still has pain in back and seems to run to rt buttox. Some pain that runs to rt lateral calf at time. Has some numbness to lateral calf down to lateral foot.   Neurological: Negative for weakness.    Past Medical History:  Diagnosis Date  . Allergy   . Asthma    Only wheezed on time associated with pneumonia. none since.  . Depression    only had brief episode in 2006 for couple of months when dad passed away.     Social History   Social History  . Marital status: Unknown    Spouse name: N/A  . Number of children: N/A  . Years of education: N/A   Occupational History  . Not on file.   Social History Main Topics  . Smoking status: Never Smoker  . Smokeless tobacco: Never Used  . Alcohol use No  . Drug use: No  . Sexual activity: Yes   Other Topics Concern  . Not on file   Social History Narrative  . No narrative on  file    Past Surgical History:  Procedure Laterality Date  . BREAST SURGERY     augmentation procedure.  . CHOLECYSTECTOMY    . NASAL SINUS SURGERY      Family History  Problem Relation Age of Onset  . Hypertension Mother   . Diabetes Father   . Hypertension Maternal Grandfather   . Diabetes Paternal Grandmother     No Known Allergies  Current Outpatient Prescriptions on File Prior to Visit  Medication Sig Dispense Refill  . azelastine (ASTELIN) 0.1 % nasal spray Place 2 sprays into both nostrils 2 (two) times daily. Use in each nostril as directed 30 mL 3  . clonazePAM (KLONOPIN) 0.25 MG disintegrating tablet Take 1 tablet (0.25 mg total) by mouth at bedtime. 9 tablet 0  . cyclobenzaprine (FLEXERIL) 10 MG tablet Take 1 tablet (10 mg total) by mouth at bedtime. 7 tablet 0  . diclofenac (VOLTAREN) 75 MG EC tablet Take 1 tablet (75 mg total) by mouth 2 (two) times daily. 30 tablet 0  . fluticasone (FLONASE) 50 MCG/ACT nasal spray Place 2 sprays into both nostrils daily. 16 g 2  . HYDROcodone-acetaminophen (NORCO) 5-325 MG tablet Take 1 tablet by mouth every 6 (six) hours as needed for moderate pain. 6 tablet 0   No current facility-administered medications on file prior to visit.     LMP  10/25/2016       Objective:   Physical Exam  General Appearance- Not in acute distress.    Chest and Lung Exam Auscultation: Breath sounds:-Normal. Clear even and unlabored. Adventitious sounds:- No Adventitious sounds.  Cardiovascular Auscultation:Rythm - Regular, rate and rythm. Heart Sounds -Normal heart sounds.  Abdomen Inspection:-Inspection Normal.  Palpation/Perucssion: Palpation and Percussion of the abdomen reveal- Non Tender, No Rebound tenderness, No rigidity(Guarding) and No Palpable abdominal masses.  Liver:-Normal.  Spleen:- Normal.   Back No Mid lumbar spine tenderness to palpation. Rt si tenderness directly on palpaiton Pain on straight leg lift. Pain on  lateral movements and flexion/extension of the spine.  Lower ext neurologic  L5-S1 sensation intact bilaterally. Normal patellar reflexes bilaterally. No foot drop bilaterally. Normal symmetric 5/5 lower ext strength.      Assessment & Plan:  For your persistent back pain with radiating features to right foot will go ahead and put MRI order for the lumbar spine.  If get denial from the insurance company then will refer to sport medicine.  Will refill your diclofenac, flexeril and norco.  If pain persists will place you on contract. Get you to sign and give uds.  Red flags signs and symptoms reviewed that would indicate need to emergent mri.  Follow up in 7-10 days or as needed  Jakaiden Fill, Percell Miller, [employer]

## 2016-11-07 ENCOUNTER — Telehealth: Payer: Self-pay | Admitting: Medical

## 2016-11-07 NOTE — Telephone Encounter (Signed)
Regarding patient's denial for MRI, will you call her and see how she is doing. If patient has same level pain and still radiating and numbness to lower extremity will refer her to sports medicine. Let me know what she says.

## 2016-11-08 NOTE — Telephone Encounter (Signed)
Pt states pay is easing up some. Pt will let us know Tuesday if she want to go through with referral.

## 2016-12-19 ENCOUNTER — Ambulatory Visit: Payer: 59 | Admitting: Medical

## 2016-12-19 ENCOUNTER — Telehealth: Payer: Self-pay | Admitting: Medical

## 2016-12-19 NOTE — Telephone Encounter (Signed)
DOS 12/19/16 Pt called 0845 to cancel 0915 appt states she forgot and her symptoms have resolved so she did not reschedule at this time.

## 2016-12-19 NOTE — Telephone Encounter (Signed)
No charge. 

## 2017-01-15 ENCOUNTER — Other Ambulatory Visit (HOSPITAL_BASED_OUTPATIENT_CLINIC_OR_DEPARTMENT_OTHER): Payer: Self-pay | Admitting: Obstetrics and Gynecology

## 2017-01-15 DIAGNOSIS — Z1231 Encounter for screening mammogram for malignant neoplasm of breast: Secondary | ICD-10-CM

## 2017-01-31 ENCOUNTER — Ambulatory Visit: Payer: 59 | Admitting: Medical

## 2017-01-31 ENCOUNTER — Ambulatory Visit (HOSPITAL_BASED_OUTPATIENT_CLINIC_OR_DEPARTMENT_OTHER)
Admission: RE | Admit: 2017-01-31 | Discharge: 2017-01-31 | Disposition: A | Discharge status: Home / Self Care | Payer: 59 | Source: Ambulatory Visit | Attending: Medical | Admitting: Medical

## 2017-01-31 ENCOUNTER — Encounter: Payer: Self-pay | Admitting: Medical

## 2017-01-31 VITALS — BP 133/74 | HR 77 | Temp 98.0°F | Resp 16 | Wt 230.8 lb

## 2017-01-31 DIAGNOSIS — M79672 Pain in left foot: Secondary | ICD-10-CM | POA: Diagnosis not present

## 2017-01-31 LAB — URIC ACID: Uric Acid, Serum: 7.7 mg/dL — ABNORMAL HIGH (ref 2.4–7.0)

## 2017-01-31 MED ORDER — DICLOFENAC SODIUM 75 MG PO TBEC: 75.0000 mg | DELAYED_RELEASE_TABLET | Freq: Two times a day (BID) | ORAL | 0 refills | Status: DC

## 2017-01-31 NOTE — Patient Instructions (Signed)
For foot pain will get xray of your foot today and get uric acid as well.  Will rx diclofenac for pain.   May refer to podiatrist after review since flat feet may play a role in your pain.  Follow up date to be determined.

## 2017-01-31 NOTE — Progress Notes (Signed)
Subjective:    Patient ID: Lisa Ellis, female    DOB: 10-18-1975, 41 y.o.   MRN: [id number]  HPI  Pt in for follow up.  She states she is very stressed recently over past 2 months. 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 But not here for stress or anxiety. But notes stress eating and gaining some weight.  Pt has pain in her left foot on medial aspect. Pain since October. While she was on vacation she had acute swelling of her foot with pain. Swelling went down but pain never went away completely. Pain on palpation. Pt note at times when takes pressure off foot after walking will have pain.    Review of Systems  Constitutional: Negative for chills and fatigue.  HENT: Negative for congestion.   Respiratory: Negative for cough, chest tightness, shortness of breath and wheezing.   Cardiovascular: Negative for chest pain and palpitations.  Gastrointestinal: Negative for abdominal pain.  Musculoskeletal:       Foot pain.  Skin: Negative for rash.  Neurological: Negative for dizziness, weakness and numbness.  Hematological: Negative for adenopathy. Does not bruise/bleed easily.  Psychiatric/Behavioral: Negative for behavioral problems and confusion. The patient is not nervous/anxious.    Past Medical History:  Diagnosis Date  . Allergy   . Asthma    Only wheezed on time associated with pneumonia. none since.  . Depression    only had brief episode in 2006 for couple of months when dad passed away.     Social History   Socioeconomic History  . Marital status: Unknown    Spouse name: Not on file  . Number of children: Not on file  . Years of education: Not on file  . Highest education level: Not on file  Social Needs  . Financial resource strain: Not on file  . Food insecurity - worry: Not on file  . Food insecurity - inability: Not on file  . Transportation needs - medical: Not on file  . Transportation needs - non-medical: Not on file  Occupational History    . Not on file  Tobacco Use  . Smoking status: Never Smoker  . Smokeless tobacco: Never Used  Substance and Sexual Activity  . Alcohol use: No    Alcohol/week: 0.0 oz  . Drug use: No  . Sexual activity: Yes  Other Topics Concern  . Not on file  Social History Narrative  . Not on file    Past Surgical History:  Procedure Laterality Date  . BREAST SURGERY     augmentation procedure.  . CHOLECYSTECTOMY    . NASAL SINUS SURGERY      Family History  Problem Relation Age of Onset  . Hypertension Mother   . Diabetes Father   . Hypertension Maternal Grandfather   . Diabetes Paternal Grandmother     No Known Allergies  Current Outpatient Medications on File Prior to Visit  Medication Sig Dispense Refill  . azelastine (ASTELIN) 0.1 % nasal spray Place 2 sprays into both nostrils 2 (two) times daily. Use in each nostril as directed (Patient not taking: Reported on 01/31/2017) 30 mL 3  . clonazePAM (KLONOPIN) 0.25 MG disintegrating tablet Take 1 tablet (0.25 mg total) by mouth at bedtime. (Patient not taking: Reported on 01/31/2017) 9 tablet 0  . cyclobenzaprine (FLEXERIL) 10 MG tablet Take 1 tablet (10 mg total) by mouth at bedtime. (Patient not taking: Reported on 01/31/2017) 14 tablet 0  . diclofenac (VOLTAREN) 75 MG EC tablet Take 1  tablet (75 mg total) by mouth 2 (two) times daily. (Patient not taking: Reported on 01/31/2017) 30 tablet 0  . fluticasone (FLONASE) 50 MCG/ACT nasal spray Place 2 sprays into both nostrils daily. (Patient not taking: Reported on 01/31/2017) 16 g 2  . HYDROcodone-acetaminophen (NORCO) 5-325 MG tablet Take 1 tablet by mouth every 6 (six) hours as needed for moderate pain. (Patient not taking: Reported on 01/31/2017) 16 tablet 0   No current facility-administered medications on file prior to visit.     BP 133/74   Pulse 77   Temp 98 F (36.7 C) (Oral)   Resp 16   Wt 230 lb 12.8 oz (104.7 kg)   SpO2 100%   BMI 35.09 kg/m       Objective:    Physical Exam   General- No acute distress. Pleasant patient.  Lungs- Clear, even and unlabored. Heart- regular rate and rhythm. Neurologic- CNII- XII grossly intact.  Left foot- medial aspect moderate tender to palpation. No redness warmth or tenderness.  Left ankle- no pain.        Assessment & Plan:

## 2017-02-16 ENCOUNTER — Ambulatory Visit (HOSPITAL_BASED_OUTPATIENT_CLINIC_OR_DEPARTMENT_OTHER)
Admission: RE | Admit: 2017-02-16 | Discharge: 2017-02-16 | Disposition: A | Discharge status: Home / Self Care | Payer: 59 | Source: Ambulatory Visit | Attending: Obstetrics and Gynecology | Admitting: Obstetrics and Gynecology

## 2017-02-16 ENCOUNTER — Encounter (HOSPITAL_BASED_OUTPATIENT_CLINIC_OR_DEPARTMENT_OTHER): Payer: Self-pay

## 2017-02-16 DIAGNOSIS — Z1231 Encounter for screening mammogram for malignant neoplasm of breast: Secondary | ICD-10-CM | POA: Diagnosis not present

## 2017-03-07 ENCOUNTER — Ambulatory Visit: Payer: 59 | Admitting: Urgent Care

## 2017-03-07 ENCOUNTER — Ambulatory Visit: Payer: Self-pay | Admitting: *Deleted

## 2017-03-07 ENCOUNTER — Encounter: Payer: Self-pay | Admitting: Urgent Care

## 2017-03-07 ENCOUNTER — Telehealth: Payer: Self-pay | Admitting: Medical

## 2017-03-07 VITALS — BP 116/78 | HR 94 | Temp 98.2°F | Resp 16 | Ht 68.0 in | Wt 218.2 lb

## 2017-03-07 DIAGNOSIS — K529 Noninfective gastroenteritis and colitis, unspecified: Secondary | ICD-10-CM

## 2017-03-07 DIAGNOSIS — R197 Diarrhea, unspecified: Secondary | ICD-10-CM | POA: Diagnosis not present

## 2017-03-07 DIAGNOSIS — R109 Unspecified abdominal pain: Secondary | ICD-10-CM | POA: Diagnosis not present

## 2017-03-07 DIAGNOSIS — R630 Anorexia: Secondary | ICD-10-CM | POA: Diagnosis not present

## 2017-03-07 DIAGNOSIS — E860 Dehydration: Secondary | ICD-10-CM | POA: Diagnosis not present

## 2017-03-07 LAB — POCT CBC
GRANULOCYTE PERCENT: 59.1 % (ref 37–80)
HEMATOCRIT: 50 % — AB (ref 37.7–47.9)
Hemoglobin: 16.7 g/dL — AB (ref 12.2–16.2)
Lymph, poc: 2.3 (ref 0.6–3.4)
MCH: 29.9 pg (ref 27–31.2)
MCHC: 33.5 g/dL (ref 31.8–35.4)
MCV: 89.3 fL (ref 80–97)
MID (cbc): 0.7 (ref 0–0.9)
MPV: 7.7 fL (ref 0–99.8)
POC GRANULOCYTE: 4.3 (ref 2–6.9)
POC LYMPH PERCENT: 31.7 %L (ref 10–50)
POC MID %: 9.2 % (ref 0–12)
Platelet Count, POC: 314 10*3/uL (ref 142–424)
RBC: 5.6 M/uL — AB (ref 4.04–5.48)
RDW, POC: 12.4 %
WBC: 7.3 10*3/uL (ref 4.6–10.2)

## 2017-03-07 MED ORDER — SODIUM CHLORIDE 0.9 % IV SOLN: Freq: Once | INTRAVENOUS | Status: AC

## 2017-03-07 MED ORDER — ONDANSETRON 8 MG PO TBDP: 8.0000 mg | ORAL_TABLET | Freq: Three times a day (TID) | ORAL | 0 refills | Status: DC | PRN

## 2017-03-07 NOTE — Telephone Encounter (Signed)
Pt has had watery stools since Sunday, 7 per day until today,  3-4 today. Greenish in color. Reports mild dizziness, mid to upper back and chest "achy." Pt took B/P and HR during triage... HR 85, B/P 114/92.  No fever, no appetite. Reports "10 pound weight loss since Sunday." Taking very few fluids, urinating less frequently. Has taken [employer]ate "several times."  Also states visited a friend in hospital Sunday AM, symptoms began that evening. Appt made for today at 1720 with Mario Mani. Advised to increase po fluid intake, water or Gatorade; limit foods to rice, crackers, clear soups, bananas; any boiled starchy type foods. Monitor temp and urine output.  Advised to call back if symptoms worsen. Reason for Disposition . [1] MODERATE diarrhea (e.g., 4-6 times / day more than normal) AND [2] present > 48 hours (2 days)  Answer Assessment - Initial Assessment Questions 1. DIARRHEA SEVERITY: "How bad is the diarrhea?" "How many extra stools have you had in the past 24 hours than normal?"    - MILD: Few loose or mushy BMs; increase of 1-3 stools over normal daily number of stools; mild increase in ostomy output.   - MODERATE: Increase of 4-6 stools daily over normal; moderate increase in ostomy output.   - SEVERE (or Worst Possible): Increase of 7 or more stools daily over normal; moderate increase in ostomy output; incontinence.     7 or more 2. ONSET: "When did the diarrhea begin?"      23 rd 3. BM CONSISTENCY: "How loose or watery is the diarrhea?"      watery 4. VOMITING: "Are you also vomiting?" If so, ask: "How many times in the past 24 hours?"      Emesis last on Sunday night 12/23 5. ABDOMINAL PAIN: "Are you having any abdominal pain?" If yes: "What does it feel like?" (e.g., crampy, dull, intermittent, constant)      "Yesterday, pressure type pain." 6. ABDOMINAL PAIN SEVERITY: If present, ask: "How bad is the pain?"  (e.g., Scale 1-10; mild, moderate, or severe)    - MILD (1-3): doesn't  interfere with normal activities, abdomen soft and not tender to touch     - MODERATE (4-7): interferes with normal activities or awakens from sleep, tender to touch     - SEVERE (8-10): excruciating pain, doubled over, unable to do any normal activities       No pain today. 7. ORAL INTAKE: If vomiting, "Have you been able to drink liquids?" "How much fluids have you had in the past 24 hours?"      8. HYDRATION: "Any signs of dehydration?" (e.g., dry mouth [not just dry lips], too weak to stand, dizziness, new weight loss) "When did you last urinate?"     "slight dizziness" Lost 10 pounds last 2 days. 9. EXPOSURE: "Have you traveled to a foreign country recently?" "Have you been exposed to anyone with diarrhea?" "Could you have eaten any food that was spoiled?"     Husband had similar S/S 3 weeks ago. Unsure about food. Visited pt in hospital Sunday am. 10. OTHER SYMPTOMS: "Do you have any other symptoms?" (e.g., fever, blood in stool)      Mid to upper back and chest "achy."  Mild ache.  Protocols used: [facility]

## 2017-03-07 NOTE — Patient Instructions (Signed)
Diarrhea, Adult Diarrhea is when you have loose and water poop (stool) often. Diarrhea can make you feel weak and cause you to get dehydrated. Dehydration can make you tired and thirsty, make you have a dry mouth, and make it so you pee (urinate) less often. Diarrhea often lasts 2-3 days. However, it can last longer if it is a sign of something more serious. It is important to treat your diarrhea as told by your doctor. Follow these instructions at home: Eating and drinking  Follow these recommendations as told by your doctor:  Take an oral rehydration solution (ORS). This is a drink that is sold at pharmacies and stores.  Drink clear fluids, such as: ? Water. ? Ice chips. ? Diluted fruit juice. ? Low-calorie sports drinks.  Eat bland, easy-to-digest foods in small amounts as you are able. These foods include: ? Bananas. ? Applesauce. ? Rice. ? Low-fat (lean) meats. ? Toast. ? Crackers.  Avoid drinking fluids that have a lot of sugar or caffeine in them.  Avoid alcohol.  Avoid spicy or fatty foods.  General instructions   Drink enough fluid to keep your pee (urine) clear or pale yellow.  Wash your hands often. If you cannot use soap and water, use hand sanitizer.  Make sure that all people in your home wash their hands well and often.  Take over-the-counter and prescription medicines only as told by your doctor.  Rest at home while you get better.  Watch your condition for any changes.  Take a warm bath to help with any burning or pain from having diarrhea.  Keep all follow-up visits as told by your doctor. This is important. Contact a doctor if:  You have a fever.  Your diarrhea gets worse.  You have new symptoms.  You cannot keep fluids down.  You feel light-headed or dizzy.  You have a headache.  You have muscle cramps. Get help right away if:  You have chest pain.  You feel very weak or you pass out (faint).  You have bloody or black poop or  poop that look like tar.  You have very bad pain, cramping, or bloating in your belly (abdomen).  You have trouble breathing or you are breathing very quickly.  Your heart is beating very quickly.  Your skin feels cold and clammy.  You feel confused.  You have signs of dehydration, such as: ? Dark pee, hardly any pee, or no pee. ? Cracked lips. ? Dry mouth. ? Sunken eyes. ? Sleepiness. ? Weakness. This information is not intended to replace advice given to you by your health care provider. Make sure you discuss any questions you have with your health care provider. Document Released: 08/16/2007 Document Revised: 09/17/2015 Document Reviewed: 11/03/2014 Elsevier Interactive Patient Education  2018 [employer].     IF you received an x-ray today, you will receive an invoice from [facility]. Please contact [facility] at [phone] with questions or concerns regarding your invoice.   IF you received labwork today, you will receive an invoice from [facility]. Please contact [facility] at [phone] with questions or concerns regarding your invoice.   Our billing staff will not be able to assist you with questions regarding bills from these companies.  You will be contacted with the lab results as soon as they are available. The fastest way to get your results is to activate your My Chart account. Instructions are located on the last page of this paperwork. If you have not heard from  [place] regarding the results in 2 weeks, please contact this office.

## 2017-03-07 NOTE — Progress Notes (Signed)
   MRN: [id number] DOB: 01/20/1976  Subjective:   Lisa Ellis is a 41 y.o. female presenting for 3 day history of nausea and vomiting (1 episode), watery diarrhea, belly pain. Also has had intermittent dizziness. Has tried Kaopectate with minimal relief. She has tried to stay hydrated but has decreased appetite. Denies fever, bloody stools, hematuria, dysuria, rashes. Denies trying new foods, eating raw or uncooked meats, drinking unfiltered water, no travel outside the [place] in the past month. Has a history of cholecystectomy and is worried that this could be related to that.    Lisa Ellis has a current medication list which includes the following prescription(s): cyclobenzaprine, diclofenac, diclofenac, fluticasone, and hydrocodone-acetaminophen, and the following Facility-Administered Medications: sodium chloride. Also has No Known Allergies.  Lisa Ellis  has a past medical history of Allergy, Asthma, and Depression. Also  has a past surgical history that includes Cholecystectomy; Nasal sinus surgery; Breast surgery; and Augmentation mammaplasty. Her family history includes Diabetes in her father and paternal grandmother; Hypertension in her maternal grandfather and mother.   Objective:   Vitals: BP 116/78   Pulse 94   Temp 98.2 F (36.8 C) (Oral)   Resp 16   Ht 5\' 8"  (1.727 m)   Wt 218 lb 3.2 oz (99 kg)   SpO2 97%   BMI 33.18 kg/m   Physical Exam  Constitutional: She is oriented to person, place, and time. She appears well-developed and well-nourished.  HENT:  Mucous membranes dry.  Eyes: No scleral icterus.  Cardiovascular: Normal rate, regular rhythm and intact distal pulses. Exam reveals no gallop and no friction rub.  No murmur heard. Pulmonary/Chest: No respiratory distress. She has no wheezes. She has no rales.  Abdominal: Soft. Bowel sounds are normal. She exhibits no distension and no mass. There is tenderness (generalized over area depicted). There is no guarding.    Neurological:  She is alert and oriented to person, place, and time.  Skin: Skin is warm and dry.  Psychiatric: She has a normal mood and affect.   Results for orders placed or performed in visit on 03/07/17 (from the past 24 hour(s))  POCT CBC     Status: Abnormal   Collection Time: 03/07/17  5:44 PM  Result Value Ref Range   WBC 7.3 4.6 - 10.2 K/uL   Lymph, poc 2.3 0.6 - 3.4   POC LYMPH PERCENT 31.7 10 - 50 %L   MID (cbc) 0.7 0 - 0.9   POC MID % 9.2 0 - 12 %M   POC Granulocyte 4.3 2 - 6.9   Granulocyte percent 59.1 37 - 80 %G   RBC 5.60 (A) 4.04 - 5.48 M/uL   Hemoglobin 16.7 (A) 12.2 - 16.2 g/dL   HCT, POC 50.0 (A) 37.7 - 47.9 %   MCV 89.3 80 - 97 fL   MCH, POC 29.9 27 - 31.2 pg   MCHC 33.5 31.8 - 35.4 g/dL   RDW, POC 12.4 %   Platelet Count, POC 314 142 - 424 K/uL   MPV 7.7 0 - 99.8 fL   Assessment and Plan :   Gastroenteritis  Diarrhea, unspecified type - Plan: Stool culture, POCT CBC, 0.9 %  sodium chloride infusion  Dehydration - Plan: Comprehensive metabolic panel, 0.9 %  sodium chloride infusion  Decreased appetite  Belly pain   Will manage supportively, labs pending. Return-to-clinic precautions discussed, patient verbalized understanding.   Jaynee Eagles, PA-C [facility] [phone] 03/07/2017  5:24 PM

## 2017-03-08 LAB — COMPREHENSIVE METABOLIC PANEL
A/G RATIO: 1.8 (ref 1.2–2.2)
ALT: 26 IU/L (ref 0–32)
AST: 23 IU/L (ref 0–40)
Albumin: 5 g/dL (ref 3.5–5.5)
Alkaline Phosphatase: 89 IU/L (ref 39–117)
BILIRUBIN TOTAL: 0.4 mg/dL (ref 0.0–1.2)
BUN/Creatinine Ratio: 17 (ref 9–23)
BUN: 17 mg/dL (ref 6–24)
CALCIUM: 9.6 mg/dL (ref 8.7–10.2)
CO2: 21 mmol/L (ref 20–29)
Chloride: 101 mmol/L (ref 96–106)
Creatinine, Ser: 1.03 mg/dL — ABNORMAL HIGH (ref 0.57–1.00)
GFR calc Af Amer: 77 mL/min/{1.73_m2} (ref >59)
GFR, EST NON AFRICAN AMERICAN: 67 mL/min/{1.73_m2} (ref >59)
GLOBULIN, TOTAL: 2.8 g/dL (ref 1.5–4.5)
Glucose: 100 mg/dL — ABNORMAL HIGH (ref 65–99)
POTASSIUM: 3.4 mmol/L — AB (ref 3.5–5.2)
SODIUM: 140 mmol/L (ref 134–144)
Total Protein: 7.8 g/dL (ref 6.0–8.5)

## 2017-03-08 NOTE — Telephone Encounter (Signed)
See other telephone encounter.

## 2017-03-08 NOTE — Telephone Encounter (Signed)
See triage encounter from 12/26.

## 2017-03-13 LAB — STOOL CULTURE: E COLI SHIGA TOXIN ASSAY: NEGATIVE

## 2017-04-26 ENCOUNTER — Ambulatory Visit: Payer: BLUE CROSS/BLUE SHIELD | Admitting: Medical

## 2017-04-26 ENCOUNTER — Encounter: Payer: Self-pay | Admitting: Medical

## 2017-04-26 VITALS — BP 121/78 | HR 84 | Temp 98.2°F | Resp 16 | Ht 68.0 in | Wt 228.6 lb

## 2017-04-26 DIAGNOSIS — J309 Allergic rhinitis, unspecified: Secondary | ICD-10-CM

## 2017-04-26 DIAGNOSIS — J3489 Other specified disorders of nose and nasal sinuses: Secondary | ICD-10-CM | POA: Diagnosis not present

## 2017-04-26 DIAGNOSIS — R0981 Nasal congestion: Secondary | ICD-10-CM | POA: Diagnosis not present

## 2017-04-26 MED ORDER — AZELASTINE HCL 0.1 % NA SOLN: 2.0000 | Freq: Two times a day (BID) | NASAL | 12 refills | Status: DC

## 2017-04-26 MED ORDER — DOXYCYCLINE HYCLATE 100 MG PO TABS: 100.0000 mg | ORAL_TABLET | Freq: Two times a day (BID) | ORAL | 0 refills | Status: DC

## 2017-04-26 MED ORDER — FLUTICASONE PROPIONATE 50 MCG/ACT NA SUSP: 2.0000 | Freq: Every day | NASAL | 1 refills | Status: DC

## 2017-04-26 MED ORDER — BENZONATATE 100 MG PO CAPS: 100.0000 mg | ORAL_CAPSULE | Freq: Three times a day (TID) | ORAL | 0 refills | Status: DC | PRN

## 2017-04-26 NOTE — Patient Instructions (Signed)
Your recent nasal congestion, sinus pressure sneezing and itchy eyes probably are allergy related.  But you do have a history of some sinus infections in the past so we will watch you closely.  I am prescribing Astelin nasal spray and Flonase nasal spray.  You can try Astelin for a couple of days but if not getting adequate relief then you could add on Flonase.  For cough making benzonatate available.  As we approached the weekend if your sinus pressure worsens or you get chest congestion with productive cough/possible bronchitis then you can start the doxycycline print prescription I am making available.  Follow-up in 7-10 days or as needed.

## 2017-04-26 NOTE — Progress Notes (Signed)
Subjective:    Patient ID: Lisa Ellis, female    DOB: 03-01-1976, 42 y.o.   MRN: [id number]  HPI  Pt nasal congested and stuffy since Sunday. Pt states using mucinex but not improving. Her sinus are hurting more in maxillary sinus areas.  Pt trying to clear mucus from nose and having some difficulty. When she does blow will get some colored discharge. Some sneezing. Some itchy watery eyes.  Pt had sinus surgery in the summer. In the fall pt did not get sinus infection.   Review of Systems  Constitutional: Negative for chills, fatigue and fever.  HENT: Positive for congestion, sinus pressure, sinus pain and sneezing. Negative for ear pain, nosebleeds, postnasal drip, rhinorrhea, sore throat and tinnitus.   Eyes: Positive for itching.  Respiratory: Negative for cough, chest tightness, shortness of breath and wheezing.   Cardiovascular: Negative for chest pain and palpitations.  Gastrointestinal: Negative for abdominal pain, blood in stool, constipation, diarrhea and vomiting.  Musculoskeletal: Negative for back pain and gait problem.  Skin: Negative for rash.  Neurological: Negative for dizziness, speech difficulty, weakness, numbness and headaches.  Hematological: Negative for adenopathy. Does not bruise/bleed easily.  Psychiatric/Behavioral: Negative for behavioral problems and confusion.    Past Medical History:  Diagnosis Date  . Allergy   . Asthma    Only wheezed on time associated with pneumonia. none since.  . Depression    only had brief episode in 2006 for couple of months when dad passed away.     Social History   Socioeconomic History  . Marital status: Unknown    Spouse name: Not on file  . Number of children: Not on file  . Years of education: Not on file  . Highest education level: Not on file  Social Needs  . Financial resource strain: Not on file  . Food insecurity - worry: Not on file  . Food insecurity - inability: Not on file  . Transportation needs  - medical: Not on file  . Transportation needs - non-medical: Not on file  Occupational History  . Not on file  Tobacco Use  . Smoking status: Never Smoker  . Smokeless tobacco: Never Used  Substance and Sexual Activity  . Alcohol use: No    Alcohol/week: 0.0 oz  . Drug use: No  . Sexual activity: Yes  Other Topics Concern  . Not on file  Social History Narrative  . Not on file    Past Surgical History:  Procedure Laterality Date  . AUGMENTATION MAMMAPLASTY    . BREAST SURGERY     augmentation procedure.  . CHOLECYSTECTOMY    . NASAL SINUS SURGERY      Family History  Problem Relation Age of Onset  . Hypertension Mother   . Diabetes Father   . Hypertension Maternal Grandfather   . Diabetes Paternal Grandmother     No Known Allergies  No current outpatient medications on file prior to visit.   Current Facility-Administered Medications on File Prior to Visit  Medication Dose Route Frequency Provider Last Rate Last Dose  . 0.9 %  sodium chloride infusion   Intravenous Once Jaynee Eagles, PA-C        BP 121/78   Pulse 84   Temp 98.2 F (36.8 C) (Oral)   Resp 16   Ht 5\' 8"  (1.727 m)   Wt 228 lb 9.6 oz (103.7 kg)   SpO2 99%   BMI 34.76 kg/m       Objective:  Physical Exam  General  Mental Status - Alert. General Appearance - Well groomed. Not in acute distress.  Skin Rashes- No Rashes.  HEENT Head- Normal. Ear Auditory Canal - Left- Normal. Right - Normal.Tympanic Membrane- Left- Normal. Right- Normal. Eye Sclera/Conjunctiva- Left- Normal. Right- Normal. Nose & Sinuses Nasal Mucosa- Left-  Boggy and Congested. Right-  Boggy and  Congested.Bilateral  Faint maxillary but no  frontal sinus pressure. Mouth & Throat Lips: Upper Lip- Normal: no dryness, cracking, pallor, cyanosis, or vesicular eruption. Lower Lip-Normal: no dryness, cracking, pallor, cyanosis or vesicular eruption. Buccal Mucosa- Bilateral- No Aphthous ulcers. Oropharynx- No Discharge or  Erythema. Tonsils: Characteristics- Bilateral- No Erythema or Congestion. Size/Enlargement- Bilateral- No enlargement. Discharge- bilateral-None.  Neck Neck- Supple. No Masses.   Chest and Lung Exam Auscultation: Breath Sounds:-Clear even and unlabored.  Cardiovascular Auscultation:Rythm- Regular, rate and rhythm. Murmurs & Other Heart Sounds:Ausculatation of the heart reveal- No Murmurs.  Lymphatic Head & Neck General Head & Neck Lymphatics: Bilateral: Description- No Localized lymphadenopathy.      Assessment & Plan:  Your recent nasal congestion, sinus pressure sneezing and itchy eyes probably are allergy related.  But you do have a history of some sinus infections in the past so we will watch you closely.  I am prescribing Astelin nasal spray and Flonase nasal spray.  You can try Astelin for a couple of days but if not getting adequate relief then you could add on Flonase.  For cough making benzonatate available.  As we approached the weekend if your sinus pressure worsens or you get chest congestion with productive cough/possible bronchitis then you can start the doxycycline print prescription I am making available.  Follow-up in 7-10 days or as needed.  [name], PA-C

## 2017-05-21 ENCOUNTER — Ambulatory Visit: Payer: BLUE CROSS/BLUE SHIELD | Admitting: Medical

## 2017-05-21 ENCOUNTER — Encounter: Payer: Self-pay | Admitting: Medical

## 2017-05-21 VITALS — BP 122/78 | HR 68 | Temp 98.1°F | Resp 16 | Ht 68.0 in | Wt 234.6 lb

## 2017-05-21 DIAGNOSIS — M25522 Pain in left elbow: Secondary | ICD-10-CM | POA: Diagnosis not present

## 2017-05-21 DIAGNOSIS — M898X1 Other specified disorders of bone, shoulder: Secondary | ICD-10-CM

## 2017-05-21 DIAGNOSIS — M79632 Pain in left forearm: Secondary | ICD-10-CM | POA: Diagnosis not present

## 2017-05-21 DIAGNOSIS — R0789 Other chest pain: Secondary | ICD-10-CM | POA: Diagnosis not present

## 2017-05-21 DIAGNOSIS — R5383 Other fatigue: Secondary | ICD-10-CM

## 2017-05-21 MED ORDER — KETOROLAC TROMETHAMINE 60 MG/2ML IM SOLN
60.0000 mg | Freq: Once | INTRAMUSCULAR | Status: AC
  Administered 2017-05-21: 60 mg via INTRAMUSCULAR

## 2017-05-21 MED ORDER — MELOXICAM 15 MG PO TABS: 15.0000 mg | ORAL_TABLET | Freq: Every day | ORAL | 0 refills | Status: DC

## 2017-05-21 MED ORDER — CYCLOBENZAPRINE HCL 10 MG PO TABS: 10.0000 mg | ORAL_TABLET | Freq: Every day | ORAL | 0 refills | Status: DC

## 2017-05-21 NOTE — Progress Notes (Signed)
t

## 2017-05-21 NOTE — Patient Instructions (Addendum)
For your recent left elbow pain, forearm pain left scapular pain and atypical chest pain, we gave you Toradol 60 mg IM today.  Starting tomorrow can use meloxicam 15 mg daily and also make Flexeril to use at night.  Your chest wall pain mostly does appear to be costochondritis like.  Some pain reproducible on exam today.  However since she did have some moderate pain in elbow last night and you associated with some transient chest pain I do think it is best to get troponin lab today.  However if you have recurrent chest pain then would recommend ED evaluation.  Your EKG appears normal sinus rhythm but possible very minimal ST elevation 1 and aVL.(On review of prior EKG looks this looks very similar.)  No obvious acute changes seen.  For fatigue, we will get CBC, CMP, B1, B12 and vitamin D level.  Follow-up for your complete physical exam or as needed.  Presently I want to see how you respond to the anti-inflammatory medication we gave.  If by follow-up for your CPE you still has some intermittent pain in left arm with any association of chest discomfort then would refer you to cardiologist in our our building  upstairs.    Later found out that our lab could not send out troponin stat.  I have low suspicion/for cardiac concern.  If she is unable to send out troponin lab personnel did pass message to patient that if she has any recurrent chest pain later than be seen in the emergency department.  Patient expressed understanding.

## 2017-05-21 NOTE — Progress Notes (Signed)
Subjective:    Patient ID: Lisa Ellis, female    DOB: 12/11/1975, 42 y.o.   MRN: [id number]  HPI  Pt in with some left arm pain. Pain started yesterday afternoon. No injury or fall. Pain was worse in her left elbow. She also had some shoulder pain. Pain was low level at onset but then worse last night was in elbow. When presses her left forearm has some elbow pain. Today pain is less again.  Pt states she did have some faint mild chest pain and faint ache. She states This achiness is like can occur after cough. But then also associate elbow pain with chest discomfort.   Pt states now chest discomfort better than last night but notes if you press on area can induce pain.  However on review pt did have history of palpitation. I referred to cardiologist in 2016 but referral fell through. She mentions before I saw pt former pcp referred her to other clinic.   Pt states not sleeping well. States effecting her energy next day. So she is not working out recently.     Review of Systems  Constitutional: Positive for fatigue. Negative for chills and fever.  HENT: Negative for congestion, drooling, postnasal drip, rhinorrhea, sinus pressure and sinus pain.   Respiratory: Negative for cough, choking and wheezing.   Cardiovascular: Positive for chest pain. Negative for palpitations.       Atypical chest pain.  Gastrointestinal: Negative for abdominal pain, constipation, diarrhea, nausea and vomiting.  Genitourinary: Negative for dysuria and flank pain.  Musculoskeletal:       See hpi.  Skin: Negative for rash.  Neurological: Negative for dizziness, speech difficulty, weakness, numbness and headaches.  Psychiatric/Behavioral: Positive for sleep disturbance. Negative for agitation, behavioral problems, confusion and suicidal ideas. The patient is not nervous/anxious and is not hyperactive.        Objective:   Physical Exam  General Mental Status- Alert. General Appearance- Not in acute  distress.   Skin General: Color- Normal Color. Moisture- Normal Moisture.  Neck Carotid Arteries- Normal color. Moisture- Normal Moisture. No carotid bruits. No JVD. No pain on direct palpation of cervical spine.  Chest and Lung Exam Auscultation: Breath Sounds:-Normal.  Cardiovascular Auscultation:Rythm- Regular. Murmurs & Other Heart Sounds:Auscultation of the heart reveals- No Murmurs.  Abdomen Inspection:-Inspeection Normal. Palpation/Percussion:Note:No mass. Palpation and Percussion of the abdomen reveal- Non Tender, Non Distended + BS, no rebound or guarding.    Neurologic Cranial Nerve exam:- CN III-XII intact(No nystagmus), symmetric smile. Strength:- 5/5 equal and symmetric strength both upper and lower extremities.  Left shoulder- Good range of motion. No pain on palpation. Left elbow- Mild tenderness to palpation over elbow but not on palpation over lateral epicondyle. Left forearm- on palpation of proximal forearm mild tender to palpation.   Anterior thorax- mild tenderness to palpation of mid upper sterum.  Left scapula- mild tenderness to palpation.      Assessment & Plan:  For your recent left elbow pain, forearm pain left scapular pain and atypical chest pain, we gave you Toradol 60 mg IM today.  Starting tomorrow can use meloxicam 15 mg daily and also make Flexeril to use at night.  Your chest wall pain mostly does appear to be costochondritis like.  Some pain reproducible on exam today.  However since she did have some moderate pain in elbow last night and you associated with some transient chest pain I do think it is best to get troponin lab today.  However if  you have recurrent chest pain then would recommend ED evaluation.  Your EKG appears normal sinus rhythm but possible very minimal ST elevation 1 and aVL.(On review of prior EKG looks this looks very similar.)  No obvious acute changes seen.  For fatigue, we will get CBC, CMP, B1, B12 and vitamin D  level.  Follow-up for your complete physical exam or as needed.  Presently I want to see how you respond to the anti-inflammatory medication we gave.  If by follow-up for your CPE you still has some intermittent pain in left arm with any association of chest discomfort then would refer you to cardiologist in our building  upstairs.  Later found out that our lab could not send out troponin stat.  I have low suspicion/for cardiac concern.  If she is unable to send out troponin lab personnel did pass message to patient that if she has any recurrent chest pain later than be seen in the emergency department.  Patient expressed understanding.  [name], PA-C

## 2017-05-22 ENCOUNTER — Encounter: Payer: Self-pay | Admitting: Medical

## 2017-05-22 LAB — TSH: TSH: 1.29 u[IU]/mL (ref 0.35–4.50)

## 2017-05-22 LAB — VITAMIN B12: VITAMIN B 12: 389 pg/mL (ref 211–911)

## 2017-05-22 LAB — VITAMIN D 25 HYDROXY (VIT D DEFICIENCY, FRACTURES): VITD: 22.52 ng/mL — ABNORMAL LOW (ref 30.00–100.00)

## 2017-05-23 ENCOUNTER — Encounter: Payer: Self-pay | Admitting: Medical

## 2017-05-23 ENCOUNTER — Telehealth: Payer: Self-pay | Admitting: Medical

## 2017-05-23 DIAGNOSIS — R7989 Other specified abnormal findings of blood chemistry: Secondary | ICD-10-CM

## 2017-05-23 MED ORDER — VITAMIN D (ERGOCALCIFEROL) 1.25 MG (50000 UNIT) PO CAPS: 50000.0000 [IU] | ORAL_CAPSULE | ORAL | 0 refills | Status: DC

## 2017-05-23 NOTE — Telephone Encounter (Signed)
Rx vitamin D and placed future order.

## 2017-05-25 LAB — VITAMIN B1: VITAMIN B1 (THIAMINE): 14 nmol/L (ref 8–30)

## 2017-05-28 ENCOUNTER — Ambulatory Visit (INDEPENDENT_AMBULATORY_CARE_PROVIDER_SITE_OTHER): Payer: BLUE CROSS/BLUE SHIELD | Admitting: Medical

## 2017-05-28 ENCOUNTER — Encounter: Payer: Self-pay | Admitting: Medical

## 2017-05-28 VITALS — BP 116/70 | HR 73 | Resp 16 | Ht 68.0 in | Wt 237.6 lb

## 2017-05-28 DIAGNOSIS — R739 Hyperglycemia, unspecified: Secondary | ICD-10-CM

## 2017-05-28 DIAGNOSIS — Z0000 Encounter for general adult medical examination without abnormal findings: Secondary | ICD-10-CM

## 2017-05-28 LAB — CBC WITH DIFFERENTIAL/PLATELET
BASOS ABS: 0.1 10*3/uL (ref 0.0–0.1)
Basophils Relative: 1 % (ref 0.0–3.0)
EOS PCT: 3.8 % (ref 0.0–5.0)
Eosinophils Absolute: 0.3 10*3/uL (ref 0.0–0.7)
HCT: 43 % (ref 36.0–46.0)
HEMOGLOBIN: 14.7 g/dL (ref 12.0–15.0)
LYMPHS ABS: 3 10*3/uL (ref 0.7–4.0)
LYMPHS PCT: 34.7 % (ref 12.0–46.0)
MCHC: 34.1 g/dL (ref 30.0–36.0)
MCV: 90.7 fl (ref 78.0–100.0)
MONOS PCT: 6.3 % (ref 3.0–12.0)
Monocytes Absolute: 0.5 10*3/uL (ref 0.1–1.0)
NEUTROS PCT: 54.2 % (ref 43.0–77.0)
Neutro Abs: 4.7 10*3/uL (ref 1.4–7.7)
Platelets: 290 10*3/uL (ref 150.0–400.0)
RBC: 4.74 Mil/uL (ref 3.87–5.11)
RDW: 13 % (ref 11.5–15.5)
WBC: 8.6 10*3/uL (ref 4.0–10.5)

## 2017-05-28 LAB — COMPREHENSIVE METABOLIC PANEL
ALBUMIN: 4.2 g/dL (ref 3.5–5.2)
ALK PHOS: 69 U/L (ref 39–117)
ALT: 19 U/L (ref 0–35)
AST: 12 U/L (ref 0–37)
BILIRUBIN TOTAL: 0.5 mg/dL (ref 0.2–1.2)
BUN: 17 mg/dL (ref 6–23)
CO2: 28 mEq/L (ref 19–32)
Calcium: 9.8 mg/dL (ref 8.4–10.5)
Chloride: 105 mEq/L (ref 96–112)
Creatinine, Ser: 0.79 mg/dL (ref 0.40–1.20)
GFR: 84.37 mL/min (ref >60.00)
GLUCOSE: 103 mg/dL — AB (ref 70–99)
Potassium: 5.2 mEq/L — ABNORMAL HIGH (ref 3.5–5.1)
Sodium: 141 mEq/L (ref 135–145)
TOTAL PROTEIN: 7.4 g/dL (ref 6.0–8.3)

## 2017-05-28 LAB — URINALYSIS, ROUTINE W REFLEX MICROSCOPIC
BILIRUBIN URINE: NEGATIVE
HGB URINE DIPSTICK: NEGATIVE
Ketones, ur: NEGATIVE
LEUKOCYTES UA: NEGATIVE
NITRITE: NEGATIVE
RBC / HPF: NONE SEEN (ref 0–?)
Specific Gravity, Urine: 1.015 (ref 1.000–1.030)
TOTAL PROTEIN, URINE-UPE24: NEGATIVE
URINE GLUCOSE: NEGATIVE
Urobilinogen, UA: 0.2 (ref 0.0–1.0)
pH: 5.5 (ref 5.0–8.0)

## 2017-05-28 LAB — LIPID PANEL
CHOLESTEROL: 197 mg/dL (ref 0–200)
HDL: 46.9 mg/dL (ref >39.00)
LDL Cholesterol: 114 mg/dL — ABNORMAL HIGH (ref 0–99)
NONHDL: 150.05
Total CHOL/HDL Ratio: 4
Triglycerides: 179 mg/dL — ABNORMAL HIGH (ref 0.0–149.0)
VLDL: 35.8 mg/dL (ref 0.0–40.0)

## 2017-05-28 LAB — HEMOGLOBIN A1C: Hgb A1c MFr Bld: 5.6 % (ref 4.6–6.5)

## 2017-05-28 NOTE — Progress Notes (Signed)
Subjective:    Patient ID: Lisa Ellis, female    DOB: [birth year], 42 y.o.   MRN: [id number]  HPI  Pt is here for CPE. She is fasting.  No acute complaints today.  Pt is up to date on tdap.  Pt see gynecologist. She states up to date.] on papsmear.  Pt has not bee exercising since she started new office site.     Review of Systems  Constitutional: Negative for chills, fatigue and fever.       Expresses desire to loose weight.  HENT: Negative for congestion, ear discharge, facial swelling, hearing loss, sinus pressure, sinus pain, sneezing and sore throat.   Respiratory: Negative for cough, chest tightness, shortness of breath and wheezing.   Cardiovascular: Negative for chest pain and palpitations.  Gastrointestinal: Negative for abdominal distention, abdominal pain, blood in stool, constipation, diarrhea and vomiting.  Musculoskeletal:       Occasional upper back pain. Mentioned on last.   Skin: Negative for pallor and rash.  Neurological: Negative for dizziness, syncope, speech difficulty, weakness, light-headedness, numbness and headaches.  Hematological: Negative for adenopathy. Does not bruise/bleed easily.  Psychiatric/Behavioral: Negative for behavioral problems, confusion, self-injury and sleep disturbance.    Past Medical History:  Diagnosis Date  . Allergy   . Asthma    Only wheezed on time associated with pneumonia. none since.  . Depression    only had brief episode in 2006 for couple of months when dad passed away.     Social History   Socioeconomic History  . Marital status: Unknown    Spouse name: Not on file  . Number of children: Not on file  . Years of education: Not on file  . Highest education level: Not on file  Social Needs  . Financial resource strain: Not on file  . Food insecurity - worry: Not on file  . Food insecurity - inability: Not on file  . Transportation needs - medical: Not on file  . Transportation needs - non-medical: Not on  file  Occupational History  . Not on file  Tobacco Use  . Smoking status: Never Smoker  . Smokeless tobacco: Never Used  Substance and Sexual Activity  . Alcohol use: No    Alcohol/week: 0.0 oz  . Drug use: No  . Sexual activity: Yes  Other Topics Concern  . Not on file  Social History Narrative  . Not on file    Past Surgical History:  Procedure Laterality Date  . AUGMENTATION MAMMAPLASTY    . BREAST SURGERY     augmentation procedure.  . CHOLECYSTECTOMY    . NASAL SINUS SURGERY      Family History  Problem Relation Age of Onset  . Hypertension Mother   . Diabetes Father   . Hypertension Maternal Grandfather   . Diabetes Paternal Grandmother     No Known Allergies  Current Outpatient Medications on File Prior to Visit  Medication Sig Dispense Refill  . azelastine (ASTELIN) 0.1 % nasal spray Place 2 sprays into both nostrils 2 (two) times daily. Use in each nostril as directed 30 mL 12  . cyclobenzaprine (FLEXERIL) 10 MG tablet Take 1 tablet (10 mg total) by mouth at bedtime. 10 tablet 0  . fluticasone (FLONASE) 50 MCG/ACT nasal spray Place 2 sprays into both nostrils daily. 16 g 1  . meloxicam (MOBIC) 15 MG tablet Take 1 tablet (15 mg total) by mouth daily. 30 tablet 0  . Vitamin D, Ergocalciferol, (DRISDOL) 50000 units CAPS  capsule Take 1 capsule (50,000 Units total) by mouth every 7 (seven) days. 6 capsule 0   Current Facility-Administered Medications on File Prior to Visit  Medication Dose Route Frequency Provider Last Rate Last Dose  . 0.9 %  sodium chloride infusion   Intravenous Once Jaynee Eagles, PA-C        BP 116/70 (BP Location: Left Arm, Patient Position: Sitting, Cuff Size: Large)   Pulse 73   Resp 16   Ht 5\' 8"  (1.727 m)   Wt 237 lb 9.6 oz (107.8 kg)   SpO2 99%   BMI 36.13 kg/m       Objective:   Physical Exam  General Mental Status- Alert. General Appearance- Not in acute distress.   Skin General: Color- Normal Color. Moisture- Normal  Moisture.  Neck Carotid Arteries- Normal color. Moisture- Normal Moisture. No carotid bruits. No JVD.  Chest and Lung Exam Auscultation: Breath Sounds:-Normal.  Cardiovascular Auscultation:Rythm- Regular. Murmurs & Other Heart Sounds:Auscultation of the heart reveals- No Murmurs.  Abdomen Inspection:-Inspeection Normal. Palpation/Percussion:Note:No mass. Palpation and Percussion of the abdomen reveal- Non Tender, Non Distended + BS, no rebound or guarding.    Neurologic Cranial Nerve exam:- CN III-XII intact(No nystagmus), symmetric smile. Strength:- 5/5 equal and symmetric strength both upper and lower extremities.      Assessment & Plan:  For you wellness exam today I have ordered cbc, cmp, lipid panel, and UA.  Vaccine up to date.  Recommend exercise and healthy diet.  We will let you know lab results as they come in.  Follow up date appointment will be determined after lab review.   [name], PA-C

## 2017-05-28 NOTE — Patient Instructions (Addendum)
For you wellness exam today I have ordered cbc, cmp, lipid panel, and UA.  Vaccine up to date.  Recommend exercise and healthy diet.  We will let you know lab results as they come in.  Follow up date appointment will be determined after lab review.    Preventive Care 18-39 Years, Female Preventive care refers to lifestyle choices and visits with your health care provider that can promote health and wellness. What does preventive care include?  A yearly physical exam. This is also called an annual well check.  Dental exams once or twice a year.  Routine eye exams. Ask your health care provider how often you should have your eyes checked.  Personal lifestyle choices, including: ? Daily care of your teeth and gums. ? Regular physical activity. ? Eating a healthy diet. ? Avoiding tobacco and drug use. ? Limiting alcohol use. ? Practicing safe sex. ? Taking vitamin and mineral supplements as recommended by your health care provider. What happens during an annual well check? The services and screenings done by your health care provider during your annual well check will depend on your age, overall health, lifestyle risk factors, and family history of disease. Counseling Your health care provider may ask you questions about your:  Alcohol use.  Tobacco use.  Drug use.  Emotional well-being.  Home and relationship well-being.  Sexual activity.  Eating habits.  Work and work Statistician.  Method of birth control.  Menstrual cycle.  Pregnancy history.  Screening You may have the following tests or measurements:  Height, weight, and BMI.  Diabetes screening. This is done by checking your blood sugar (glucose) after you have not eaten for a while (fasting).  Blood pressure.  Lipid and cholesterol levels. These may be checked every 5 years starting at age 62.  Skin check.  Hepatitis C blood test.  Hepatitis B blood test.  Sexually transmitted disease (STD)  testing.  BRCA-related cancer screening. This may be done if you have a family history of breast, ovarian, tubal, or peritoneal cancers.  Pelvic exam and Pap test. This may be done every 3 years starting at age 43. Starting at age 71, this may be done every 5 years if you have a Pap test in combination with an HPV test.  Discuss your test results, treatment options, and if necessary, the need for more tests with your health care provider. Vaccines Your health care provider may recommend certain vaccines, such as:  Influenza vaccine. This is recommended every year.  Tetanus, diphtheria, and acellular pertussis (Tdap, Td) vaccine. You may need a Td booster every 10 years.  Varicella vaccine. You may need this if you have not been vaccinated.  HPV vaccine. If you are 2 or younger, you may need three doses over 6 months.  Measles, mumps, and rubella (MMR) vaccine. You may need at least one dose of MMR. You may also need a second dose.  Pneumococcal 13-valent conjugate (PCV13) vaccine. You may need this if you have certain conditions and were not previously vaccinated.  Pneumococcal polysaccharide (PPSV23) vaccine. You may need one or two doses if you smoke cigarettes or if you have certain conditions.  Meningococcal vaccine. One dose is recommended if you are age 52-21 years and a first-year college student living in a residence hall, or if you have one of several medical conditions. You may also need additional booster doses.  Hepatitis A vaccine. You may need this if you have certain conditions or if you travel or work  in places where you may be exposed to hepatitis A.  Hepatitis B vaccine. You may need this if you have certain conditions or if you travel or work in places where you may be exposed to hepatitis B.  Haemophilus influenzae type b (Hib) vaccine. You may need this if you have certain risk factors.  Talk to your health care provider about which screenings and vaccines you  need and how often you need them. This information is not intended to replace advice given to you by your health care provider. Make sure you discuss any questions you have with your health care provider. Document Released: 04/25/2001 Document Revised: 11/17/2015 Document Reviewed: 12/29/2014 Elsevier Interactive Patient Education  [employer].

## 2017-05-30 ENCOUNTER — Encounter: Payer: Self-pay | Admitting: Medical

## 2017-06-05 ENCOUNTER — Encounter: Payer: Self-pay | Admitting: Medical

## 2017-06-05 ENCOUNTER — Ambulatory Visit: Payer: BLUE CROSS/BLUE SHIELD | Admitting: Medical

## 2017-06-05 ENCOUNTER — Ambulatory Visit (HOSPITAL_BASED_OUTPATIENT_CLINIC_OR_DEPARTMENT_OTHER)
Admission: RE | Admit: 2017-06-05 | Discharge: 2017-06-05 | Disposition: A | Discharge status: Home / Self Care | Payer: BLUE CROSS/BLUE SHIELD | Source: Ambulatory Visit | Attending: Medical | Admitting: Medical

## 2017-06-05 VITALS — BP 123/81 | HR 79 | Temp 98.2°F | Resp 16 | Ht 68.0 in | Wt 235.8 lb

## 2017-06-05 DIAGNOSIS — R0781 Pleurodynia: Secondary | ICD-10-CM | POA: Insufficient documentation

## 2017-06-05 DIAGNOSIS — E669 Obesity, unspecified: Secondary | ICD-10-CM | POA: Diagnosis not present

## 2017-06-05 DIAGNOSIS — M940 Chondrocostal junction syndrome [Tietze]: Secondary | ICD-10-CM

## 2017-06-05 DIAGNOSIS — M79632 Pain in left forearm: Secondary | ICD-10-CM | POA: Diagnosis not present

## 2017-06-05 DIAGNOSIS — M25522 Pain in left elbow: Secondary | ICD-10-CM | POA: Diagnosis not present

## 2017-06-05 MED ORDER — PHENTERMINE HCL 15 MG PO CAPS: 15.0000 mg | ORAL_CAPSULE | ORAL | 0 refills | Status: DC

## 2017-06-05 NOTE — Progress Notes (Signed)
Subjective:    Patient ID: Lisa Ellis, female    DOB: 30-Aug-1975, 42 y.o.   MRN: [id number]  HPI  Pt in to talk about weight loss.   She is starting to exercise. Just walking presently. Walking her dog 2 mile 4-5 days a week.  Pt bmi 36.14.   Last 2 years gradual weight gain.  Pt does have some chronic back pain in past with some radiating pain to left side. Currently this pain not presently. Has come and gone. In past referred to PT and pain eased up and then resolved for a while. Occaiosnal intermittent chest pain atypical associate with that on past. Now just direct tender to left constochondral junciton on papaton. No associated cardiac sign or symptoms. Pt 3 years ago for back and atypical chest pain had stress test, holter and echo. That work up was negative per pt.  Pt states holter monitor in past showed rare occaional described problable pvc but not symptomatic.  LMP- March 10 , 2019.    Review of Systems  Constitutional: Negative for chills, fatigue and fever.  Respiratory: Negative for chest tightness, shortness of breath and wheezing.   Cardiovascular: Negative for chest pain and palpitations.  Gastrointestinal: Negative for abdominal pain, constipation, diarrhea and vomiting.  Musculoskeletal: Negative for back pain, gait problem, neck pain and neck stiffness.  Skin: Negative for rash.  Neurological: Negative for dizziness, speech difficulty, weakness and light-headedness.  Hematological: Negative for adenopathy. Does not bruise/bleed easily.  Psychiatric/Behavioral: Negative for behavioral problems, dysphoric mood and suicidal ideas. The patient is not nervous/anxious.     Past Medical History:  Diagnosis Date  . Allergy   . Asthma    Only wheezed on time associated with pneumonia. none since.  . Depression    only had brief episode in 2006 for couple of months when dad passed away.     Social History   Socioeconomic History  . Marital status: Unknown    Spouse name: Not on file  . Number of children: Not on file  . Years of education: Not on file  . Highest education level: Not on file  Occupational History  . Not on file  Social Needs  . Financial resource strain: Not on file  . Food insecurity:    Worry: Not on file    Inability: Not on file  . Transportation needs:    Medical: Not on file    Non-medical: Not on file  Tobacco Use  . Smoking status: Never Smoker  . Smokeless tobacco: Never Used  Substance and Sexual Activity  . Alcohol use: No    Alcohol/week: 0.0 oz  . Drug use: No  . Sexual activity: Yes  Lifestyle  . Physical activity:    Days per week: Not on file    Minutes per session: Not on file  . Stress: Not on file  Relationships  . Social connections:    Talks on phone: Not on file    Gets together: Not on file    Attends religious service: Not on file    Active member of club or organization: Not on file    Attends meetings of clubs or organizations: Not on file    Relationship status: Not on file  . Intimate partner violence:    Fear of current or ex partner: Not on file    Emotionally abused: Not on file    Physically abused: Not on file    Forced sexual activity: Not on file  Other Topics Concern  . Not on file  Social History Narrative  . Not on file    Past Surgical History:  Procedure Laterality Date  . AUGMENTATION MAMMAPLASTY    . BREAST SURGERY     augmentation procedure.  . CHOLECYSTECTOMY    . NASAL SINUS SURGERY      Family History  Problem Relation Age of Onset  . Hypertension Mother   . Diabetes Father   . Hypertension Maternal Grandfather   . Diabetes Paternal Grandmother     No Known Allergies  Current Outpatient Medications on File Prior to Visit  Medication Sig Dispense Refill  . azelastine (ASTELIN) 0.1 % nasal spray Place 2 sprays into both nostrils 2 (two) times daily. Use in each nostril as directed 30 mL 12  . cyclobenzaprine (FLEXERIL) 10 MG tablet Take 1  tablet (10 mg total) by mouth at bedtime. 10 tablet 0  . fluticasone (FLONASE) 50 MCG/ACT nasal spray Place 2 sprays into both nostrils daily. 16 g 1  . meloxicam (MOBIC) 15 MG tablet Take 1 tablet (15 mg total) by mouth daily. 30 tablet 0  . Vitamin D, Ergocalciferol, (DRISDOL) 50000 units CAPS capsule Take 1 capsule (50,000 Units total) by mouth every 7 (seven) days. 6 capsule 0   Current Facility-Administered Medications on File Prior to Visit  Medication Dose Route Frequency Provider Last Rate Last Dose  . 0.9 %  sodium chloride infusion   Intravenous Once Jaynee Eagles, PA-C        BP 123/81   Pulse 79   Temp 98.2 F (36.8 C) (Oral)   Resp 16   Ht 5\' 8"  (1.727 m)   Wt 235 lb 12.8 oz (107 kg)   SpO2 99%   BMI 35.85 kg/m       Objective:   Physical Exam  General Mental Status- Alert. General Appearance- Not in acute distress.   Skin General: Color- Normal Color. Moisture- Normal Moisture.  Neck Carotid Arteries- Normal color. Moisture- Normal Moisture. No carotid bruits. No JVD.  Chest and Lung Exam Auscultation: Breath Sounds:-Normal.  Cardiovascular Auscultation:Rythm- Regular. Murmurs & Other Heart Sounds:Auscultation of the heart reveals- No Murmurs.  Abdomen Inspection:-Inspeection Normal. Palpation/Percussion:Note:No mass. Palpation and Percussion of the abdomen reveal- Non Tender, Non Distended + BS, no rebound or guarding.   Neurologic Cranial Nerve exam:- CN III-XII intact(No nystagmus), symmetric smile. Strength:- 5/5 equal and symmetric strength both upper and lower extremities.  Anterior thorax- mild tenderness direct over left costochondral junction. Small region. Left       Assessment & Plan:  For obesity, I do recommend diet and exercise. Will add low dose phentermine and follow weight loss by one month. If any adverse side effects notify us and stop medication. Will check bp and pulse in one month. Assess weight loss.  For occasional  costochondritis type pain and rib pain take diclofenac. Prior cardiac work up appeared negative. If you have changing signs or symptoms may refer you back to cardiologist. Up stairs with Dr Bettina Gavia or associate.  Please get cxr and rib xray today.  Follow up in one month or as needed  [employer], PA-C

## 2017-06-05 NOTE — Patient Instructions (Signed)
For obesity, I do recommend diet and exercise. Will add low dose phentermine and follow weight loss by one month. If any adverse side effects notify us and stop medication. Will check bp and pulse in one month. Assess weight loss.  For occasional costochondritis type pain and rib pain take diclofenac. Prior cardiac work up appeared negative. If you have changing signs or symptoms may refer you back to cardiologist. Up stairs with Dr Bettina Gavia or associate.  Please get cxr and rib xray today.  Follow up in one month or as needed

## 2017-06-07 ENCOUNTER — Telehealth: Payer: Self-pay | Admitting: Medical

## 2017-06-07 DIAGNOSIS — R0789 Other chest pain: Secondary | ICD-10-CM

## 2017-06-07 NOTE — Telephone Encounter (Signed)
Referral to cardiologist

## 2017-06-15 ENCOUNTER — Ambulatory Visit: Payer: BLUE CROSS/BLUE SHIELD | Admitting: Cardiology

## 2017-06-15 ENCOUNTER — Encounter: Payer: Self-pay | Admitting: Cardiology

## 2017-06-15 VITALS — BP 106/64 | HR 96 | Ht 67.0 in | Wt 232.0 lb

## 2017-06-15 DIAGNOSIS — R0789 Other chest pain: Secondary | ICD-10-CM | POA: Diagnosis not present

## 2017-06-15 DIAGNOSIS — E785 Hyperlipidemia, unspecified: Secondary | ICD-10-CM

## 2017-06-15 DIAGNOSIS — M940 Chondrocostal junction syndrome [Tietze]: Secondary | ICD-10-CM | POA: Diagnosis not present

## 2017-06-15 HISTORY — DX: Hyperlipidemia, unspecified: E78.5

## 2017-06-15 MED ORDER — ASPIRIN EC 81 MG PO TBEC: 81.0000 mg | DELAYED_RELEASE_TABLET | Freq: Every day | ORAL | 3 refills | Status: DC

## 2017-06-15 NOTE — Addendum Note (Signed)
Addended by: Austin Miles on: 06/15/2017 10:08 AM   Modules accepted: Orders

## 2017-06-15 NOTE — Patient Instructions (Signed)
Medication Instructions:  Your physician has recommended you make the following change in your medication:  START aspirin 81 mg daily  Labwork: None  Testing/Procedures: You had an EKG today.   Your physician has requested that you have a stress echocardiogram. For further information please visit [email or web address]. Please follow instruction sheet as given.   Follow-Up: Your physician recommends that you schedule a follow-up appointment in: 1 month.  Any Other Special Instructions Will Be Listed Below (If Applicable).     If you need a refill on your cardiac medications before your next appointment, please call your pharmacy.

## 2017-06-15 NOTE — Progress Notes (Signed)
Cardiology Consultation:    Date:  06/15/2017   ID:  Lisa Ellis, DOB 08-Mar-1976, MRN [id number]  PCP:  [name], PA-C  Cardiologist:  Jenne Campus, MD   Referring MD: Elise Benne   Chief Complaint  Patient presents with  . Chest Pain    Started in the back next to the left shoulder blade, tenderness in the sternum moving to the upper left chest, pain moves around  I have a chest pain  History of Present Illness:    Lisa Ellis is a 42 y.o. female who is being seen today for the evaluation of chest pain at the request of S[place]ier, Percell Miller, [place].  For last few months she is being experiencing chest pain.  Initially actually she started having pain around the left scapular area.  That was reproducible by pressing chest wall.  However now pain started spreading towards the chest.  It can happen anytime is not involved by exercise.  Turning her shoulder can make mild difference in severity of the pain overall she agreed to sensation 6 in scale up to 10.  There is no shortness of breath no sweating associated with this sensation.  She does not exercise on the regular basis.  Used to go to gym however a few years ago he stopped.  Duration of the pain can be only seconds up to a few minutes. Risk factors for coronary artery disease include family history of premature coronary disease and mild hyperlipidemia.  Past Medical History:  Diagnosis Date  . Allergy   . Asthma    Only wheezed on time associated with pneumonia. none since.  . Depression    only had brief episode in 2006 for couple of months when dad passed away.    Past Surgical History:  Procedure Laterality Date  . AUGMENTATION MAMMAPLASTY    . BREAST SURGERY     augmentation procedure.  . CHOLECYSTECTOMY    . NASAL SINUS SURGERY      Current Medications: Current Meds  Medication Sig  . azelastine (ASTELIN) 0.1 % nasal spray Place 2 sprays into both nostrils 2 (two) times daily. Use in each nostril as  directed  . fluticasone (FLONASE) 50 MCG/ACT nasal spray Place 2 sprays into both nostrils daily.  . phentermine 15 MG capsule Take 1 capsule (15 mg total) by mouth every morning.  . Vitamin D, Ergocalciferol, (DRISDOL) 50000 units CAPS capsule Take 1 capsule (50,000 Units total) by mouth every 7 (seven) days.   Current Facility-Administered Medications for the 06/15/17 encounter (Office Visit) with Park Liter, MD  Medication  . 0.9 %  sodium chloride infusion     Allergies:   Patient has no known allergies.   Social History   Socioeconomic History  . Marital status: Unknown    Spouse name: Not on file  . Number of children: Not on file  . Years of education: Not on file  . Highest education level: Not on file  Occupational History  . Not on file  Social Needs  . Financial resource strain: Not on file  . Food insecurity:    Worry: Not on file    Inability: Not on file  . Transportation needs:    Medical: Not on file    Non-medical: Not on file  Tobacco Use  . Smoking status: Never Smoker  . Smokeless tobacco: Never Used  Substance and Sexual Activity  . Alcohol use: No    Alcohol/week: 0.0 oz  . Drug use: No  .  Sexual activity: Yes  Lifestyle  . Physical activity:    Days per week: Not on file    Minutes per session: Not on file  . Stress: Not on file  Relationships  . Social connections:    Talks on phone: Not on file    Gets together: Not on file    Attends religious service: Not on file    Active member of club or organization: Not on file    Attends meetings of clubs or organizations: Not on file    Relationship status: Not on file  Other Topics Concern  . Not on file  Social History Narrative  . Not on file     Family History: The patient's family history includes Diabetes in her father and paternal grandmother; Hypertension in her maternal grandfather and mother. ROS:   Please see the history of present illness.    All 14 point review of  systems negative except as described per history of present illness.  EKGs/Labs/Other Studies Reviewed:    The following studies were reviewed today:   EKG:  EKG is  ordered today.  The ekg ordered today demonstrates EKG showed normal sinus rhythm normal P interval normal QS complex duration morphology no ST segment changes  Recent Labs: 05/21/2017: TSH 1.29 05/28/2017: ALT 19; BUN 17; Creatinine, Ser 0.79; Hemoglobin 14.7; Platelets 290.0; Potassium 5.2; Sodium 141  Recent Lipid Panel    Component Value Date/Time   CHOL 197 05/28/2017 0855   TRIG 179.0 (H) 05/28/2017 0855   HDL 46.90 05/28/2017 0855   CHOLHDL 4 05/28/2017 0855   VLDL 35.8 05/28/2017 0855   LDLCALC 114 (H) 05/28/2017 0855    Physical Exam:    VS:  BP 106/64   Pulse 96   Ht 5\' 7"  (1.702 m)   Wt 232 lb (105.2 kg)   SpO2 98%   BMI 36.34 kg/m     Wt Readings from Last 3 Encounters:  06/15/17 232 lb (105.2 kg)  06/05/17 235 lb 12.8 oz (107 kg)  05/28/17 237 lb 9.6 oz (107.8 kg)     GEN:  Well nourished, well developed in no acute distress HEENT: Normal NECK: No JVD; No carotid bruits LYMPHATICS: No lymphadenopathy CARDIAC: RRR, no murmurs, no rubs, no gallops RESPIRATORY:  Clear to auscultation without rales, wheezing or rhonchi  ABDOMEN: Soft, non-tender, non-distended MUSCULOSKELETAL:  No edema; No deformity  SKIN: Warm and dry NEUROLOGIC:  Alert and oriented x 3 PSYCHIATRIC:  Normal affect   ASSESSMENT:    1. Atypical chest pain   2. Costochondritis   3. Dyslipidemia    PLAN:    In order of problems listed above:  1. Atypical chest pain.  Not related to exercise lasting between split-second after a few minutes.  Sharp like.  However she is very concerned about this therefore I think it would be appropriate to perform stress test on her we will schedule her to have a stress echocardiogram.  Until then I asked her to start taking one baby aspirin every single day.  I will meet with her again in  about a month to begin to talk about stress test and then will talk more about risk factors modifications.  I have a low level of suspicion that she does have significant heart problem. 2. History of costochondritis treated with nonsteroidal anti-inflammatory medication however not much relief from that. 3. Dyslipidemia: Her LDL is 114.  We will not initiate any therapy until he got stress test done. 4. I  have low level suspicion that her pain is related to her heart.  However in the future we may explore possibility of fibromyalgia or some systemic disease.   Medication Adjustments/Labs and Tests Ordered: Current medicines are reviewed at length with the patient today.  Concerns regarding medicines are outlined above.  No orders of the defined types were placed in this encounter.  No orders of the defined types were placed in this encounter.   Signed, Park Liter, MD, [facility]. 06/15/2017 9:56 AM    [place]

## 2017-06-30 ENCOUNTER — Other Ambulatory Visit: Payer: Self-pay | Admitting: Medical

## 2017-07-10 ENCOUNTER — Ambulatory Visit (HOSPITAL_BASED_OUTPATIENT_CLINIC_OR_DEPARTMENT_OTHER)
Admission: RE | Admit: 2017-07-10 | Discharge: 2017-07-10 | Disposition: A | Discharge status: Home / Self Care | Payer: BLUE CROSS/BLUE SHIELD | Source: Ambulatory Visit | Attending: Cardiology | Admitting: Cardiology

## 2017-07-10 DIAGNOSIS — R0789 Other chest pain: Secondary | ICD-10-CM | POA: Diagnosis not present

## 2017-07-10 NOTE — Progress Notes (Signed)
  Echocardiogram Echocardiogram Stress Test has been performed.  Joelene Millin 07/10/2017, 12:25 PM

## 2017-07-16 ENCOUNTER — Ambulatory Visit: Payer: BLUE CROSS/BLUE SHIELD | Admitting: Cardiology

## 2017-07-16 VITALS — BP 130/60 | HR 82 | Ht 67.0 in | Wt 232.4 lb

## 2017-07-16 DIAGNOSIS — R0789 Other chest pain: Secondary | ICD-10-CM

## 2017-07-16 DIAGNOSIS — E785 Hyperlipidemia, unspecified: Secondary | ICD-10-CM | POA: Diagnosis not present

## 2017-07-16 DIAGNOSIS — M549 Dorsalgia, unspecified: Secondary | ICD-10-CM | POA: Diagnosis not present

## 2017-07-16 NOTE — Progress Notes (Signed)
Cardiology Office Note:    Date:  07/16/2017   ID:  Lisa Ellis, DOB 1975/06/29, MRN [id number]  PCP:  [name], PA-C  Cardiologist:  Jenne Campus, MD    Referring MD: Elise Benne   Chief Complaint  Patient presents with  . 1 month follow up  Still having pain in the back  History of Present Illness:    Lisa Ellis is a 42 y.o. female who was referred to [place] because of atypical chest pain.  Pain is located on the back with some radiation towards the arm there is some pinpoint location by pressing of which she will have more pain.  She did have a stress that she did well on the treadmill she did not have any chest pain tightness squeezing pressure burning chest while walking on the treadmill.  Overall had a very low level of suspicion for her pain being related to her heart.  We prove it with negative stress test.  We spent Gradle of time talking about risk factors modification for heart problem.  In my opinion she may require an MRI of her spine especially cervical spine on top of that may be rheumatological work-up need to be done.  Past Medical History:  Diagnosis Date  . Allergy   . Asthma    Only wheezed on time associated with pneumonia. none since.  . Depression    only had brief episode in 2006 for couple of months when dad passed away.    Past Surgical History:  Procedure Laterality Date  . AUGMENTATION MAMMAPLASTY    . BREAST SURGERY     augmentation procedure.  . CHOLECYSTECTOMY    . NASAL SINUS SURGERY      Current Medications: Current Meds  Medication Sig  . aspirin EC 81 MG tablet Take 1 tablet (81 mg total) by mouth daily.  Marland Kitchen azelastine (ASTELIN) 0.1 % nasal spray Place 2 sprays into both nostrils 2 (two) times daily. Use in each nostril as directed  . fluticasone (FLONASE) 50 MCG/ACT nasal spray Place 2 sprays into both nostrils daily.   Current Facility-Administered Medications for the 07/16/17 encounter (Office Visit) with Park Liter, MD  Medication  . 0.9 %  sodium chloride infusion     Allergies:   Patient has no known allergies.   Social History   Socioeconomic History  . Marital status: Married    Spouse name: Not on file  . Number of children: Not on file  . Years of education: Not on file  . Highest education level: Not on file  Occupational History  . Not on file  Social Needs  . Financial resource strain: Not on file  . Food insecurity:    Worry: Not on file    Inability: Not on file  . Transportation needs:    Medical: Not on file    Non-medical: Not on file  Tobacco Use  . Smoking status: Never Smoker  . Smokeless tobacco: Never Used  Substance and Sexual Activity  . Alcohol use: No    Alcohol/week: 0.0 oz  . Drug use: No  . Sexual activity: Yes  Lifestyle  . Physical activity:    Days per week: Not on file    Minutes per session: Not on file  . Stress: Not on file  Relationships  . Social connections:    Talks on phone: Not on file    Gets together: Not on file    Attends religious service: Not on file  Active member of club or organization: Not on file    Attends meetings of clubs or organizations: Not on file    Relationship status: Not on file  Other Topics Concern  . Not on file  Social History Narrative  . Not on file     Family History: The patient's family history includes Diabetes in her father and paternal grandmother; Hypertension in her maternal grandfather and mother. ROS:   Please see the history of present illness.    All 14 point review of systems negative except as described per history of present illness  EKGs/Labs/Other Studies Reviewed:      Recent Labs: 05/21/2017: TSH 1.29 05/28/2017: ALT 19; BUN 17; Creatinine, Ser 0.79; Hemoglobin 14.7; Platelets 290.0; Potassium 5.2; Sodium 141  Recent Lipid Panel    Component Value Date/Time   CHOL 197 05/28/2017 0855   TRIG 179.0 (H) 05/28/2017 0855   HDL 46.90 05/28/2017 0855   CHOLHDL 4  05/28/2017 0855   VLDL 35.8 05/28/2017 0855   LDLCALC 114 (H) 05/28/2017 0855    Physical Exam:    VS:  BP 130/60   Pulse 82   Ht 5\' 7"  (1.702 m)   Wt 232 lb 6.4 oz (105.4 kg)   SpO2 98%   BMI 36.40 kg/m     Wt Readings from Last 3 Encounters:  07/16/17 232 lb 6.4 oz (105.4 kg)  06/15/17 232 lb (105.2 kg)  06/05/17 235 lb 12.8 oz (107 kg)     GEN:  Well nourished, well developed in no acute distress HEENT: Normal NECK: No JVD; No carotid bruits LYMPHATICS: No lymphadenopathy CARDIAC: RRR, no murmurs, no rubs, no gallops RESPIRATORY:  Clear to auscultation without rales, wheezing or rhonchi  ABDOMEN: Soft, non-tender, non-distended MUSCULOSKELETAL:  No edema; No deformity  SKIN: Warm and dry LOWER EXTREMITIES: no swelling NEUROLOGIC:  Alert and oriented x 3 PSYCHIATRIC:  Normal affect   ASSESSMENT:    1. Atypical chest pain   2. Dyslipidemia   3. Upper back pain on left side    PLAN:    In order of problems listed above:  1. Atypical chest pain: Not related to her heart.  Stress test negative. 2. Dyslipidemia did not reach the point of initiation of any therapy.  We will continue monitoring we talked about diet and exercises which she will try to do if her back will get better 3. Upper back pain.  Will refer back to primary care physician.  May be MRI of the spine and rheumatological work-up need to be done.  I see her back in my office in 3 months to see how she does.   Medication Adjustments/Labs and Tests Ordered: Current medicines are reviewed at length with the patient today.  Concerns regarding medicines are outlined above.  No orders of the defined types were placed in this encounter.  Medication changes: No orders of the defined types were placed in this encounter.   Signed, Park Liter, MD, [facility] 07/16/2017 9:59 AM    [place]

## 2017-07-16 NOTE — Patient Instructions (Signed)

## 2017-07-22 ENCOUNTER — Encounter: Payer: Self-pay | Admitting: Medical

## 2017-10-03 ENCOUNTER — Encounter: Payer: Self-pay | Admitting: Medical

## 2017-10-03 ENCOUNTER — Ambulatory Visit: Payer: BLUE CROSS/BLUE SHIELD | Admitting: Medical

## 2017-10-03 VITALS — BP 126/71 | HR 76 | Temp 97.8°F | Resp 16 | Ht 67.0 in | Wt 237.0 lb

## 2017-10-03 DIAGNOSIS — M542 Cervicalgia: Secondary | ICD-10-CM

## 2017-10-03 DIAGNOSIS — S46812D Strain of other muscles, fascia and tendons at shoulder and upper arm level, left arm, subsequent encounter: Secondary | ICD-10-CM

## 2017-10-03 DIAGNOSIS — M7712 Lateral epicondylitis, left elbow: Secondary | ICD-10-CM

## 2017-10-03 MED ORDER — MELOXICAM 7.5 MG PO TABS: ORAL_TABLET | ORAL | 0 refills | Status: DC

## 2017-10-03 MED ORDER — CYCLOBENZAPRINE HCL 5 MG PO TABS: 5.0000 mg | ORAL_TABLET | Freq: Every day | ORAL | 0 refills | Status: DC

## 2017-10-03 MED ORDER — GABAPENTIN 100 MG PO CAPS: 100.0000 mg | ORAL_CAPSULE | Freq: Three times a day (TID) | ORAL | 0 refills | Status: DC

## 2017-10-03 NOTE — Progress Notes (Signed)
Subjective:    Patient ID: Lisa Ellis, female    DOB: [birth year], 42 y.o.   MRN: [id number]  HPI  Pt in with some left upper back pain. She has history of this in the past. 2 weeks after concert had pain that flared. Actually she points to left trapezius but reports some neck pain. No pain that shoots to her arms. Then states random pain that shoots to proximal arm. Occasional transient pain toward chest. She has been to cardiologist who did stress test which pt states was negative. Pt states not really concerned unless she has constant pain. No current pain now in chest(none recently as well).  She also states left elbow pain as well. This has been going on for months/since march.   Pt does not report any pain in wrists, or knees. Rare occasional left ankle pain. But suffered fracture years ago to left ankle.  Pt sometime from mid thorax up she states feels achy. She is concerned for fibromyalgia.   Review of Systems  Constitutional: Negative for chills, fatigue and fever.  Respiratory: Negative for cough, chest tightness, shortness of breath and wheezing.   Cardiovascular: Negative for chest pain and palpitations.  Gastrointestinal: Negative for abdominal pain.  Musculoskeletal:       See hpi.  Neurological: Negative for dizziness, seizures, syncope and headaches.  Hematological: Negative for adenopathy. Does not bruise/bleed easily.  Psychiatric/Behavioral: Negative for behavioral problems and confusion.   Past Medical History:  Diagnosis Date  . Allergy   . Asthma    Only wheezed on time associated with pneumonia. none since.  . Depression    only had brief episode in 2006 for couple of months when dad passed away.     Social History   Socioeconomic History  . Marital status: Married    Spouse name: Not on file  . Number of children: Not on file  . Years of education: Not on file  . Highest education level: Not on file  Occupational History  . Not on file  Social  Needs  . Financial resource strain: Not on file  . Food insecurity:    Worry: Not on file    Inability: Not on file  . Transportation needs:    Medical: Not on file    Non-medical: Not on file  Tobacco Use  . Smoking status: Never Smoker  . Smokeless tobacco: Never Used  Substance and Sexual Activity  . Alcohol use: No    Alcohol/week: 0.0 oz  . Drug use: No  . Sexual activity: Yes  Lifestyle  . Physical activity:    Days per week: Not on file    Minutes per session: Not on file  . Stress: Not on file  Relationships  . Social connections:    Talks on phone: Not on file    Gets together: Not on file    Attends religious service: Not on file    Active member of club or organization: Not on file    Attends meetings of clubs or organizations: Not on file    Relationship status: Not on file  . Intimate partner violence:    Fear of current or ex partner: Not on file    Emotionally abused: Not on file    Physically abused: Not on file    Forced sexual activity: Not on file  Other Topics Concern  . Not on file  Social History Narrative  . Not on file    Past Surgical History:  Procedure  Laterality Date  . AUGMENTATION MAMMAPLASTY    . BREAST SURGERY     augmentation procedure.  . CHOLECYSTECTOMY    . NASAL SINUS SURGERY      Family History  Problem Relation Age of Onset  . Hypertension Mother   . Diabetes Father   . Hypertension Maternal Grandfather   . Diabetes Paternal Grandmother     No Known Allergies  Current Outpatient Medications on File Prior to Visit  Medication Sig Dispense Refill  . aspirin EC 81 MG tablet Take 1 tablet (81 mg total) by mouth daily. 90 tablet 3   Current Facility-Administered Medications on File Prior to Visit  Medication Dose Route Frequency Provider Last Rate Last Dose  . 0.9 %  sodium chloride infusion   Intravenous Once Jaynee Eagles, PA-C        BP 126/71   Pulse 76   Temp 97.8 F (36.6 C) (Oral)   Resp 16   Ht 5\' 7"   (1.702 m)   Wt 237 lb (107.5 kg)   SpO2 99%   BMI 37.12 kg/m       Objective:   Physical Exam  General- No acute distress. Pleasant patient. Neck- Full range of motion, no jvd Lungs- Clear, even and unlabored. Heart- regular rate and rhythm. Neurologic- CNII- XII grossly intact.  Neck- mild faint mid cspine pain.  Lt trapezius tenderness to palpation. Lt arm- lateral epicondyle tenderness to palpation.     Assessment & Plan:  For your neck pain and left trapezius pain, I do want to get cervical spine x-ray.  Orders were placed today.  For your left elbow/likely lateral epicondylitis, I want you to get a tennis elbow brace and use it daily.  For both above areas, I prescribed meloxicam.  For the neck pain/trapezius tightness, I am making Flexeril muscle relaxant available to use at night.  You expressed some concern for possible fibromyalgia based on your diffuse slight achiness in upper body/above mid thoracic spine level.  Based on this I did decide to prescribe you gabapentin.  Use at night initially and can titrate upward as we discussed.  This might help with both muscle and joint pain.   Follow-up in 2 to 3 weeks or as needed.  Note if left elbow pain persist despite above measures then could consider referral to sports medicine.  [name], PA-C

## 2017-10-03 NOTE — Patient Instructions (Signed)
For your neck pain and left trapezius pain, I do want to get cervical spine x-ray.  Orders were placed today.  For your left elbow/likely lateral epicondylitis, I want you to get a tennis elbow brace and use it daily.  For both above areas, I prescribed meloxicam.  For the neck pain/trapezius tightness, I am making Flexeril muscle relaxant available to use at night.  You expressed some concern for possible fibromyalgia based on your diffuse slight achiness in upper body/above mid thoracic spine level.  Based on this I did decide to prescribe you gabapentin.  Use at night initially and can titrate upward as we discussed.  This might help with both muscle and joint pain.   Follow-up in 2 to 3 weeks or as needed.  Note if left elbow pain persist despite above measures then could consider referral to sports medicine.

## 2017-10-10 ENCOUNTER — Encounter: Payer: Self-pay | Admitting: Medical

## 2017-10-13 ENCOUNTER — Ambulatory Visit (HOSPITAL_BASED_OUTPATIENT_CLINIC_OR_DEPARTMENT_OTHER)
Admission: RE | Admit: 2017-10-13 | Discharge: 2017-10-13 | Disposition: A | Discharge status: Home / Self Care | Payer: BLUE CROSS/BLUE SHIELD | Source: Ambulatory Visit | Attending: Medical | Admitting: Medical

## 2017-10-13 DIAGNOSIS — M542 Cervicalgia: Secondary | ICD-10-CM | POA: Insufficient documentation

## 2017-11-08 DIAGNOSIS — L02235 Carbuncle of perineum: Secondary | ICD-10-CM | POA: Diagnosis not present

## 2017-11-08 DIAGNOSIS — R454 Irritability and anger: Secondary | ICD-10-CM | POA: Diagnosis not present

## 2017-11-08 DIAGNOSIS — L709 Acne, unspecified: Secondary | ICD-10-CM | POA: Diagnosis not present

## 2017-11-08 DIAGNOSIS — R635 Abnormal weight gain: Secondary | ICD-10-CM | POA: Diagnosis not present

## 2018-02-05 DIAGNOSIS — M542 Cervicalgia: Secondary | ICD-10-CM | POA: Diagnosis not present

## 2018-02-05 DIAGNOSIS — M5412 Radiculopathy, cervical region: Secondary | ICD-10-CM | POA: Diagnosis not present

## 2018-02-05 DIAGNOSIS — Z6841 Body Mass Index (BMI) 40.0 and over, adult: Secondary | ICD-10-CM | POA: Diagnosis not present

## 2018-02-15 ENCOUNTER — Other Ambulatory Visit (HOSPITAL_BASED_OUTPATIENT_CLINIC_OR_DEPARTMENT_OTHER): Payer: Self-pay | Admitting: Obstetrics and Gynecology

## 2018-02-15 DIAGNOSIS — Z1231 Encounter for screening mammogram for malignant neoplasm of breast: Secondary | ICD-10-CM

## 2018-02-15 DIAGNOSIS — M5021 Other cervical disc displacement,  high cervical region: Secondary | ICD-10-CM | POA: Diagnosis not present

## 2018-02-19 ENCOUNTER — Ambulatory Visit (HOSPITAL_BASED_OUTPATIENT_CLINIC_OR_DEPARTMENT_OTHER)
Admission: RE | Admit: 2018-02-19 | Discharge: 2018-02-19 | Disposition: A | Discharge status: Home / Self Care | Payer: BLUE CROSS/BLUE SHIELD | Source: Ambulatory Visit | Attending: Obstetrics and Gynecology | Admitting: Obstetrics and Gynecology

## 2018-02-19 ENCOUNTER — Encounter (HOSPITAL_BASED_OUTPATIENT_CLINIC_OR_DEPARTMENT_OTHER): Payer: Self-pay

## 2018-02-19 DIAGNOSIS — Z1231 Encounter for screening mammogram for malignant neoplasm of breast: Secondary | ICD-10-CM | POA: Insufficient documentation

## 2018-03-14 DIAGNOSIS — M5412 Radiculopathy, cervical region: Secondary | ICD-10-CM | POA: Diagnosis not present

## 2018-04-09 DIAGNOSIS — R635 Abnormal weight gain: Secondary | ICD-10-CM | POA: Diagnosis not present

## 2018-04-09 DIAGNOSIS — R3915 Urgency of urination: Secondary | ICD-10-CM | POA: Diagnosis not present

## 2018-04-09 DIAGNOSIS — N898 Other specified noninflammatory disorders of vagina: Secondary | ICD-10-CM | POA: Diagnosis not present

## 2018-04-09 DIAGNOSIS — N3941 Urge incontinence: Secondary | ICD-10-CM | POA: Diagnosis not present

## 2018-04-09 DIAGNOSIS — Z01419 Encounter for gynecological examination (general) (routine) without abnormal findings: Secondary | ICD-10-CM | POA: Diagnosis not present

## 2018-04-22 ENCOUNTER — Ambulatory Visit: Payer: Self-pay

## 2018-04-22 NOTE — Telephone Encounter (Signed)
Pt with atypical chest pain c/o chest "ache like when you are sore after coughing too much" x 4 days. Pt stated the ache is constant but is worse in the evenings and does not worsen with exertion. Pt has had a dry cough doe the past 4 days. Pt stated she has pain to her neck and arms and back but she stated that she has a pinched nerve at C5 C6. Pt denies SOB, sweating. Pt stated that she is having low grade nausea at night. Per chart review, pt has been evaluated and seen by a cardiologist last year and had a stress test that was normal. Per cardiology's note the pt's chest pain is not heart related.  Pt stated her pain is mild but called because  is ongoing. Pt has dyslipidemia but has not been started on a statin, pt is over weight. Pt stated that her face will get red and feel hot for approximately 10 minutes then it will go away.  Pt mentioned that she was thinking about canceling her appointment tomorrow. Advised not to cancel. Pt advised to call if chest pain (ache) worsens, has difficulty breathing, or if she worsens.  Pt stated she will keep appointment tomorrow.   Reason for Disposition . Chest pain lasts > 5 minutes (Exceptions: chest pain occurring > 3 days ago and now asymptomatic; same as previously diagnosed heartburn and has accompanying sour taste in mouth)  Answer Assessment - Initial Assessment Questions 1. LOCATION: "Where does it hurt?"       Center of chest-ache like a sore muscle 126/85 2. RADIATION: "Does the pain go anywhere else?" (e.g., into neck, jaw, arms, back)     Goes into neck arms and back pt stated that she has a impinged nerve C5-C6 3. ONSET: "When did the chest pain begin?" (Minutes, hours or days)      4 days ago 4. PATTERN "Does the pain come and go, or has it been constant since it started?"  "Does it get worse with exertion?"      Constant worse later in the evenings- no worsening pain with exertion 5. DURATION: "How long does it last" (e.g., seconds,  minutes, hours)     Constantly for the last 4 days 6. SEVERITY: "How bad is the pain?"  (e.g., Scale 1-10; mild, moderate, or severe)    - MILD (1-3): doesn't interfere with normal activities     - MODERATE (4-7): interferes with normal activities or awakens from sleep    - SEVERE (8-10): excruciating pain, unable to do any normal activities      mild 7. CARDIAC RISK FACTORS: "Do you have any history of heart problems or risk factors for heart disease?" (e.g., prior heart attack, angina; high blood pressure, diabetes, being overweight, high cholesterol, smoking, or strong family history of heart disease)     Overweight  8. PULMONARY RISK FACTORS: "Do you have any history of lung disease?"  (e.g., blood clots in lung, asthma, emphysema, birth control pills)      pneumonia with wheezing 10 years 6. CAUSE: "What do you think is causing the chest pain?"     Pt doesn't know maybe issue with impinged nerve 10. OTHER SYMPTOMS: "Do you have any other symptoms?" (e.g., dizziness, nausea, vomiting, sweating, fever, difficulty breathing, cough)       Cough (dry) nauseated during the night 4 days now, dry cough, face get hot and flushed and 10 minutes later its gone 11. PREGNANCY: "Is there any chance you  are pregnant?" "When was your last menstrual period?"       LMP: January 17th - not pregnant  Protocols used: CHEST PAIN-A-AH

## 2018-04-23 ENCOUNTER — Ambulatory Visit: Payer: BLUE CROSS/BLUE SHIELD | Admitting: Medical

## 2018-04-23 ENCOUNTER — Encounter: Payer: Self-pay | Admitting: Medical

## 2018-04-23 VITALS — BP 123/80 | HR 73 | Temp 98.4°F | Resp 16 | Ht 67.0 in | Wt 246.0 lb

## 2018-04-23 DIAGNOSIS — R109 Unspecified abdominal pain: Secondary | ICD-10-CM

## 2018-04-23 DIAGNOSIS — F32A Depression, unspecified: Secondary | ICD-10-CM

## 2018-04-23 DIAGNOSIS — F329 Major depressive disorder, single episode, unspecified: Secondary | ICD-10-CM | POA: Diagnosis not present

## 2018-04-23 DIAGNOSIS — R0789 Other chest pain: Secondary | ICD-10-CM

## 2018-04-23 DIAGNOSIS — F419 Anxiety disorder, unspecified: Secondary | ICD-10-CM

## 2018-04-23 DIAGNOSIS — M542 Cervicalgia: Secondary | ICD-10-CM

## 2018-04-23 LAB — COMPREHENSIVE METABOLIC PANEL
ALK PHOS: 69 U/L (ref 39–117)
ALT: 17 U/L (ref 0–35)
AST: 15 U/L (ref 0–37)
Albumin: 4.3 g/dL (ref 3.5–5.2)
BUN: 13 mg/dL (ref 6–23)
CO2: 30 mEq/L (ref 19–32)
CREATININE: 0.75 mg/dL (ref 0.40–1.20)
Calcium: 9.4 mg/dL (ref 8.4–10.5)
Chloride: 101 mEq/L (ref 96–112)
GFR: 83.94 mL/min (ref >60.00)
GLUCOSE: 76 mg/dL (ref 70–99)
POTASSIUM: 3.9 meq/L (ref 3.5–5.1)
SODIUM: 137 meq/L (ref 135–145)
TOTAL PROTEIN: 7.2 g/dL (ref 6.0–8.3)
Total Bilirubin: 0.3 mg/dL (ref 0.2–1.2)

## 2018-04-23 LAB — AMYLASE: Amylase: 22 U/L — ABNORMAL LOW (ref 27–131)

## 2018-04-23 LAB — TROPONIN I: TNIDX: 0 ug/l (ref 0.00–0.06)

## 2018-04-23 LAB — LIPASE: Lipase: 19 U/L (ref 11.0–59.0)

## 2018-04-23 MED ORDER — KETOROLAC TROMETHAMINE 60 MG/2ML IM SOLN
60.0000 mg | Freq: Once | INTRAMUSCULAR | Status: AC
  Administered 2018-04-23: 60 mg via INTRAMUSCULAR

## 2018-04-23 MED ORDER — BUSPIRONE HCL 7.5 MG PO TABS: 7.5000 mg | ORAL_TABLET | Freq: Two times a day (BID) | ORAL | 0 refills | Status: DC

## 2018-04-23 MED ORDER — OMEPRAZOLE 40 MG PO CPDR: 40.0000 mg | DELAYED_RELEASE_CAPSULE | Freq: Every day | ORAL | 3 refills | Status: DC

## 2018-04-23 MED ORDER — BUPROPION HCL ER (XL) 150 MG PO TB24: 150.0000 mg | ORAL_TABLET | Freq: Every day | ORAL | 3 refills | Status: DC

## 2018-04-23 NOTE — Progress Notes (Signed)
Subjective:    Patient ID: Lisa Ellis, female    DOB: 1975/07/31, 43 y.o.   MRN: [id number]  HPI  Pt in with achiness in her chest mid and some to left side. Pt states pain since last week. Will come and go. Pt states achiness in chest occurs even at rest. Pt states she does not take anything for this discomfort. Just one dose of aspirin at night. Pt has seen cardiologist for history of pain like this before. She had stress echo in past. Study was negative.  Impressions:  - 1. Stress echo negative for evidence of inducible ischemia.   Normal left ventricular systolic function at stress and rest.   Good augmentation of the segments at peak of exercise.   2. Fair exercise capacity and hypertensive response.  Most recent ekg in epic looked nsr.  Pt states has not been exercising. Pt states not associating direct pain in mid chest with neck or shoulder pain. Though she has pain in these area at separate times.    Pt states she has bloated sensation in her abdomen. Sometimes nausea at night.  Not reporting any heart burn.  Pt also has neck pain hx and has impinged nerve at c5-c6 left side. Pt was offered epidural. She instead got oral prednisone.  A lot of stress related to work. States high level constant stress for 2 months and feeling depressed.   Review of Systems  Constitutional: Negative for chills, fatigue and fever.  HENT: Negative for congestion, ear pain and facial swelling.   Respiratory: Negative for cough, chest tightness, shortness of breath and wheezing.   Cardiovascular: Negative for chest pain and palpitations.  Gastrointestinal: Positive for abdominal pain. Negative for anal bleeding, constipation, diarrhea and vomiting.  Genitourinary: Negative for dysuria and flank pain.  Musculoskeletal: Negative for back pain.  Neurological: Negative for dizziness, syncope, weakness, numbness and headaches.  Hematological: Negative for adenopathy. Does not bruise/bleed easily.   Psychiatric/Behavioral: Positive for dysphoric mood. Negative for behavioral problems, confusion, self-injury, sleep disturbance and suicidal ideas. The patient is nervous/anxious. The patient is not hyperactive.        Some crying at time no reason.   Past Medical History:  Diagnosis Date  . Allergy   . Asthma    Only wheezed on time associated with pneumonia. none since.  . Depression    only had brief episode in 2006 for couple of months when dad passed away.     Social History   Socioeconomic History  . Marital status: Married    Spouse name: Not on file  . Number of children: Not on file  . Years of education: Not on file  . Highest education level: Not on file  Occupational History  . Not on file  Social Needs  . Financial resource strain: Not on file  . Food insecurity:    Worry: Not on file    Inability: Not on file  . Transportation needs:    Medical: Not on file    Non-medical: Not on file  Tobacco Use  . Smoking status: Never Smoker  . Smokeless tobacco: Never Used  Substance and Sexual Activity  . Alcohol use: No    Alcohol/week: 0.0 standard drinks  . Drug use: No  . Sexual activity: Yes  Lifestyle  . Physical activity:    Days per week: Not on file    Minutes per session: Not on file  . Stress: Not on file  Relationships  . Social connections:  Talks on phone: Not on file    Gets together: Not on file    Attends religious service: Not on file    Active member of club or organization: Not on file    Attends meetings of clubs or organizations: Not on file    Relationship status: Not on file  . Intimate partner violence:    Fear of current or ex partner: Not on file    Emotionally abused: Not on file    Physically abused: Not on file    Forced sexual activity: Not on file  Other Topics Concern  . Not on file  Social History Narrative  . Not on file    Past Surgical History:  Procedure Laterality Date  . AUGMENTATION MAMMAPLASTY    .  BREAST SURGERY     augmentation procedure.  . CHOLECYSTECTOMY    . NASAL SINUS SURGERY      Family History  Problem Relation Age of Onset  . Hypertension Mother   . Diabetes Father   . Hypertension Maternal Grandfather   . Diabetes Paternal Grandmother     No Known Allergies  Current Outpatient Medications on File Prior to Visit  Medication Sig Dispense Refill  . aspirin EC 81 MG tablet Take 1 tablet (81 mg total) by mouth daily. 90 tablet 3   Current Facility-Administered Medications on File Prior to Visit  Medication Dose Route Frequency Provider Last Rate Last Dose  . 0.9 %  sodium chloride infusion   Intravenous Once Jaynee Eagles, PA-C        BP 123/80   Pulse 73   Temp 98.4 F (36.9 C) (Oral)   Resp 16   Ht 5\' 7"  (1.702 m)   Wt 246 lb (111.6 kg)   SpO2 99%   BMI 38.53 kg/m       Objective:   Physical Exam   General Mental Status- Alert. General Appearance- Not in acute distress.   Skin General: Color- Normal Color. Moisture- Normal Moisture.  Neck Carotid Arteries- Normal color. Moisture- Normal Moisture. No carotid bruits. No JVD.  Chest and Lung Exam Auscultation: Breath Sounds:-Normal.  Cardiovascular Auscultation:Rythm- Regular. Murmurs & Other Heart Sounds:Auscultation of the heart reveals- No Murmurs.  Abdomen Inspection:-Inspeection Normal. Palpation/Percussion:Note:No mass. Palpation and Percussion of the abdomen reveal- Non Tender, Non Distended + BS, no rebound or guarding.   Neurologic Cranial Nerve exam:- CN III-XII intact(No nystagmus), symmetric smile. Strength:- 5/5 equal and symmetric strength both upper and lower extremities.  Anterior thorax- mid and left side costochandral junction tenderness.     Assessment & Plan:  For your history of neck pain, continue management plan orthopedist.  For recent likely costochondral junction pain/costochondritis, we gave you Toradol 60 mg IM injection.  I want you to give me an update  if this relieved your pain.  Your EKG showed normal sinus rhythm.  Since your pain is daily we will proceed with caution and get 1 set of troponin stat.  If that level is elevated then will advise you to go to emergency department.  I want you to call me tomorrow and give me an update on response to Toradol.  If no response at all then I do think it would be best to refer you back to cardiologist to see if they think any other tests need to be done.  You have some direct tenderness over epigastric area.  Some worse pain when you are lying supine at night.  This causes me to think this might  be reflux type pain.  Will prescribe omeprazole 40mg  tabs and see if this helps.  We will also get CBC, CMP, amylase, lipase and H. pylori breath test.  With your history of recent depression, I do think it might be good idea for you to start the Wellbutrin.  This can help with depression and some patient's weight loss is side effect.  I am going to make low-dose medication called BuSpar to help with potential anxiety.  Follow-up in 2 weeks or as needed.  [name], PA-C

## 2018-04-23 NOTE — Addendum Note (Signed)
Addended by: Hinton Dyer on: 04/23/2018 02:56 PM   Modules accepted: Orders

## 2018-04-23 NOTE — Patient Instructions (Signed)
For your history of neck pain, continue management plan orthopedist.  For recent likely costochondral junction pain/costochondritis, we gave you Toradol 60 mg IM injection.  I want you to give me an update if this relieved your pain.  Your EKG showed normal sinus rhythm.  Since your pain is daily we will proceed with caution and get 1 set of troponin stat.  If that level is elevated then will advise you to go to emergency department.  I want you to call me tomorrow and give me an update on response to Toradol.  If no response at all then I do think it would be best to refer you back to cardiologist to see if they think any other tests need to be done.  You have some direct tenderness over epigastric area.  Some worse pain when you are lying supine at night.  This causes me to think this might be reflux type pain.  Will prescribe omeprazole 40mg  tabs and see if this helps.  We will also get CBC, CMP, amylase, lipase and H. pylori breath test.  With your history of recent depression, I do think it might be good idea for you to start the Wellbutrin.  This can help with depression and some patient's weight loss is side effect.  I am going to make low-dose medication called BuSpar to help with potential anxiety.  Follow-up in 2 weeks or as needed.

## 2018-04-24 LAB — H. PYLORI BREATH TEST: H. pylori Breath Test: NOT DETECTED

## 2018-04-26 DIAGNOSIS — L821 Other seborrheic keratosis: Secondary | ICD-10-CM | POA: Diagnosis not present

## 2018-04-26 DIAGNOSIS — C44319 Basal cell carcinoma of skin of other parts of face: Secondary | ICD-10-CM | POA: Diagnosis not present

## 2018-04-26 DIAGNOSIS — L814 Other melanin hyperpigmentation: Secondary | ICD-10-CM | POA: Diagnosis not present

## 2018-04-26 DIAGNOSIS — L720 Epidermal cyst: Secondary | ICD-10-CM | POA: Diagnosis not present

## 2018-07-05 DIAGNOSIS — H938X1 Other specified disorders of right ear: Secondary | ICD-10-CM | POA: Diagnosis not present

## 2018-07-05 DIAGNOSIS — R030 Elevated blood-pressure reading, without diagnosis of hypertension: Secondary | ICD-10-CM | POA: Diagnosis not present

## 2018-07-23 ENCOUNTER — Encounter: Payer: Self-pay | Admitting: Medical

## 2018-07-23 ENCOUNTER — Ambulatory Visit (INDEPENDENT_AMBULATORY_CARE_PROVIDER_SITE_OTHER): Payer: BLUE CROSS/BLUE SHIELD | Admitting: Medical

## 2018-07-23 ENCOUNTER — Other Ambulatory Visit: Payer: Self-pay

## 2018-07-23 VITALS — BP 126/74 | HR 70

## 2018-07-23 DIAGNOSIS — M654 Radial styloid tenosynovitis [de Quervain]: Secondary | ICD-10-CM | POA: Diagnosis not present

## 2018-07-23 DIAGNOSIS — M79645 Pain in left finger(s): Secondary | ICD-10-CM

## 2018-07-23 MED ORDER — MELOXICAM 7.5 MG PO TABS: ORAL_TABLET | ORAL | 0 refills | Status: DC

## 2018-07-23 NOTE — Progress Notes (Signed)
   Subjective:    Patient ID: Lisa Ellis, female    DOB: 11-Dec-1975, 43 y.o.   MRN: [id number]  HPI  Virtual Visit via Video Note  I connected with Lisa Ellis on 07/23/18 at  2:40 PM EDT by a video enabled telemedicine application and verified that I am speaking with the correct person using two identifiers.  Location: Patient: home Provider: home.  Pt has not checked her bp today.  She has to find her bp cuff. She did update me and sent me my chart message    I discussed the limitations of evaluation and management by telemedicine and the availability of in person appointments. The patient expressed understanding and agreed to proceed.   History of Present Illness:  Pt states at base of her thumb/wrist hurts over last 2-3 days. She states pain on bending her wrist. Pain runs up her forearm. No fall or trauma. Pt is rt handed. No repetitive acitivity except she types a lot all day. She has faint rash over area and feels.     Observations/Objective: General-no acute distress. Left hand and wrist normal- but has + finkelstein test. Some pain just below wrist on palpation per pt.   Assessment and Plan: You do describe tenosynovitis of her left thumb.  Positive findings on physical exam as well.  Will prescribe meloxicam NSAID and recommend that she get thumb spica splint over-the-counter.  I will see if your pain gradually resolves but if not then would consider x-ray of left thumb and referral to sports medicine.  You mention one small area that feels a little warm and has a little pinkish-red rash.  This happens to be over same area where tendon is.  Will watch this area closely if you get any worsening redness, warmth or tenderness let me know as in that event would give you antibiotic.  Presently examined history supports more tenosynovitis.  Follow-up in 10 to 14 days if he has any residual or persistent pain.  Sooner if signs symptoms change or worsen.  Also asked that you  check your blood pressure and send me update by my chart.  Want to make sure blood pressures not over 140/90 as NSAIDs can increase blood pressure.  Follow Up Instructions:    I discussed the assessment and treatment plan with the patient. The patient was provided an opportunity to ask questions and all were answered. The patient agreed with the plan and demonstrated an understanding of the instructions.   The patient was advised to call back or seek an in-person evaluation if the symptoms worsen or if the condition fails to improve as anticipated.     [name], PA-C   Review of Systems     Objective:   Physical Exam        Assessment & Plan:

## 2018-07-23 NOTE — Patient Instructions (Signed)
You do describe tenosynovitis of her left thumb.  Positive findings on physical exam as well.  Will prescribe meloxicam NSAID and recommend that she get thumb spica splint over-the-counter.  I will see if your pain gradually resolves but if not then would consider x-ray of left thumb and referral to sports medicine.  You mention one small area that feels a little warm and has a little pinkish-red rash.  This happens to be over same area where tendon is.  Will watch this area closely if you get any worsening redness, warmth or tenderness let me know as in that event would give you antibiotic.  Presently examined history supports more tenosynovitis.  Follow-up in 10 to 14 days if he has any residual or persistent pain.  Sooner if signs symptoms change or worsen.  Also asked that you check your blood pressure and send me update by my chart.  Want to make sure blood pressures not over 140/90 as NSAIDs can increase blood pressure.

## 2018-08-29 DIAGNOSIS — D2372 Other benign neoplasm of skin of left lower limb, including hip: Secondary | ICD-10-CM | POA: Diagnosis not present

## 2018-08-29 DIAGNOSIS — M2042 Other hammer toe(s) (acquired), left foot: Secondary | ICD-10-CM | POA: Diagnosis not present

## 2018-08-29 DIAGNOSIS — M2012 Hallux valgus (acquired), left foot: Secondary | ICD-10-CM | POA: Diagnosis not present

## 2018-08-30 ENCOUNTER — Encounter: Payer: Self-pay | Admitting: *Deleted

## 2018-08-30 ENCOUNTER — Other Ambulatory Visit: Payer: Self-pay

## 2018-08-30 ENCOUNTER — Emergency Department
Admission: EM | Admit: 2018-08-30 | Discharge: 2018-08-30 | Disposition: A | Discharge status: Home / Self Care | Payer: BLUE CROSS/BLUE SHIELD | Source: Home / Self Care | Attending: Family Medicine | Admitting: Family Medicine

## 2018-08-30 ENCOUNTER — Ambulatory Visit: Payer: Self-pay

## 2018-08-30 DIAGNOSIS — K219 Gastro-esophageal reflux disease without esophagitis: Secondary | ICD-10-CM | POA: Diagnosis not present

## 2018-08-30 MED ORDER — OMEPRAZOLE 40 MG PO CPDR: DELAYED_RELEASE_CAPSULE | ORAL | 1 refills | Status: DC

## 2018-08-30 NOTE — Telephone Encounter (Signed)
Phone call from pt.  Reported onset of pain in base of anterior neck, that radiated down through middle of chest to the upper abdomen, and then radiated back up through the chest, to the chin and right ear; rated pain at 6-7/10, initially.  Reported pain started about 10:30 AM.  Stated she was sweaty during the initial episode, and felt slightly nauseated.  At present, reported the pain is "like a cramp" in the chest; rated at 4/10.  Denied shortness of breath.  Denied feeling any heart palpitations.  Reported BP 145/92, and 125/82, checked prior to call.  Denied any previous chest pain or heart problems.  Advised to go to UC or ER at this time, to rule out cardiac cause of symptoms.  Pt. Verb. Understanding and agreed.    Reason for Disposition . Chest pain lasts > 5 minutes (Exceptions: chest pain occurring > 3 days ago and now asymptomatic; same as previously diagnosed heartburn and has accompanying sour taste in mouth)  Answer Assessment - Initial Assessment Questions 1. LOCATION: "Where does it hurt?"       C/o pain from base of neck down through center of chest to upper abdomen and back up to right chin and right ear  2. RADIATION: "Does the pain go anywhere else?" (e.g., into neck, jaw, arms, back)     See above 3. ONSET: "When did the chest pain begin?" (Minutes, hours or days)      30 minutes ago; about 10 "   4. PATTERN "Does the pain come and go, or has it been constant since it started?"  "Does it get worse with exertion?"      Constant  5. DURATION: "How long does it last" (e.g., seconds, minutes, hours)     About 30 minutes 6. SEVERITY: "How bad is the pain?"  (e.g., Scale 1-10; mild, moderate, or severe)    - MILD (1-3): doesn't interfere with normal activities     - MODERATE (4-7): interferes with normal activities or awakens from sleep    - SEVERE (8-10): excruciating pain, unable to do any normal activities       6-7/10; like a cramp 7. CARDIAC RISK FACTORS: "Do you have any  history of heart problems or risk factors for heart disease?" (e.g., prior heart attack, angina; high blood pressure, diabetes, being overweight, high cholesterol, smoking, or strong family history of heart disease)     Overweight; otherwise neg. for above risk factors; Grandfather heart disease 8. PULMONARY RISK FACTORS: "Do you have any history of lung disease?"  (e.g., blood clots in lung, asthma, emphysema, birth control pills)     Denied the above 9. CAUSE: "What do you think is causing the chest pain?"     unknown 10. OTHER SYMPTOMS: "Do you have any other symptoms?" (e.g., dizziness, nausea, vomiting, sweating, fever, difficulty breathing, cough)       Slight sweaty, slight nausea, discomfort at bask of neck and throat area  11. PREGNANCY: "Is there any chance you are pregnant?" "When was your last menstrual period?"       LMP last week.  Protocols used: CHEST PAIN-A-AH

## 2018-08-30 NOTE — Discharge Instructions (Addendum)
Refill omeprazole if symptoms improve.

## 2018-08-30 NOTE — ED Provider Notes (Signed)
[facility]    CSN: [id number] Arrival date & time: 08/30/18  1217     History   Chief Complaint Chief Complaint  Patient presents with  . Chest Pain    HPI Lisa Ellis is a 43 y.o. female.   About 3 hours ago patient developed cramping substernal chest pain that radiated to her right ear and down to her epigastric area.  The pain lasted about 8 minutes, and she still has mild discomfort in her epigastric area.  She initially had sweats for about 6 to 7 minutes, now resolved.  She denies shortness of breath, cough, or pleuritic pain.  She denies nausea/vomiting, and feels well otherwise. She has a family history of GERD (mother)  The history is provided by the patient.  Chest Pain Pain location:  Substernal area Pain quality comment:  Cramping Pain radiates to:  Does not radiate Pain severity:  Moderate Onset quality:  Sudden Duration:  8 minutes Timing:  Constant Progression:  Improving Chronicity:  New Context: at rest   Relieved by:  None tried Worsened by:  Nothing Ineffective treatments:  None tried Associated symptoms: no abdominal pain, no AICD problem, no anxiety, no cough, no diaphoresis, no dysphagia, no fatigue, no fever, no heartburn, no lower extremity edema, no nausea, no near-syncope, no palpitations, no PND, no shortness of breath, no syncope and no vomiting   Risk factors: obesity     Past Medical History:  Diagnosis Date  . Allergy   . Asthma    Only wheezed on time associated with pneumonia. none since.  . Depression    only had brief episode in 2006 for couple of months when dad passed away.    Patient Active Problem List   Diagnosis Date Noted  . Dyslipidemia 06/15/2017  . Wellness examination 12/22/2014  . Atypical chest pain 11/12/2014  . Upper back pain on left side 11/12/2014  . Costochondritis 11/12/2014  . Allergic rhinitis 11/12/2014    Past Surgical History:  Procedure Laterality Date  . AUGMENTATION MAMMAPLASTY     . BREAST SURGERY     augmentation procedure.  . CHOLECYSTECTOMY    . NASAL SINUS SURGERY      OB History   No obstetric history on file.      Home Medications    Prior to Admission medications   Medication Sig Start Date End Date Taking? Authorizing Provider  aspirin EC 81 MG tablet Take 1 tablet (81 mg total) by mouth daily. 06/15/17   Park Liter, MD  omeprazole (PRILOSEC) 40 MG capsule Take one cap PO once daily 20 minutes before evening meal 08/30/18   Kandra Nicolas, MD    Family History Family History  Problem Relation Age of Onset  . Hypertension Mother   . Diabetes Father   . Hypertension Maternal Grandfather   . Diabetes Paternal Grandmother     Social History Social History   Tobacco Use  . Smoking status: Never Smoker  . Smokeless tobacco: Never Used  Substance Use Topics  . Alcohol use: No    Alcohol/week: 0.0 standard drinks  . Drug use: No     Allergies   Patient has no known allergies.   Review of Systems Review of Systems  Constitutional: Negative for diaphoresis, fatigue and fever.  HENT: Negative for trouble swallowing.   Respiratory: Negative for cough and shortness of breath.   Cardiovascular: Positive for chest pain. Negative for palpitations, syncope, PND and near-syncope.  Gastrointestinal: Negative for abdominal pain,  heartburn, nausea and vomiting.  All other systems reviewed and are negative.    Physical Exam Triage Vital Signs ED Triage Vitals  Enc Vitals Group     BP 08/30/18 1310 (!) 150/82     Pulse Rate 08/30/18 1310 70     Resp 08/30/18 1310 18     Temp 08/30/18 1310 98.1 F (36.7 C)     Temp Source 08/30/18 1310 Oral     SpO2 08/30/18 1310 99 %     Weight 08/30/18 1312 245 lb (111.1 kg)     Height 08/30/18 1312 5\' 7"  (1.702 m)     Head Circumference --      Peak Flow --      Pain Score 08/30/18 1312 2     Pain Loc --      Pain Edu? --      Excl. in [place]? --    No data found.  Updated Vital Signs BP  (!) 150/82 (BP Location: Right Arm)   Pulse 70   Temp 98.1 F (36.7 C) (Oral)   Resp 18   Ht 5\' 7"  (1.702 m)   Wt 111.1 kg   LMP 08/19/2018   SpO2 99%   BMI 38.37 kg/m   Visual Acuity Right Eye Distance:   Left Eye Distance:   Bilateral Distance:    Right Eye Near:   Left Eye Near:    Bilateral Near:     Physical Exam Vitals signs and nursing note reviewed.  Constitutional:      General: She is not in acute distress. HENT:     Head: Normocephalic.     Nose: Nose normal.     Mouth/Throat:     Pharynx: Oropharynx is clear.  Eyes:     Pupils: Pupils are equal, round, and reactive to light.  Cardiovascular:     Rate and Rhythm: Normal rate.     Heart sounds: Normal heart sounds.  Pulmonary:     Breath sounds: Normal breath sounds.  Abdominal:     Tenderness: There is no abdominal tenderness.  Musculoskeletal:     Right lower leg: No edema.     Left lower leg: No edema.  Lymphadenopathy:     Cervical: No cervical adenopathy.  Skin:    General: Skin is warm and dry.     Findings: No rash.  Neurological:     Mental Status: She is alert.        UC Treatments / Results  Labs (all labs ordered are listed, but only abnormal results are displayed) Labs Reviewed - No data to display  EKG  Rate:  75 BPM PR:  164 msec QT:  388 msec QTcH:  433 msec QRSD:  82 msec QRS axis:  43 degrees Interpretation: Normal sinus rhythm; within normal limits.  No significant change from tracing done 04/23/18.   Radiology No results found.  Procedures Procedures (including critical care time)  Medications Ordered in UC Medications - No data to display  Initial Impression / Assessment and Plan / UC Course  I have reviewed the triage vital signs and the nursing notes.  Pertinent labs & imaging results that were available during my care of the patient were reviewed by me and considered in my medical decision making (see chart for details).    Normal EKG and exam  reassuring. Suspect GI etiology. Begin trial of omeprazole 40mg  daily. Followup with Family Doctor in about one month.   Final Clinical Impressions(s) / UC Diagnoses  Final diagnoses:  Gastroesophageal reflux disease, esophagitis presence not specified     Discharge Instructions     Refill omeprazole if symptoms improve.    ED Prescriptions    Medication Sig Dispense Auth. Provider   omeprazole (PRILOSEC) 40 MG capsule Take one cap PO once daily 20 minutes before evening meal 15 capsule Kandra Nicolas, MD        Kandra Nicolas, MD 09/03/18 [phone]

## 2018-08-30 NOTE — ED Triage Notes (Signed)
Pt c/o an episode of cramping CP in the center of her chest that radiated to her RT ear and down to her epigastric area at 1030. The episode lasted for 7-8 minutes. The pain is still there, but is mild now.

## 2018-09-05 DIAGNOSIS — L02611 Cutaneous abscess of right foot: Secondary | ICD-10-CM | POA: Diagnosis not present

## 2018-09-05 DIAGNOSIS — L03032 Cellulitis of left toe: Secondary | ICD-10-CM | POA: Diagnosis not present

## 2018-09-05 DIAGNOSIS — L02612 Cutaneous abscess of left foot: Secondary | ICD-10-CM | POA: Diagnosis not present

## 2018-09-05 DIAGNOSIS — L03031 Cellulitis of right toe: Secondary | ICD-10-CM | POA: Diagnosis not present

## 2018-09-19 DIAGNOSIS — L720 Epidermal cyst: Secondary | ICD-10-CM | POA: Diagnosis not present

## 2018-10-18 ENCOUNTER — Encounter: Payer: Self-pay | Admitting: Family Medicine

## 2018-10-18 ENCOUNTER — Other Ambulatory Visit: Payer: Self-pay

## 2018-10-18 ENCOUNTER — Ambulatory Visit (INDEPENDENT_AMBULATORY_CARE_PROVIDER_SITE_OTHER): Payer: BC Managed Care – PPO | Admitting: Family Medicine

## 2018-10-18 DIAGNOSIS — R51 Headache: Secondary | ICD-10-CM

## 2018-10-18 DIAGNOSIS — R519 Headache, unspecified: Secondary | ICD-10-CM

## 2018-10-18 MED ORDER — ONDANSETRON HCL 4 MG PO TABS: 4.0000 mg | ORAL_TABLET | Freq: Three times a day (TID) | ORAL | 0 refills | Status: DC | PRN

## 2018-10-18 MED ORDER — PREDNISONE 20 MG PO TABS: 40.0000 mg | ORAL_TABLET | Freq: Every day | ORAL | 0 refills | Status: AC

## 2018-10-18 NOTE — Progress Notes (Signed)
Chief Complaint  Patient presents with  . Headache  . Nausea  . Neck Pain     Lisa Ellis is a 43 y.o. female here for evaluation of an acute headache. Due to COVID-19 pandemic, we are interacting via web portal for an electronic face-to-face visit. I verified patient's ID using 2 identifiers. Patient agreed to proceed with visit via this method. Patient is at home, I am at office. Patient and I are present for visit.   Duration: 1 week Laterality: left sinus area Quality: aching Associated symptoms: tearing of L eye, nausea Therapies tried: Tylenol, aspirin, Aleve Hx of migraines: No, does have hx of pinched nerve in neck and sinus issues No neurologic s/s's Has had increased stress over past week.   ROS:  Neuro: +HA MSK: +neck pain  Past Medical History:  Diagnosis Date  . Allergy   . Asthma    Only wheezed on time associated with pneumonia. none since.  . Depression    only had brief episode in 2006 for couple of months when dad passed away.   Exam No conversational dyspnea Age appropriate judgment and insight Nml affect and mood  Acute intractable headache, unspecified headache type - Plan: predniSONE (DELTASONE) 20 MG tablet, ondansetron (ZOFRAN) 4 MG tablet, pred burst, I think stress is contributing to migraines that could be masquerading as sinus headaches. If no improvement by [facility], will have her come in for Toradol injection.   Orders as above. F/u prn. The pt voiced understanding and agreement to the plan.  [place], DO 10/18/18 4:29 PM

## 2018-10-21 ENCOUNTER — Telehealth: Payer: Self-pay | Admitting: Medical

## 2018-10-21 ENCOUNTER — Telehealth: Payer: Self-pay | Admitting: *Deleted

## 2018-10-21 NOTE — Telephone Encounter (Signed)
Looks like someone from our office called to schedule patient and had to leave message.

## 2018-10-21 NOTE — Telephone Encounter (Signed)
  Chief Complaint Arm Pain (no known cause) Reason for Call Symptomatic / Request for Health Information Initial Comment Dr. put her on prednisone yesterday, today having severe pain in her right arm. Translation No Nurse Assessment Nurse: Jerene Bears, RN, Will Date/Time Eilene Ghazi Time): 10/20/2018 9:48:15 AM Confirm and document reason for call. If symptomatic, describe symptoms. ---Caller reports that she was prescribed prednisone for headache this past Friday via telemedicine visit. No headache at this time. Caller woke from sleep this morning with right shoulder pain that radiates down her bicep to her elbow. Intermittent pain, reproducible.

## 2018-10-21 NOTE — Telephone Encounter (Signed)
Called patient and unable to LVM to schedule appointment. Received after hours report on patient to have them come into the office for arm pain.

## 2018-10-21 NOTE — Telephone Encounter (Signed)
Can you get pt scheduled for tomorrow morning

## 2018-10-22 NOTE — Telephone Encounter (Signed)
Tried to call pt. Pt vm was full.

## 2018-10-25 ENCOUNTER — Telehealth: Payer: Self-pay | Admitting: Medical

## 2018-10-25 NOTE — Telephone Encounter (Signed)
Patient is calling to schedule appt for neck and back pain. Attempted to reach the office three times, with no answer. Please advise Cb- [phone]

## 2018-10-29 ENCOUNTER — Other Ambulatory Visit: Payer: Self-pay

## 2018-10-29 ENCOUNTER — Encounter: Payer: Self-pay | Admitting: Medical

## 2018-10-29 ENCOUNTER — Ambulatory Visit (INDEPENDENT_AMBULATORY_CARE_PROVIDER_SITE_OTHER): Payer: BC Managed Care – PPO | Admitting: Medical

## 2018-10-29 VITALS — BP 128/85 | HR 86

## 2018-10-29 DIAGNOSIS — M2550 Pain in unspecified joint: Secondary | ICD-10-CM

## 2018-10-29 MED ORDER — CYCLOBENZAPRINE HCL 10 MG PO TABS: 10.0000 mg | ORAL_TABLET | Freq: Every day | ORAL | 0 refills | Status: DC

## 2018-10-29 MED ORDER — PREDNISONE 10 MG PO TABS: ORAL_TABLET | ORAL | 0 refills | Status: DC

## 2018-10-29 NOTE — Patient Instructions (Signed)
You do have history of various arthralgia, musculoskeletal pains and some atypical chest pain in the past.  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  Unfortunate I do not have the report but they thought that might have been because of severe neck symptoms in the past.  Since you do have some radicular pain toward your right shoulder/right bicep region at times, I do think is a good idea to give you a 6-day taper dose of prednisone and Flexeril.  We will see if your symptoms improve with this treatment but I also want you to go ahead and contact orthopedic clinic again and let them know your recent symptoms.  I think office visit with them would be beneficial.  Asked that they fax [place] over office note as well as prior MRI report.  You do mention history of various arthralgias some in the left elbow and the wrist area.  Conservative treatment I advised early in the spring did not help.  With these pains as well as thoracic spine region pain, I do think is a good idea to go ahead and get arthritis panel studies.  When those results are back we will assess your response to prednisone and likely refer you to rheumatologist.  Discussed briefly today that fibromyalgia is a consideration but want you to be evaluated by rheumatology first.  Atypical chest pain in the past and evaluated by cardiologist.  Cardiologist very unlikely that your symptoms were heart related.  Still recommend continue with aspirin as he advised and if you have any significant chest pain then be evaluated in the emergency department particularly if we are not available.  Follow-up date to be determined after lab review and review of orthopedist note.

## 2018-10-29 NOTE — Progress Notes (Signed)
Subjective:    Patient ID: Lisa Ellis, female    DOB: 1975/08/25, 43 y.o.   MRN: [id number]  HPI  Virtual Visit via Video Note  I connected with Lisa Ellis on 10/29/18 at  2:00 PM EDT by a video enabled telemedicine application and verified that I am speaking with the correct person using two identifiers.  Location: Patient: work Provider: home   I discussed the limitations of evaluation and management by telemedicine and the availability of in person appointments. The patient expressed understanding and agreed to proceed.  Pt did not check bp today. She will check bp and pulse then my chart me  History of Present Illness:  Pt has been working a lot recently. Maybe hiring helper.  She has some hx of neck pain that radiates to her rt shoulder down to rt bicep area at times.. She is pointing to her rt trapezius and feels tight.. At times with this she gets atypical chest pain. Sometimes in the past she states sternum junction pain with rainy weather. Some recently but none presently. She states happened late last week and weekend.  Pt in past pt seen by sports med for t spine pain.Marland Kitchen Also in past she saw ortho [place]. For neck pain. They thought she had impinged nerve. Pt states they did mri  Pt has seen cardiologist and he thought.    1. Atypical chest pain: Not related to her heart.  Stress test negative. 2. Dyslipidemia did not reach the point of initiation of any therapy.  We will continue monitoring we talked about diet and exercises which she will try to do if her back will get better 3. Upper back pain.  Will refer back to primary care physician.  May be MRI of the spine and rheumatological work-up need to be done.  I see her back in my office in 3 months to see how she does.  Pt also has had other arthralagia/msk pain particular left upper ext.     Observations/Objective:  General-no acute distress, pleasant, oriented. Lungs- on inspection lungs appear unlabored.  Neck- no tracheal deviation or jvd on inspection. Neuro- gross motor function appears intact.   Assessment and Plan: You do have history of various arthralgia, musculoskeletal pains and some atypical chest pain in the past.  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  Unfortunate I do not have the report but they thought that might have been because of severe neck symptoms in the past.  Since you do have some radicular pain toward your right shoulder/right bicep region at times, I do think is a good idea to give you a 6-day taper dose of prednisone and Flexeril.  We will see if your symptoms improve with this treatment but I also want you to go ahead and contact orthopedic clinic again and let them know your recent symptoms.  I think office visit with them would be beneficial.  Asked that they fax [place] over office note as well as prior MRI report.  You do mention history of various arthralgias some in the left elbow and the wrist area.  Conservative treatment I advised early in the spring did not help.  With these pains as well as thoracic spine region pain, I do think is a good idea to go ahead and get arthritis panel studies.  When those results are back we will assess your response to prednisone and likely refer you to  rheumatologist.  Discussed briefly today that fibromyalgia is a consideration but want you to be evaluated by rheumatology first.  Atypical chest pain in the past and evaluated by cardiologist.  Cardiologist very unlikely that your symptoms were heart related.  Still recommend continue with aspirin as he advised and if you have any significant chest pain then be evaluated in the emergency department particularly if we are not available.  Follow-up date to be determined after lab review and review of orthopedist note.  [name], PA-C  Follow Up Instructions:    I discussed the assessment and treatment  plan with the patient. The patient was provided an opportunity to ask questions and all were answered. The patient agreed with the plan and demonstrated an understanding of the instructions.   The patient was advised to call back or seek an in-person evaluation if the symptoms worsen or if the condition fails to improve as anticipated.  I provided 25+ minutes of non-face-to-face time during this encounter.   [name], PA-C    Review of Systems     Objective:   Physical Exam        Assessment & Plan:

## 2018-10-29 NOTE — Telephone Encounter (Signed)
Pt scheduled for 2:00 today.

## 2018-10-30 ENCOUNTER — Telehealth: Payer: Self-pay | Admitting: Medical

## 2018-10-30 NOTE — Telephone Encounter (Signed)
Called to schedule lab visit. No answer and voicemail full.

## 2018-11-04 ENCOUNTER — Other Ambulatory Visit (INDEPENDENT_AMBULATORY_CARE_PROVIDER_SITE_OTHER): Payer: BC Managed Care – PPO

## 2018-11-04 ENCOUNTER — Other Ambulatory Visit: Payer: Self-pay

## 2018-11-04 DIAGNOSIS — M2550 Pain in unspecified joint: Secondary | ICD-10-CM | POA: Diagnosis not present

## 2018-11-04 LAB — SEDIMENTATION RATE: Sed Rate: 6 mm/hr (ref 0–20)

## 2018-11-04 LAB — C-REACTIVE PROTEIN: CRP: <1 mg/dL (ref 0.5–20.0)

## 2018-11-05 ENCOUNTER — Encounter: Payer: Self-pay | Admitting: Medical

## 2018-11-05 ENCOUNTER — Telehealth: Payer: Self-pay | Admitting: Medical

## 2018-11-05 DIAGNOSIS — M2550 Pain in unspecified joint: Secondary | ICD-10-CM

## 2018-11-05 NOTE — Telephone Encounter (Signed)
Referral to rheumalogist placed.

## 2018-11-06 LAB — ANA: Anti Nuclear Antibody (ANA): NEGATIVE

## 2018-11-06 LAB — HLA-B27 ANTIGEN: HLA-B27 Antigen: NEGATIVE

## 2018-11-06 LAB — RHEUMATOID FACTOR: Rhuematoid fact SerPl-aCnc: <14 IU/mL (ref <14)

## 2018-12-16 DIAGNOSIS — M546 Pain in thoracic spine: Secondary | ICD-10-CM | POA: Diagnosis not present

## 2018-12-16 DIAGNOSIS — R0789 Other chest pain: Secondary | ICD-10-CM | POA: Diagnosis not present

## 2018-12-16 DIAGNOSIS — M940 Chondrocostal junction syndrome [Tietze]: Secondary | ICD-10-CM | POA: Diagnosis not present

## 2018-12-16 DIAGNOSIS — G8929 Other chronic pain: Secondary | ICD-10-CM | POA: Diagnosis not present

## 2018-12-29 ENCOUNTER — Emergency Department (HOSPITAL_COMMUNITY): Payer: BC Managed Care – PPO

## 2018-12-29 ENCOUNTER — Other Ambulatory Visit: Payer: Self-pay

## 2018-12-29 ENCOUNTER — Inpatient Hospital Stay
Admission: RE | Admit: 2018-12-29 | Discharge: 2018-12-29 | Disposition: A | Discharge status: Home / Self Care | Payer: BC Managed Care – PPO | Source: Ambulatory Visit

## 2018-12-29 ENCOUNTER — Emergency Department (HOSPITAL_COMMUNITY)
Admission: EM | Admit: 2018-12-29 | Discharge: 2018-12-29 | Disposition: A | Discharge status: Home / Self Care | Payer: BC Managed Care – PPO | Attending: Emergency Medicine | Admitting: Emergency Medicine

## 2018-12-29 ENCOUNTER — Encounter (HOSPITAL_COMMUNITY): Payer: Self-pay | Admitting: Emergency Medicine

## 2018-12-29 DIAGNOSIS — R079 Chest pain, unspecified: Secondary | ICD-10-CM | POA: Diagnosis not present

## 2018-12-29 DIAGNOSIS — Z5321 Procedure and treatment not carried out due to patient leaving prior to being seen by health care provider: Secondary | ICD-10-CM | POA: Diagnosis not present

## 2018-12-29 LAB — CBC
HCT: 47.1 % — ABNORMAL HIGH (ref 36.0–46.0)
Hemoglobin: 15.5 g/dL — ABNORMAL HIGH (ref 12.0–15.0)
MCH: 30.3 pg (ref 26.0–34.0)
MCHC: 32.9 g/dL (ref 30.0–36.0)
MCV: 92 fL (ref 80.0–100.0)
Platelets: 297 10*3/uL (ref 150–400)
RBC: 5.12 MIL/uL — ABNORMAL HIGH (ref 3.87–5.11)
RDW: 12.1 % (ref 11.5–15.5)
WBC: 10.5 10*3/uL (ref 4.0–10.5)
nRBC: 0 % (ref 0.0–0.2)

## 2018-12-29 LAB — BASIC METABOLIC PANEL
Anion gap: 14 (ref 5–15)
BUN: 13 mg/dL (ref 6–20)
CO2: 20 mmol/L — ABNORMAL LOW (ref 22–32)
Calcium: 9.3 mg/dL (ref 8.9–10.3)
Chloride: 103 mmol/L (ref 98–111)
Creatinine, Ser: 0.74 mg/dL (ref 0.44–1.00)
GFR calc Af Amer: >60 mL/min (ref >60)
GFR calc non Af Amer: >60 mL/min (ref >60)
Glucose, Bld: 106 mg/dL — ABNORMAL HIGH (ref 70–99)
Potassium: 4.1 mmol/L (ref 3.5–5.1)
Sodium: 137 mmol/L (ref 135–145)

## 2018-12-29 LAB — TROPONIN I (HIGH SENSITIVITY): Troponin I (High Sensitivity): 3 ng/L (ref <18)

## 2018-12-29 LAB — I-STAT BETA HCG BLOOD, ED (MC, WL, AP ONLY): I-stat hCG, quantitative: <5 m[IU]/mL (ref <5)

## 2018-12-29 MED ORDER — SODIUM CHLORIDE 0.9% FLUSH: 3.0000 mL | Freq: Once | INTRAVENOUS | Status: DC

## 2018-12-29 NOTE — ED Triage Notes (Signed)
Pt reports not feeling well today, had dizziness and CP. CP started in mid chest and radiated to jaws and head.

## 2018-12-29 NOTE — ED Notes (Signed)
Pt states she is leaving due to wait.  Explained triage process and encouraged pt to stay.  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  Explained options to get results.  Pt declined to stay.

## 2019-01-14 DIAGNOSIS — M940 Chondrocostal junction syndrome [Tietze]: Secondary | ICD-10-CM | POA: Diagnosis not present

## 2019-01-14 DIAGNOSIS — R0789 Other chest pain: Secondary | ICD-10-CM | POA: Diagnosis not present

## 2019-01-14 DIAGNOSIS — G8929 Other chronic pain: Secondary | ICD-10-CM | POA: Diagnosis not present

## 2019-01-14 DIAGNOSIS — M546 Pain in thoracic spine: Secondary | ICD-10-CM | POA: Diagnosis not present

## 2019-01-16 ENCOUNTER — Telehealth: Payer: BC Managed Care – PPO | Admitting: Physician Assistant

## 2019-01-16 DIAGNOSIS — J018 Other acute sinusitis: Secondary | ICD-10-CM | POA: Diagnosis not present

## 2019-01-16 MED ORDER — DOXYCYCLINE HYCLATE 100 MG PO TABS: 100.0000 mg | ORAL_TABLET | Freq: Two times a day (BID) | ORAL | 0 refills | Status: DC

## 2019-01-16 NOTE — Progress Notes (Signed)
I have spent more than 5 but less than 10 min on this E-Visit  We are sorry that you are not feeling well.  Here is how we plan to help!  Based on what you have shared with me it looks like you have sinusitis.  Sinusitis is inflammation and infection in the sinus cavities of the head.  Based on your presentation I believe you most likely have Acute Bacterial Sinusitis.  This is an infection caused by bacteria and is treated with antibiotics. I have prescribed Doxycycline 100mg  by mouth twice a day for 10 days. You may use an oral decongestant such as Mucinex D or if you have glaucoma or high blood pressure use plain Mucinex. Saline nasal spray help and can safely be used as often as needed for congestion. Please increase fluids.   If you develop worsening sinus pain, fever or notice severe headache and vision changes, or if symptoms are not better after completion of antibiotic, please schedule an appointment with a health care provider.    Sinus infections are not as easily transmitted as other respiratory infection, however we still recommend that you avoid close contact with loved ones, especially the very young and elderly.  Remember to wash your hands thoroughly throughout the day as this is the number one way to prevent the spread of infection!  Home Care:  Only take medications as instructed by your medical team.  Complete the entire course of an antibiotic.  Do not take these medications with alcohol.  A steam or ultrasonic humidifier can help congestion.  You can place a towel over your head and breathe in the steam from hot water coming from a faucet.  Avoid close contacts especially the very young and the elderly.  Cover your mouth when you cough or sneeze.  Always remember to wash your hands.  Get Help Right Away If:  You develop worsening fever or sinus pain.  You develop a severe head ache or visual changes.  Your symptoms persist after you have completed your treatment  plan.  Make sure you  Understand these instructions.  Will watch your condition.  Will get help right away if you are not doing well or get worse.  Your e-visit answers were reviewed by a board certified advanced clinical practitioner to complete your personal care plan.  Depending on the condition, your plan could have included both over the counter or prescription medications.  If there is a problem please reply  once you have received a response from your provider.  Your safety is important to [place].  If you have drug allergies check your prescription carefully.    You can use MyChart to ask questions about today's visit, request a non-urgent call back, or ask for a work or school excuse for 24 hours related to this e-Visit. If it has been greater than 24 hours you will need to follow up with your provider, or enter a new e-Visit to address those concerns.  You will get an e-mail in the next two days asking about your experience.  I hope that your e-visit has been valuable and will speed your recovery. Thank you for using e-visits.

## 2019-01-16 NOTE — Addendum Note (Signed)
Addended by: Margarita Mail on: 01/16/2019 06:17 PM   Modules accepted: Orders

## 2019-03-25 ENCOUNTER — Other Ambulatory Visit (HOSPITAL_BASED_OUTPATIENT_CLINIC_OR_DEPARTMENT_OTHER): Payer: Self-pay | Admitting: Obstetrics and Gynecology

## 2019-03-25 DIAGNOSIS — Z1231 Encounter for screening mammogram for malignant neoplasm of breast: Secondary | ICD-10-CM

## 2019-03-31 ENCOUNTER — Ambulatory Visit (HOSPITAL_BASED_OUTPATIENT_CLINIC_OR_DEPARTMENT_OTHER)
Admission: RE | Admit: 2019-03-31 | Discharge: 2019-03-31 | Disposition: A | Discharge status: Home / Self Care | Payer: BC Managed Care – PPO | Source: Ambulatory Visit | Attending: Obstetrics and Gynecology | Admitting: Obstetrics and Gynecology

## 2019-03-31 ENCOUNTER — Other Ambulatory Visit: Payer: Self-pay

## 2019-03-31 ENCOUNTER — Encounter (HOSPITAL_BASED_OUTPATIENT_CLINIC_OR_DEPARTMENT_OTHER): Payer: Self-pay

## 2019-03-31 DIAGNOSIS — Z1231 Encounter for screening mammogram for malignant neoplasm of breast: Secondary | ICD-10-CM | POA: Diagnosis not present

## 2019-04-11 DIAGNOSIS — N905 Atrophy of vulva: Secondary | ICD-10-CM | POA: Diagnosis not present

## 2019-04-11 DIAGNOSIS — Z01419 Encounter for gynecological examination (general) (routine) without abnormal findings: Secondary | ICD-10-CM | POA: Diagnosis not present

## 2019-04-11 DIAGNOSIS — N952 Postmenopausal atrophic vaginitis: Secondary | ICD-10-CM | POA: Diagnosis not present

## 2019-05-01 DIAGNOSIS — L570 Actinic keratosis: Secondary | ICD-10-CM | POA: Diagnosis not present

## 2019-05-01 DIAGNOSIS — C44319 Basal cell carcinoma of skin of other parts of face: Secondary | ICD-10-CM | POA: Diagnosis not present

## 2019-05-01 DIAGNOSIS — L739 Follicular disorder, unspecified: Secondary | ICD-10-CM | POA: Diagnosis not present

## 2019-05-14 DIAGNOSIS — C44319 Basal cell carcinoma of skin of other parts of face: Secondary | ICD-10-CM | POA: Diagnosis not present

## 2019-05-20 DIAGNOSIS — J3489 Other specified disorders of nose and nasal sinuses: Secondary | ICD-10-CM | POA: Diagnosis not present

## 2019-05-20 DIAGNOSIS — J302 Other seasonal allergic rhinitis: Secondary | ICD-10-CM | POA: Diagnosis not present

## 2019-05-20 DIAGNOSIS — H6983 Other specified disorders of Eustachian tube, bilateral: Secondary | ICD-10-CM | POA: Diagnosis not present

## 2019-05-22 DIAGNOSIS — C44319 Basal cell carcinoma of skin of other parts of face: Secondary | ICD-10-CM | POA: Diagnosis not present

## 2019-05-28 ENCOUNTER — Other Ambulatory Visit: Payer: Self-pay

## 2019-05-28 ENCOUNTER — Ambulatory Visit (INDEPENDENT_AMBULATORY_CARE_PROVIDER_SITE_OTHER): Payer: BC Managed Care – PPO | Admitting: Medical

## 2019-05-28 VITALS — BP 115/86 | HR 77

## 2019-05-28 DIAGNOSIS — J309 Allergic rhinitis, unspecified: Secondary | ICD-10-CM

## 2019-05-28 MED ORDER — FLUTICASONE PROPIONATE 50 MCG/ACT NA SUSP: 2.0000 | Freq: Every day | NASAL | 1 refills | Status: DC

## 2019-05-28 MED ORDER — CYCLOBENZAPRINE HCL 10 MG PO TABS: 10.0000 mg | ORAL_TABLET | Freq: Every day | ORAL | 0 refills | Status: DC

## 2019-05-28 MED ORDER — MONTELUKAST SODIUM 10 MG PO TABS: 10.0000 mg | ORAL_TABLET | Freq: Every day | ORAL | 3 refills | Status: DC

## 2019-05-28 NOTE — Patient Instructions (Signed)
Recent allergic rhinitis. Continue allegra. Added  flonase and montelukast.  Neck pain/myalgia, rx flexeril to take at night before sleep. Update me by Friday if pain easing up. Might add nsaid if pain persists. Note no st presently and not meningitis like presently.  If any ear pain, sinus pain or st start augmentin [facility] rx'd.  Follow up by my chart Friday or as needed

## 2019-05-28 NOTE — Progress Notes (Signed)
   Subjective:    Patient ID: Lisa Ellis, female    DOB: November 22, 1975, 44 y.o.   MRN: [id number]  HPI  Virtual Visit via Video Note  I connected with Warda Yacko on 05/28/19 at 11:20 AM EDT by a video enabled telemedicine application and verified that I am speaking with the correct person using two identifiers.  Location: Patient: home Provider: office   I discussed the limitations of evaluation and management by telemedicine and the availability of in person appointments. The patient expressed understanding and agreed to proceed.  History of Present Illness:   2 weeks of itchy, nasal congestion and  runny nose. Pt states [facility] and they said sinus infection but her sinus did not hurt so did not take then antibiotic. Thjey gave her augmentin. She did not start.   She only has taken allegra. Helps her allergies some  Pain beneath ear and rt side sternocledomatoid area pain/neck area pain. No pain on swallowing.  Also some back of neck pain in occiiptal area on rt side.  No ha, fever, chills or stiff neck. No diffuse myalgias. No change in sense and smell.  No ear pain and clarifies no sore throat.     Observations/Objective:  General-no acute distress, pleasant, oriented. Lungs- on inspection lungs appear unlabored. Neck- no tracheal deviation or jvd on inspection. Good rom but describes/points to  both sternocleidomastoid muscles as source of painand rt side trapezius pain on neck movements. Neuro- gross motor function appears intact.  Assessment and Plan: Recent allergic rhinitis. Continue allegra. Added  flonase and montelukast.  Neck pain/myalgia, rx flexeril to take at night before sleep. Update me by Friday if pain easing up. Might add nsaid if pain persists. Note no st presently and not meningitis like presently.  If any ear pain, sinus pain or st start augmentin [facility] rx'd.  Follow up by my chart Friday or as needed  [employer], PA-C   20 minutes spent with  pt today.  Follow Up Instructions:    I discussed the assessment and treatment plan with the patient. The patient was provided an opportunity to ask questions and all were answered. The patient agreed with the plan and demonstrated an understanding of the instructions.   The patient was advised to call back or seek an in-person evaluation if the symptoms worsen or if the condition fails to improve as anticipated.  I provided 20 minutes of non-face-to-face time during this encounter.   [name], PA-C   Review of Systems     Objective:   Physical Exam        Assessment & Plan:

## 2019-05-30 ENCOUNTER — Encounter: Payer: Self-pay | Admitting: Medical

## 2019-07-07 ENCOUNTER — Telehealth: Payer: Self-pay | Admitting: Medical

## 2019-07-07 ENCOUNTER — Encounter: Payer: Self-pay | Admitting: Medical

## 2019-07-07 ENCOUNTER — Telehealth (INDEPENDENT_AMBULATORY_CARE_PROVIDER_SITE_OTHER): Payer: BC Managed Care – PPO | Admitting: Medical

## 2019-07-07 VITALS — BP 128/72 | HR 68

## 2019-07-07 DIAGNOSIS — M542 Cervicalgia: Secondary | ICD-10-CM | POA: Diagnosis not present

## 2019-07-07 DIAGNOSIS — R0789 Other chest pain: Secondary | ICD-10-CM | POA: Diagnosis not present

## 2019-07-07 DIAGNOSIS — R1013 Epigastric pain: Secondary | ICD-10-CM

## 2019-07-07 MED ORDER — METHOCARBAMOL 750 MG PO TABS: 750.0000 mg | ORAL_TABLET | Freq: Three times a day (TID) | ORAL | 0 refills | Status: DC

## 2019-07-07 NOTE — Progress Notes (Addendum)
Subjective:    Patient ID: Lisa Ellis, female    DOB: [birth year], 44 y.o.   MRN: [id number]  HPI   Virtual Visit via Video Note  I connected with Lisa Ellis on 07/11/19 at  4:20 PM EDT by a video enabled telemedicine application and verified that I am speaking with the correct person using two identifiers.  Location: Patient: home Provider: office   I discussed the limitations of evaluation and management by telemedicine and the availability of in person appointments. The patient expressed understanding and agreed to proceed.     Follow Up Instructions:    I discussed the assessment and treatment plan with the patient. The patient was provided an opportunity to ask questions and all were answered. The patient agreed with the plan and demonstrated an understanding of the instructions.   The patient was advised to call back or seek an in-person evaluation if the symptoms worsen or if the condition fails to improve as anticipated.     [name], PA-C  Pt in for evaluation.  She had some recent pain on back of her rt side neck/near occipital  Area yesterday. She states felt muscle spasm. States if turn her head to rt will have sharp pain. No pain radiating to her arm  Pt states also  yesterday watching  with her dogs play. She had brief pain in epigastric area then radiated up her chest to teeth. No belch at that time but overall belching more.  Pt did takes tums yesterday. She described this burning sensation starting from epigastric area.  Then last night she had some transient pain in rt shoulder with neck pain last night. Hx of bulging disc on neck mri in the past.    Pt prior cardiac work up have been negative in the past.   Last night had some pain from neck radiating to rt shoulder. Now no pain presently.    Review of Systems  Constitutional: Negative for chills, fatigue and fever.  Respiratory: Negative for cough, chest tightness, shortness of breath and  wheezing.   Cardiovascular: Negative for chest pain and palpitations.  Gastrointestinal: Positive for abdominal pain. Negative for abdominal distention, constipation, diarrhea, nausea and vomiting.  Musculoskeletal: Positive for neck pain. Negative for neck stiffness.  Skin: Negative for rash.  Neurological: Negative for dizziness, weakness, numbness and headaches.  Hematological: Negative for adenopathy. Does not bruise/bleed easily.  Psychiatric/Behavioral: Negative for behavioral problems and confusion. The patient is not nervous/anxious.     Past Medical History:  Diagnosis Date  . Allergy   . Asthma    Only wheezed on time associated with pneumonia. none since.  . Depression    only had brief episode in 2006 for couple of months when dad passed away.     Social History   Socioeconomic History  . Marital status: Married    Spouse name: Not on file  . Number of children: Not on file  . Years of education: Not on file  . Highest education level: Not on file  Occupational History  . Not on file  Tobacco Use  . Smoking status: Never Smoker  . Smokeless tobacco: Never Used  Substance and Sexual Activity  . Alcohol use: No    Alcohol/week: 0.0 standard drinks  . Drug use: No  . Sexual activity: Yes  Other Topics Concern  . Not on file  Social History Narrative  . Not on file   Social Determinants of Health   Financial Resource Strain:   .  Difficulty of Paying Living Expenses:   Food Insecurity:   . Worried About Charity fundraiser in the Last Year:   . Arboriculturist in the Last Year:   Transportation Needs:   . Film/video editor (Medical):   Marland Kitchen Lack of Transportation (Non-Medical):   Physical Activity:   . Days of Exercise per Week:   . Minutes of Exercise per Session:   Stress:   . Feeling of Stress :   Social Connections:   . Frequency of Communication with Friends and Family:   . Frequency of Social Gatherings with Friends and Family:   . Attends  Religious Services:   . Active Member of Clubs or Organizations:   . Attends Archivist Meetings:   Marland Kitchen Marital Status:   Intimate Partner Violence:   . Fear of Current or Ex-Partner:   . Emotionally Abused:   Marland Kitchen Physically Abused:   . Sexually Abused:     Past Surgical History:  Procedure Laterality Date  . AUGMENTATION MAMMAPLASTY    . BREAST SURGERY     augmentation procedure.  . CHOLECYSTECTOMY    . NASAL SINUS SURGERY      Family History  Problem Relation Age of Onset  . Hypertension Mother   . Diabetes Father   . Hypertension Maternal Grandfather   . Diabetes Paternal Grandmother     No Known Allergies  Current Outpatient Medications on File Prior to Visit  Medication Sig Dispense Refill  . aspirin EC 81 MG tablet Take 1 tablet (81 mg total) by mouth daily. 90 tablet 3  . cyclobenzaprine (FLEXERIL) 10 MG tablet Take 1 tablet (10 mg total) by mouth at bedtime. 30 tablet 0  . cyclobenzaprine (FLEXERIL) 10 MG tablet Take 1 tablet (10 mg total) by mouth at bedtime. 10 tablet 0  . doxycycline (VIBRA-TABS) 100 MG tablet Take 1 tablet (100 mg total) by mouth 2 (two) times daily. 14 tablet 0  . fluticasone (FLONASE) 50 MCG/ACT nasal spray Place 2 sprays into both nostrils daily. 16 g 1  . montelukast (SINGULAIR) 10 MG tablet Take 1 tablet (10 mg total) by mouth at bedtime. 30 tablet 3  . omeprazole (PRILOSEC) 40 MG capsule Take one cap PO once daily 20 minutes before evening meal 15 capsule 1  . ondansetron (ZOFRAN) 4 MG tablet Take 1 tablet (4 mg total) by mouth every 8 (eight) hours as needed. 20 tablet 0  . predniSONE (DELTASONE) 10 MG tablet Taper over 6 days 21 tablet 0   Current Facility-Administered Medications on File Prior to Visit  Medication Dose Route Frequency Provider Last Rate Last Admin  . 0.9 %  sodium chloride infusion   Intravenous Once Jaynee Eagles, PA-C        There were no vitals taken for this visit.      Objective:   Physical  Exam  General-no acute distress, pleasant, oriented. Lungs- on inspection lungs appear unlabored. Neck- no tracheal deviation or jvd on inspection. Neuro- gross motor function appears intact.      Assessment & Plan:  You have recent neck pain rt side. Will rx robaxin for trapezius muscle strain and see if this helps.  For recent abdomen pain with burning advise start back on omeprazole. Eat healthy. I do want to be cautious and have you get scheduled for ekg tomorrow with troponin stat. You had virtual visit with some Gi and atypical brief chest pain. For caution sake will get ekg and lab tomorrow.  If you worsen during interim then advise  ED evaluation.  Some pain radiating to shoulder with hx of cercvical disc bulge so consider [place] of gabapentin. Will see how responds to robaxin first.  Follow up tomorrow as states above.   Asking pt to call back to get scheduled for ekg and lab.  Time spent with patient today was 25  minutes which consisted of chart review, discussing diagnosis, work up treatment and documentation.

## 2019-07-07 NOTE — Telephone Encounter (Signed)
Will you get pt scheduled for ekg on 07/08/2019. I saw her today. Prefer tomorrow. Also want to get stat troponin tomorrow as she is leaving out of town on Wednesday.

## 2019-07-07 NOTE — Patient Instructions (Addendum)
You have recent neck pain rt side. Will rx robaxin for trapezius muscle strain and see if this helps.  For recent abdomen pain with burning advise start back on omeprazole. Eat healthy. I do want to be cautious and have you get scheduled for ekg tomorrow with troponin stat. You had virtual visit with some Gi and atypical brief chest pain. For caution sake will get ekg and lab tomorrow. If you worsen during interim then advise  ED evaluation.  Some pain radiating to shoulder with hx of cercvical disc bulge so consider [place] of gabapentin. Will see how responds to robaxin first.  Follow up tomorrow as states above.   Asking pt to call back to get scheduled for ekg and lab.  ekg today 07/08/2019. Showed normal sinus rhythm.

## 2019-07-07 NOTE — Telephone Encounter (Signed)
Placed future orders

## 2019-07-08 ENCOUNTER — Other Ambulatory Visit: Payer: Self-pay

## 2019-07-08 ENCOUNTER — Other Ambulatory Visit: Payer: Self-pay | Admitting: Medical

## 2019-07-08 ENCOUNTER — Other Ambulatory Visit (INDEPENDENT_AMBULATORY_CARE_PROVIDER_SITE_OTHER): Payer: BC Managed Care – PPO

## 2019-07-08 DIAGNOSIS — M542 Cervicalgia: Secondary | ICD-10-CM | POA: Diagnosis not present

## 2019-07-08 DIAGNOSIS — R0789 Other chest pain: Secondary | ICD-10-CM

## 2019-07-08 LAB — TROPONIN I (HIGH SENSITIVITY): High Sens Troponin I: 3 ng/L (ref 2–17)

## 2019-07-08 NOTE — Telephone Encounter (Signed)
Pt is coming in today at 1:45

## 2019-07-08 NOTE — Addendum Note (Signed)
Addended by: Jeronimo Greaves on: 07/08/2019 01:52 PM   Modules accepted: Orders

## 2019-07-11 ENCOUNTER — Other Ambulatory Visit: Payer: Self-pay | Admitting: Medical

## 2019-07-13 NOTE — Telephone Encounter (Signed)
Refilled robaxin rx.

## 2019-07-14 ENCOUNTER — Encounter: Payer: Self-pay | Admitting: Medical

## 2019-07-17 ENCOUNTER — Encounter: Payer: Self-pay | Admitting: Medical

## 2019-07-17 ENCOUNTER — Ambulatory Visit: Payer: BC Managed Care – PPO | Admitting: Medical

## 2019-07-17 ENCOUNTER — Other Ambulatory Visit: Payer: Self-pay

## 2019-07-17 VITALS — BP 138/76 | HR 84 | Temp 96.7°F | Resp 18 | Ht 67.0 in | Wt 240.0 lb

## 2019-07-17 DIAGNOSIS — H9201 Otalgia, right ear: Secondary | ICD-10-CM

## 2019-07-17 DIAGNOSIS — G4459 Other complicated headache syndrome: Secondary | ICD-10-CM

## 2019-07-17 DIAGNOSIS — R221 Localized swelling, mass and lump, neck: Secondary | ICD-10-CM

## 2019-07-17 DIAGNOSIS — H814 Vertigo of central origin: Secondary | ICD-10-CM

## 2019-07-17 DIAGNOSIS — M542 Cervicalgia: Secondary | ICD-10-CM

## 2019-07-17 MED ORDER — KETOROLAC TROMETHAMINE 60 MG/2ML IM SOLN
60.0000 mg | Freq: Once | INTRAMUSCULAR | Status: AC
  Administered 2019-07-17: 60 mg via INTRAMUSCULAR

## 2019-07-17 NOTE — Patient Instructions (Signed)
You had a recent neck pain for the past 6 weeks and on exam today you do have some asymmetry on inspection.  Right side appears to be slightly more bulge compared to the left.  So I do think getting ultrasound soft tissue neck would be helpful.  Please go downstairs to get that scheduled.  You do report some daily headaches 7 days for the past 6 weeks.  1 event of dizziness in the past week.  On review of your family history/dad's brainstem tumor, I do think it would be reasonable to try to get CT of head without contrast.  Put this in his routine and will need to get prior authorization from insurance.  If during the interim you have any recurrent severe headache with associated symptoms then advise ED evaluation.  You do have some right ear pain over the past weeks and on exam you have slight pinkish-red TM.  You have a prescription of Augmentin at home and I want you to go ahead and start that.  History of allergies with ear pressure so would recommend that you use your steroid nasal spray.  We will follow back up ultrasound results.  You do have history of bulging disc in the neck so if ultrasound negative then consider getting opinion from specialist to see if disc might be causing symptoms.  Toradol 60 mg IM today in office.  Follow-up in 7 to 10 days or as needed.

## 2019-07-17 NOTE — Progress Notes (Signed)
Subjective:    Patient ID: Lisa Ellis, female    DOB: 10/20/1975, 44 y.o.   MRN: [id number]  HPI  Pt has some persisting rt side neck pain and also has rt ear pain.  Pt in past for neck pain has seen chiropracter and has seen dentist. States hurts to turn her head. When turns her head ear pain increased.   Some occipital head associated with rt side neck pain. Usually head ache  just once a day late afternoon. One day this week sleeping woke up as turned neck during sleep and noticed neck pain with  ha. One day this past week had dizziness. Ha have been mild. Pt has concern about mild ha since her dad passed away from brain strem tumor.  Dentist did xray and showed no tmj. No tmj type symptoms.  Last note hpi. She had some recent pain on back of her rt side neck/near occipital  Area yesterday. She states felt muscle spasm. States if turn her head to rt will have sharp pain. No pain radiating to her arm  Pt states also  yesterday watching  with her dogs play. She had brief pain in epigastric area then radiated up her chest to teeth. No belch at that time but overall belching more.  Pt did takes tums yesterday. She described this burning sensation starting from epigastric area.  Then last night she had some transient pain in rt shoulder with neck pain last night. Hx of bulging disc on neck mri in the past.     Last night had some pain from neck radiating to rt shoulder. Now no pain presently.    Pt states robaxin did not help recently. Pt does had have hx of cervical spine disc. Bulge.     Review of Systems  Constitutional: Negative for chills, diaphoresis, fatigue and fever.  HENT: Positive for ear pain.        See hpi.  Respiratory: Negative for cough, chest tightness, shortness of breath and wheezing.   Cardiovascular: Negative for chest pain and palpitations.  Gastrointestinal: Negative for abdominal pain.  Musculoskeletal: Positive for neck pain.  Skin: Negative for  rash.  Neurological: Negative for dizziness and headaches.       None present but see hpi.  Psychiatric/Behavioral: Negative for behavioral problems, decreased concentration and suicidal ideas. The patient is not nervous/anxious.        Objective:   Physical Exam  General Mental Status- Alert. General Appearance- Not in acute distress.   Skin General: Color- Normal Color. Moisture- Normal Moisture.  Neck On inspection faint mild side rt neck swelling. Appears over mid rt sternocleidomastoid area. Pain on neck movement. No mid Cervical spine tenderness.   Chest and Lung Exam Auscultation: Breath Sounds:-Normal.  Cardiovascular Auscultation:Rythm- Regular. Murmurs & Other Heart Sounds:Auscultation of the heart reveals- No Murmurs.  Abdomen Inspection:-Inspeection Normal. Palpation/Percussion:Note:No mass. Palpation and Percussion of the abdomen reveal- Non Tender, Non Distended + BS, no rebound or guarding.    Neurologic Cranial Nerve exam:- CN III-XII intact(No nystagmus), symmetric smile. Normal/IntactStrength:- 5/5 equal and symmetric strength both upper and lower extremities.  heent- mild reddish pink rt tm.      Assessment & Plan:  You had a recent neck pain for the past 6 weeks and on exam today you do have some asymmetry on inspection.  Right side appears to be slightly more bulge compared to the left.  So I do think getting ultrasound soft tissue neck would be helpful.  Please  go downstairs to get that scheduled.  You do report some daily headaches 7 days for the past 6 weeks.  1 event of dizziness in the past week.  On review of your family history/dad's brainstem tumor, I do think it would be reasonable to try to get CT of head without contrast.  Put this in his routine and will need to get prior authorization from insurance.  If during the interim you have any recurrent severe headache with associated symptoms then advise ED evaluation.  You do have some right  ear pain over the past weeks and on exam you have slight pinkish-red TM.  You have a prescription of Augmentin at home and I want you to go ahead and start that.  History of allergies with ear pressure so would recommend that you use your steroid nasal spray.  We will follow back up ultrasound results.  You do have history of bulging disc in the neck so if ultrasound negative then consider getting opinion from specialist to see if disc might be causing symptoms.  Toradol 60 mg IM today in office.  Follow-up in 7 to 10 days or as needed.  [name], PA-C   Time spent with patient today was  30 minutes which consisted of chart rediew, discussing diagnosis, work up, treatment and documentation.

## 2019-07-30 ENCOUNTER — Ambulatory Visit (HOSPITAL_BASED_OUTPATIENT_CLINIC_OR_DEPARTMENT_OTHER)
Admission: RE | Admit: 2019-07-30 | Discharge: 2019-07-30 | Disposition: A | Discharge status: Home / Self Care | Payer: BC Managed Care – PPO | Source: Ambulatory Visit | Attending: Medical | Admitting: Medical

## 2019-07-30 ENCOUNTER — Other Ambulatory Visit: Payer: Self-pay

## 2019-07-30 DIAGNOSIS — H814 Vertigo of central origin: Secondary | ICD-10-CM | POA: Diagnosis not present

## 2019-07-30 DIAGNOSIS — M542 Cervicalgia: Secondary | ICD-10-CM | POA: Insufficient documentation

## 2019-07-30 DIAGNOSIS — G4459 Other complicated headache syndrome: Secondary | ICD-10-CM

## 2019-07-30 DIAGNOSIS — R221 Localized swelling, mass and lump, neck: Secondary | ICD-10-CM | POA: Diagnosis not present

## 2019-07-30 DIAGNOSIS — R519 Headache, unspecified: Secondary | ICD-10-CM | POA: Diagnosis not present

## 2019-08-04 ENCOUNTER — Other Ambulatory Visit: Payer: Self-pay | Admitting: Medical

## 2019-09-02 DIAGNOSIS — L821 Other seborrheic keratosis: Secondary | ICD-10-CM | POA: Diagnosis not present

## 2019-09-02 DIAGNOSIS — L739 Follicular disorder, unspecified: Secondary | ICD-10-CM | POA: Diagnosis not present

## 2019-11-13 ENCOUNTER — Encounter: Payer: Self-pay | Admitting: Medical

## 2019-11-15 ENCOUNTER — Telehealth: Payer: Self-pay | Admitting: Medical

## 2019-11-15 DIAGNOSIS — Z1152 Encounter for screening for COVID-19: Secondary | ICD-10-CM

## 2019-11-15 NOTE — Telephone Encounter (Signed)
Future covid igg placed. At pt request.

## 2019-11-27 DIAGNOSIS — Z20822 Contact with and (suspected) exposure to covid-19: Secondary | ICD-10-CM | POA: Diagnosis not present

## 2019-11-27 DIAGNOSIS — R0981 Nasal congestion: Secondary | ICD-10-CM | POA: Diagnosis not present

## 2019-12-02 ENCOUNTER — Encounter (HOSPITAL_BASED_OUTPATIENT_CLINIC_OR_DEPARTMENT_OTHER): Payer: Self-pay | Admitting: *Deleted

## 2019-12-02 ENCOUNTER — Other Ambulatory Visit: Payer: Self-pay

## 2019-12-02 ENCOUNTER — Emergency Department (HOSPITAL_BASED_OUTPATIENT_CLINIC_OR_DEPARTMENT_OTHER)
Admission: EM | Admit: 2019-12-02 | Discharge: 2019-12-03 | Disposition: A | Discharge status: Home / Self Care | Payer: BC Managed Care – PPO | Attending: Emergency Medicine | Admitting: Emergency Medicine

## 2019-12-02 ENCOUNTER — Telehealth (INDEPENDENT_AMBULATORY_CARE_PROVIDER_SITE_OTHER): Payer: BC Managed Care – PPO | Admitting: Family

## 2019-12-02 ENCOUNTER — Emergency Department (HOSPITAL_BASED_OUTPATIENT_CLINIC_OR_DEPARTMENT_OTHER): Payer: BC Managed Care – PPO

## 2019-12-02 DIAGNOSIS — Z7982 Long term (current) use of aspirin: Secondary | ICD-10-CM | POA: Insufficient documentation

## 2019-12-02 DIAGNOSIS — Z7951 Long term (current) use of inhaled steroids: Secondary | ICD-10-CM | POA: Insufficient documentation

## 2019-12-02 DIAGNOSIS — J1289 Other viral pneumonia: Secondary | ICD-10-CM | POA: Diagnosis not present

## 2019-12-02 DIAGNOSIS — J45909 Unspecified asthma, uncomplicated: Secondary | ICD-10-CM | POA: Diagnosis not present

## 2019-12-02 DIAGNOSIS — J1282 Pneumonia due to coronavirus disease 2019: Secondary | ICD-10-CM | POA: Diagnosis not present

## 2019-12-02 DIAGNOSIS — U071 COVID-19: Secondary | ICD-10-CM

## 2019-12-02 DIAGNOSIS — R1012 Left upper quadrant pain: Secondary | ICD-10-CM

## 2019-12-02 DIAGNOSIS — R109 Unspecified abdominal pain: Secondary | ICD-10-CM | POA: Diagnosis not present

## 2019-12-02 LAB — URINALYSIS, ROUTINE W REFLEX MICROSCOPIC
Glucose, UA: NEGATIVE mg/dL
Ketones, ur: 15 mg/dL — AB
Leukocytes,Ua: NEGATIVE
Nitrite: NEGATIVE
Protein, ur: 100 mg/dL — AB
Specific Gravity, Urine: 1.025 (ref 1.005–1.030)
pH: 5.5 (ref 5.0–8.0)

## 2019-12-02 LAB — CBC
HCT: 44.8 % (ref 36.0–46.0)
Hemoglobin: 15.2 g/dL — ABNORMAL HIGH (ref 12.0–15.0)
MCH: 30.3 pg (ref 26.0–34.0)
MCHC: 33.9 g/dL (ref 30.0–36.0)
MCV: 89.2 fL (ref 80.0–100.0)
Platelets: 153 10*3/uL (ref 150–400)
RBC: 5.02 MIL/uL (ref 3.87–5.11)
RDW: 11.9 % (ref 11.5–15.5)
WBC: 5 10*3/uL (ref 4.0–10.5)
nRBC: 0 % (ref 0.0–0.2)

## 2019-12-02 LAB — COMPREHENSIVE METABOLIC PANEL
ALT: 23 U/L (ref 0–44)
AST: 27 U/L (ref 15–41)
Albumin: 4 g/dL (ref 3.5–5.0)
Alkaline Phosphatase: 69 U/L (ref 38–126)
Anion gap: 11 (ref 5–15)
BUN: 12 mg/dL (ref 6–20)
CO2: 26 mmol/L (ref 22–32)
Calcium: 8.6 mg/dL — ABNORMAL LOW (ref 8.9–10.3)
Chloride: 98 mmol/L (ref 98–111)
Creatinine, Ser: 1.03 mg/dL — ABNORMAL HIGH (ref 0.44–1.00)
GFR calc Af Amer: >60 mL/min (ref >60)
GFR calc non Af Amer: >60 mL/min (ref >60)
Glucose, Bld: 114 mg/dL — ABNORMAL HIGH (ref 70–99)
Potassium: 3.5 mmol/L (ref 3.5–5.1)
Sodium: 135 mmol/L (ref 135–145)
Total Bilirubin: 0.6 mg/dL (ref 0.3–1.2)
Total Protein: 7.4 g/dL (ref 6.5–8.1)

## 2019-12-02 LAB — LIPASE, BLOOD: Lipase: 36 U/L (ref 11–51)

## 2019-12-02 LAB — URINALYSIS, MICROSCOPIC (REFLEX): RBC / HPF: >50 RBC/hpf (ref 0–5)

## 2019-12-02 LAB — PREGNANCY, URINE: Preg Test, Ur: NEGATIVE

## 2019-12-02 MED ORDER — ACETAMINOPHEN 325 MG PO TABS
650.0000 mg | ORAL_TABLET | Freq: Once | ORAL | Status: AC
  Administered 2019-12-03: 650 mg via ORAL
  Filled 2019-12-02: qty 2

## 2019-12-02 NOTE — Progress Notes (Signed)
Virtual Visit via Video Note  I connected with Lisa Ellis on 12/02/19 at  5:40 PM EDT by a video enabled telemedicine application and verified that I am speaking with the correct person using two identifiers.  Location: Patient: home Provider: office   I discussed the limitations of evaluation and management by telemedicine and the availability of in person appointments. The patient expressed understanding and agreed to proceed. Only the patient and myself were present for today's video call.   History of Present Illness:  Patient is a 44 yr old female who presents today with several concerns:  1) covid-19 infection. Husband became ill last Monday- tested positive for covid on 11/25/19.  She was feeling OK but got tested anyway on   Thursday 9/16.  She too tested positive for Covid.  Reports a "muscle spasm" across the top of her stomach. She has been coughing today and yesterday. Denies SOB.  + fever, Tmax 101.1 today.  + loss of taste and smell.  Tolerating water, crackers and gingerale. She tested positive on 11/27/19. She reports a persistent cough.  She is unvaccinated. Her husband is on his way now via EMS  2) Abdominal pain/nausea- She reports pain is located in the LUQ. Reports feeling like her abdomen is distended.   Pain is intermittent. Can be severe at times.   OK in the AM.  Laying down helps some.  Reports some pain under her left breast just below her rib cage.  + associated nausea without vomiting.  Reports + diarrhea, occurs after eating. + anorexia.       Observations/Objective:   Gen: Awake, alert, no acute distress Resp: Breathing is even and non-labored Psych: calm/pleasant demeanor Neuro: Alert and Oriented x 3, + facial symmetry, speech is clear.   Assessment and Plan:  Covid-19 infection- will refer for monoclonal antibody infusion.    Abdominal pain- I am concerned about her intermittent abdominal pain. She reports abdominal distention and + pain up under  her left ribs.  LLL PNA/PE are in the differential as well as an acute GI process such as diverticulitis or pancreatitis.  I advised pt to proceed to the ER for further evaluation.  She plans to go to the [place] ED. Report was given to Dr. Karle Starch on call for the ED.   Follow Up Instructions:    I discussed the assessment and treatment plan with the patient. The patient was provided an opportunity to ask questions and all were answered. The patient agreed with the plan and demonstrated an understanding of the instructions.   The patient was advised to call back or seek an in-person evaluation if the symptoms worsen or if the condition fails to improve as anticipated.  Nance Pear, NP

## 2019-12-02 NOTE — ED Triage Notes (Addendum)
Pt is Covid Positive. Abdominal pain x 2 days. Nausea. Diarrhea. No appetite. Her MD sent her to the ED for possible CT.

## 2019-12-02 NOTE — ED Provider Notes (Signed)
[facility] Provider Note: Georgena Spurling, MD, FACEP  CSN: [id number] MRN: [id number] ARRIVAL: 12/02/19 at [place]: [place]  Abdominal Pain   HISTORY OF PRESENT ILLNESS  12/02/19 11:36 PM Lisa Ellis is a 44 y.o. female who was diagnosed with Covid on 11/27/2019.  Her symptoms have included fever, body aches, cough and chest congestion.  She is here with abdominal pain in the left upper quadrant for the past 2 days.  She rates the pain as a 4 out of 10 and describes it as a spasm.  She is actually seen her abdominal wall moving in a spasm-like manner.  She has had no associated nausea, vomiting or diarrhea.  She is currently on her menses.  Her temperature on arrival was 100.8.  She denies chest pain.  She had a telemedicine visit with her PCP, Lemar Livings who advised she come to the ED for CT scan.   Past Medical History:  Diagnosis Date  . Allergy   . Asthma    Only wheezed on time associated with pneumonia. none since.  . Depression    only had brief episode in 2006 for couple of months when dad passed away.    Past Surgical History:  Procedure Laterality Date  . AUGMENTATION MAMMAPLASTY    . BREAST SURGERY     augmentation procedure.  . CHOLECYSTECTOMY    . NASAL SINUS SURGERY      Family History  Problem Relation Age of Onset  . Hypertension Mother   . Diabetes Father   . Hypertension Maternal Grandfather   . Diabetes Paternal Grandmother     Social History   Tobacco Use  . Smoking status: Never Smoker  . Smokeless tobacco: Never Used  Vaping Use  . Vaping Use: Never used  Substance Use Topics  . Alcohol use: No    Alcohol/week: 0.0 standard drinks  . Drug use: No    Prior to Admission medications   Medication Sig Start Date End Date Taking? Authorizing Provider  aspirin EC 81 MG tablet Take 1 tablet (81 mg total) by mouth daily. 06/15/17   Park Liter, MD  buPROPion (WELLBUTRIN XL) 150 MG 24 hr tablet  TAKE 1 TABLET(150 MG) BY MOUTH DAILY 07/08/19   Saguier, Percell Miller, PA-C  busPIRone (BUSPAR) 7.5 MG tablet TAKE 1 TABLET(7.5 MG) BY MOUTH TWICE DAILY 07/08/19   Saguier, Percell Miller, PA-C  cyclobenzaprine (FLEXERIL) 10 MG tablet TAKE 1 TABLET(10 MG) BY MOUTH AT BEDTIME 07/08/19   Saguier, Percell Miller, PA-C  fluticasone [facility]) 50 MCG/ACT nasal spray SHAKE LIQUID AND USE 2 SPRAYS IN [facility] NOSTRIL DAILY 08/04/19   Saguier, Percell Miller, PA-C  montelukast (SINGULAIR) 10 MG tablet Take 1 tablet (10 mg total) by mouth at bedtime. 05/28/19   Saguier, Percell Miller, PA-C  omeprazole (PRILOSEC) 40 MG capsule Take one cap PO once daily 20 minutes before evening meal 08/30/18   Kandra Nicolas, MD    Allergies Patient has no known allergies.   REVIEW OF SYSTEMS  Negative except as noted here or in the History of Present Illness.   PHYSICAL EXAMINATION  Initial Vital Signs Blood pressure 113/73, pulse 82, temperature (!) 100.8 F (38.2 C), temperature source Oral, resp. rate 20, height 5\' 7"  (1.702 m), weight 102.5 kg, last menstrual period 11/28/2019, SpO2 95 %.  Examination General: Well-developed, well-nourished female in no acute distress; appearance consistent with age of record HENT: normocephalic; atraumatic Eyes: pupils equal, round and reactive to light; extraocular muscles intact  Neck: supple Heart: regular rate and rhythm Lungs: clear to auscultation bilaterally; dry cough Abdomen: soft; nondistended; left upper quadrant tenderness but not up under the rib cage; no masses or hepatosplenomegaly; bowel sounds present Extremities: No deformity; full range of motion; pulses normal Neurologic: Awake, alert and oriented; motor function intact in all extremities and symmetric; no facial droop Skin: Warm and dry Psychiatric: Normal mood and affect   RESULTS  Summary of this visit's results, reviewed and interpreted by myself:   EKG Interpretation  Date/Time:    Ventricular Rate:    PR Interval:    QRS  Duration:   QT Interval:    QTC Calculation:   R Axis:     Text Interpretation:        Laboratory Studies: Results for orders placed or performed during the hospital encounter of 12/02/19 (from the past 24 hour(s))  Lipase, blood     Status: None   Collection Time: 12/02/19  6:46 PM  Result Value Ref Range   Lipase 36 11 - 51 U/L  Comprehensive metabolic panel     Status: Abnormal   Collection Time: 12/02/19  6:46 PM  Result Value Ref Range   Sodium 135 135 - 145 mmol/L   Potassium 3.5 3.5 - 5.1 mmol/L   Chloride 98 98 - 111 mmol/L   CO2 26 22 - 32 mmol/L   Glucose, Bld 114 (H) 70 - 99 mg/dL   BUN 12 6 - 20 mg/dL   Creatinine, Ser 1.03 (H) 0.44 - 1.00 mg/dL   Calcium 8.6 (L) 8.9 - 10.3 mg/dL   Total Protein 7.4 6.5 - 8.1 g/dL   Albumin 4.0 3.5 - 5.0 g/dL   AST 27 15 - 41 U/L   ALT 23 0 - 44 U/L   Alkaline Phosphatase 69 38 - 126 U/L   Total Bilirubin 0.6 0.3 - 1.2 mg/dL   GFR calc non Af Amer >60 >60 mL/min   GFR calc Af Amer >60 >60 mL/min   Anion gap 11 5 - 15  CBC     Status: Abnormal   Collection Time: 12/02/19  6:46 PM  Result Value Ref Range   WBC 5.0 4.0 - 10.5 K/uL   RBC 5.02 3.87 - 5.11 MIL/uL   Hemoglobin 15.2 (H) 12.0 - 15.0 g/dL   HCT 44.8 36 - 46 %   MCV 89.2 80.0 - 100.0 fL   MCH 30.3 26.0 - 34.0 pg   MCHC 33.9 30.0 - 36.0 g/dL   RDW 11.9 11.5 - 15.5 %   Platelets 153 150 - 400 K/uL   nRBC 0.0 0.0 - 0.2 %  Urinalysis, Routine w reflex microscopic Urine, Clean Catch     Status: Abnormal   Collection Time: 12/02/19  6:46 PM  Result Value Ref Range   Color, Urine YELLOW YELLOW   APPearance CLOUDY (A) CLEAR   Specific Gravity, Urine 1.025 1.005 - 1.030   pH 5.5 5.0 - 8.0   Glucose, UA NEGATIVE NEGATIVE mg/dL   Hgb urine dipstick LARGE (A) NEGATIVE   Bilirubin Urine SMALL (A) NEGATIVE   Ketones, ur 15 (A) NEGATIVE mg/dL   Protein, ur 100 (A) NEGATIVE mg/dL   Nitrite NEGATIVE NEGATIVE   Leukocytes,Ua NEGATIVE NEGATIVE  Pregnancy, urine     Status:  None   Collection Time: 12/02/19  6:46 PM  Result Value Ref Range   Preg Test, Ur NEGATIVE NEGATIVE  Urinalysis, Microscopic (reflex)     Status: Abnormal   Collection Time:  12/02/19  6:46 PM  Result Value Ref Range   RBC / HPF >50 0 - 5 RBC/hpf   WBC, UA 6-10 0 - 5 WBC/hpf   Bacteria, UA MANY (A) NONE SEEN   Squamous Epithelial / LPF 0-5 0 - 5   Mucus PRESENT    Hyaline Casts, UA PRESENT    Granular Casts, UA PRESENT    WBC Casts, UA PRESENT    Imaging Studies: CT ABDOMEN PELVIS W CONTRAST  Result Date: 12/03/2019 CLINICAL DATA:  COVID-19 positivity of the abdominal pain for 2 days EXAM: CT ABDOMEN AND PELVIS WITH CONTRAST TECHNIQUE: Multidetector CT imaging of the abdomen and pelvis was performed using the standard protocol following bolus administration of intravenous contrast. CONTRAST:  14mL OMNIPAQUE IOHEXOL 300 MG/ML  SOLN COMPARISON:  None. FINDINGS: Lower chest: Lung bases demonstrate diffuse ground-glass opacities within both lungs consistent with the given clinical history of COVID-19 positivity. Changes of prior breast augmentation are noted. Hepatobiliary: Fatty infiltration of the liver is noted. The gallbladder has been surgically removed. Pancreas: Unremarkable. No pancreatic ductal dilatation or surrounding inflammatory changes. Spleen: Normal in size without focal abnormality. Adrenals/Urinary Tract: Adrenal glands are within normal limits. Kidneys demonstrate scattered hypodensities consistent with small cysts. Delayed images demonstrate normal excretion of contrast material. No calculi or obstructive changes are seen. The bladder is decompressed. Stomach/Bowel: Minimal diverticular changes noted without evidence of diverticulitis. No obstructive or inflammatory changes of the colon are seen. The appendix is within normal limits. No small bowel or gastric abnormality is noted. Vascular/Lymphatic: Aortic atherosclerosis. No enlarged abdominal or pelvic lymph nodes. Reproductive:  Uterus is within normal limits. Bilateral ovarian cystic change is noted. The largest of these lies in right ovary measuring 6.5 cm. The left ovarian cystic lesion measures 3.4 cm in greatest length. Other: No abdominal wall hernia or abnormality. No abdominopelvic ascites. Musculoskeletal: No acute or significant osseous findings. IMPRESSION: Airspace opacities in the lung bases consistent with the given clinical history of COVID-19 positivity. Fatty liver. Diverticulosis without diverticulitis. Bilateral ovarian cystic change right greater than left. Electronically Signed   By: Inez Catalina M.D.   On: 12/03/2019 01:17    ED COURSE and MDM  Nursing notes, initial and subsequent vitals signs, including pulse oximetry, reviewed and interpreted by myself.  Vitals:   12/03/19 0025 12/03/19 0027 12/03/19 0156 12/03/19 0214  BP: 133/76  125/76 123/70  Pulse: 86  91 91  Resp: 19 (!) 22 18 18   Temp:  (!) 102.6 F (39.2 C) 99.1 F (37.3 C) 100 F (37.8 C)  TempSrc:  Oral Oral Oral  SpO2: 100%  95% 95%  Weight:      Height:       Medications  0.9 %  sodium chloride infusion (500 mLs Intravenous New Bag/Given 12/03/19 0157)  diphenhydrAMINE (BENADRYL) injection 50 mg (has no administration in time range)  famotidine (PEPCID) IVPB 20 mg premix (has no administration in time range)  methylPREDNISolone sodium succinate (SOLU-MEDROL) 125 mg/2 mL injection 125 mg (has no administration in time range)  albuterol (VENTOLIN HFA) 108 (90 Base) MCG/ACT inhaler 2 puff (has no administration in time range)  EPINEPHrine (EPI-PEN) injection 0.3 mg (has no administration in time range)  acetaminophen (TYLENOL) tablet 650 mg (650 mg Oral Given 12/03/19 0007)  iohexol (OMNIPAQUE) 300 MG/ML solution 100 mL (100 mLs Intravenous Contrast Given 12/03/19 0042)  casirivimab-imdevimab (REGEN-COV) 1,200 mg in sodium chloride 0.9 % 110 mL IVPB (1,200 mg Intravenous New Bag/Given 12/03/19 0159)  ibuprofen (  ADVIL) tablet 400 mg  (400 mg Oral Given 12/03/19 0224)   1:28 AM Patient advised of CT findings consistent with Covid pneumonia.  Risks and benefits of the monoclonal antibody infusion were discussed and the patient gives consent for the infusion.  3:00 AM Patient tolerated monoclonal antibody infusion without difficulty.  She did develop a fever to 102.5 which was relieved with acetaminophen.  She was advised to return for difficulty breathing or other worsening symptoms.   PROCEDURES  Procedures   ED DIAGNOSES     ICD-10-CM   1. Pneumonia due to COVID-19 virus  U07.1    J12.82   2. Left upper quadrant abdominal pain  R10.12        Jnai Snellgrove, Jenny Reichmann, MD 12/03/19 2583

## 2019-12-03 ENCOUNTER — Encounter: Payer: Self-pay | Admitting: Hospice and Palliative Medicine

## 2019-12-03 DIAGNOSIS — R109 Unspecified abdominal pain: Secondary | ICD-10-CM | POA: Diagnosis not present

## 2019-12-03 MED ORDER — FAMOTIDINE IN NACL 20-0.9 MG/50ML-% IV SOLN: 20.0000 mg | Freq: Once | INTRAVENOUS | Status: DC | PRN

## 2019-12-03 MED ORDER — IBUPROFEN 400 MG PO TABS
400.0000 mg | ORAL_TABLET | Freq: Once | ORAL | Status: AC
  Administered 2019-12-03: 400 mg via ORAL

## 2019-12-03 MED ORDER — SODIUM CHLORIDE 0.9 % IV SOLN
INTRAVENOUS | Status: DC | PRN
  Administered 2019-12-03: 500 mL via INTRAVENOUS

## 2019-12-03 MED ORDER — DIPHENHYDRAMINE HCL 50 MG/ML IJ SOLN: 50.0000 mg | Freq: Once | INTRAMUSCULAR | Status: DC | PRN

## 2019-12-03 MED ORDER — ALBUTEROL SULFATE HFA 108 (90 BASE) MCG/ACT IN AERS: 2.0000 | INHALATION_SPRAY | Freq: Once | RESPIRATORY_TRACT | Status: DC | PRN

## 2019-12-03 MED ORDER — IOHEXOL 300 MG/ML  SOLN
100.0000 mL | Freq: Once | INTRAMUSCULAR | Status: AC | PRN
  Administered 2019-12-03: 100 mL via INTRAVENOUS

## 2019-12-03 MED ORDER — EPINEPHRINE 0.3 MG/0.3ML IJ SOAJ: 0.3000 mg | Freq: Once | INTRAMUSCULAR | Status: DC | PRN

## 2019-12-03 MED ORDER — SODIUM CHLORIDE 0.9 % IV SOLN
1200.0000 mg | Freq: Once | INTRAVENOUS | Status: AC
  Administered 2019-12-03: 1200 mg via INTRAVENOUS
  Filled 2019-12-03: qty 10

## 2019-12-03 MED ORDER — METHYLPREDNISOLONE SODIUM SUCC 125 MG IJ SOLR: 125.0000 mg | Freq: Once | INTRAMUSCULAR | Status: DC | PRN

## 2019-12-03 MED ORDER — IBUPROFEN 400 MG PO TABS
ORAL_TABLET | ORAL | Status: AC
  Filled 2019-12-03: qty 1

## 2019-12-03 NOTE — ED Notes (Addendum)
Tolerating regen-cov well, has reviewed fact sheet and spoken with friends and is reassured. VS remain stable throughout first fifteen minutes of infusion.

## 2019-12-05 ENCOUNTER — Telehealth: Payer: BC Managed Care – PPO | Admitting: Medical

## 2019-12-05 ENCOUNTER — Emergency Department (HOSPITAL_BASED_OUTPATIENT_CLINIC_OR_DEPARTMENT_OTHER): Payer: BC Managed Care – PPO

## 2019-12-05 ENCOUNTER — Encounter (HOSPITAL_BASED_OUTPATIENT_CLINIC_OR_DEPARTMENT_OTHER): Payer: Self-pay | Admitting: *Deleted

## 2019-12-05 ENCOUNTER — Emergency Department (HOSPITAL_BASED_OUTPATIENT_CLINIC_OR_DEPARTMENT_OTHER)
Admission: EM | Admit: 2019-12-05 | Discharge: 2019-12-05 | Disposition: A | Discharge status: Home / Self Care | Payer: BC Managed Care – PPO | Attending: Emergency Medicine | Admitting: Emergency Medicine

## 2019-12-05 ENCOUNTER — Other Ambulatory Visit: Payer: Self-pay

## 2019-12-05 DIAGNOSIS — J1282 Pneumonia due to coronavirus disease 2019: Secondary | ICD-10-CM | POA: Insufficient documentation

## 2019-12-05 DIAGNOSIS — U071 COVID-19: Secondary | ICD-10-CM

## 2019-12-05 DIAGNOSIS — Z7982 Long term (current) use of aspirin: Secondary | ICD-10-CM | POA: Diagnosis not present

## 2019-12-05 DIAGNOSIS — J45909 Unspecified asthma, uncomplicated: Secondary | ICD-10-CM | POA: Insufficient documentation

## 2019-12-05 DIAGNOSIS — R0602 Shortness of breath: Secondary | ICD-10-CM | POA: Diagnosis not present

## 2019-12-05 LAB — CBC WITH DIFFERENTIAL/PLATELET
Abs Immature Granulocytes: 0.03 10*3/uL (ref 0.00–0.07)
Basophils Absolute: 0 10*3/uL (ref 0.0–0.1)
Basophils Relative: 0 %
Eosinophils Absolute: 0 10*3/uL (ref 0.0–0.5)
Eosinophils Relative: 0 %
HCT: 45.4 % (ref 36.0–46.0)
Hemoglobin: 15.4 g/dL — ABNORMAL HIGH (ref 12.0–15.0)
Immature Granulocytes: 1 %
Lymphocytes Relative: 22 %
Lymphs Abs: 1.1 10*3/uL (ref 0.7–4.0)
MCH: 30.1 pg (ref 26.0–34.0)
MCHC: 33.9 g/dL (ref 30.0–36.0)
MCV: 88.8 fL (ref 80.0–100.0)
Monocytes Absolute: 0.3 10*3/uL (ref 0.1–1.0)
Monocytes Relative: 6 %
Neutro Abs: 3.5 10*3/uL (ref 1.7–7.7)
Neutrophils Relative %: 71 %
Platelets: 174 10*3/uL (ref 150–400)
RBC: 5.11 MIL/uL (ref 3.87–5.11)
RDW: 11.9 % (ref 11.5–15.5)
WBC: 4.9 10*3/uL (ref 4.0–10.5)
nRBC: 0 % (ref 0.0–0.2)

## 2019-12-05 LAB — BASIC METABOLIC PANEL
Anion gap: 12 (ref 5–15)
BUN: 16 mg/dL (ref 6–20)
CO2: 27 mmol/L (ref 22–32)
Calcium: 9 mg/dL (ref 8.9–10.3)
Chloride: 100 mmol/L (ref 98–111)
Creatinine, Ser: 0.88 mg/dL (ref 0.44–1.00)
GFR calc Af Amer: >60 mL/min (ref >60)
GFR calc non Af Amer: >60 mL/min (ref >60)
Glucose, Bld: 98 mg/dL (ref 70–99)
Potassium: 3.6 mmol/L (ref 3.5–5.1)
Sodium: 139 mmol/L (ref 135–145)

## 2019-12-05 MED ORDER — BENZONATATE 100 MG PO CAPS: 100.0000 mg | ORAL_CAPSULE | Freq: Three times a day (TID) | ORAL | 0 refills | Status: DC

## 2019-12-05 MED ORDER — IOHEXOL 350 MG/ML SOLN
100.0000 mL | Freq: Once | INTRAVENOUS | Status: AC | PRN
  Administered 2019-12-05: 78 mL via INTRAVENOUS

## 2019-12-05 MED FILL — BENZONATATE 100 MG CAPS: 100 | 7 days supply | Qty: 21 | Fill #0

## 2019-12-05 NOTE — ED Notes (Signed)
Pt able to maintain room air saturation of 94-95% while ambulating in room and speaking with staff, Reather Converse MD made aware.

## 2019-12-05 NOTE — Discharge Instructions (Addendum)
Please take the benzonatate, as directed.    Continue to check your pulse oximetry as well as temperature.  Ibuprofen or tylenol, as needed.  It is important that you continue to hydrate and eat regular meals.  Follow up with your primary care provider regarding today's encounter.  Return to the ED or seek immediate medical attention should you experience any new or worsening symptoms.

## 2019-12-05 NOTE — ED Triage Notes (Signed)
+  Covid on the 16th of the month.  Shortness of breath started this morning.

## 2019-12-05 NOTE — ED Provider Notes (Signed)
[facility] EMERGENCY DEPARTMENT Provider Note   CSN: [id number] Arrival date & time: 12/05/19  0957     History Chief Complaint  Patient presents with  . Shortness of Breath    Lisa Ellis is a 44 y.o. female with PMH of asthma and positive COVID-19 testing on 11/27/2019 who presents to the ED with complaints of acute onset shortness of breath.    I reviewed patient's medical record and it appears that she had encounter for COVID-19 testing on 11/25/2019.  However, she was asymptomatic at that time and did not become symptomatic until a few days later.  She had a physical visit encounter with her primary care provider on 12/02/2019 who subsequently referred her for monoclonal antibody infusion.  However, given her complaints of abdominal distention and pain under her left ribs, her PCP was concerned for GI processes versus pneumonia/PE and encouraged her to go to the ER.  CT was obtained of abdomen and pelvis with contrast that day in the ER which revealed airspace opacities in the lung bases consistent with COVID-19 pneumonia.  On my examination, patient does not appear to be in any acute distress.  However, she states that this morning she noted her breathing to be much more labored and states that she cannot catch her breath.  She states that every time she takes a deep breath she coughs.  She is also noticed hemoptysis with moderate amount of blood-tinged sputum which is new.  She denies any overt chest pain, but does note a "gurgling discomfort" under her ribs bilaterally.  She denies any history of clots or clotting disorder, unilateral extremity swelling or edema, tobacco use, OCPs, hematemesis, vomiting, or exertional chest pain.  She has a pulse oximeter at home and has been using it regularly.  She has typically stayed above 90%, with 1 brief dip bilateral that quickly corrected.  She has already received the MAB infusion.  She has been taking Mucinex for cough  symptoms.  Patient and her husband was unvaccinated for COVID-19.  She states that her husband was just admitted to [facility] for his symptoms.  HPI     Past Medical History:  Diagnosis Date  . Allergy   . Asthma    Only wheezed on time associated with pneumonia. none since.  . Depression    only had brief episode in 2006 for couple of months when dad passed away.    Patient Active Problem List   Diagnosis Date Noted  . Dyslipidemia 06/15/2017  . Wellness examination 12/22/2014  . Atypical chest pain 11/12/2014  . Upper back pain on left side 11/12/2014  . Costochondritis 11/12/2014  . Allergic rhinitis 11/12/2014    Past Surgical History:  Procedure Laterality Date  . AUGMENTATION MAMMAPLASTY    . BREAST SURGERY     augmentation procedure.  . CHOLECYSTECTOMY    . NASAL SINUS SURGERY       OB History   No obstetric history on file.     Family History  Problem Relation Age of Onset  . Hypertension Mother   . Diabetes Father   . Hypertension Maternal Grandfather   . Diabetes Paternal Grandmother     Social History   Tobacco Use  . Smoking status: Never Smoker  . Smokeless tobacco: Never Used  Vaping Use  . Vaping Use: Never used  Substance Use Topics  . Alcohol use: No    Alcohol/week: 0.0 standard drinks  . Drug use: No    Home Medications  Prior to Admission medications   Medication Sig Start Date End Date Taking? Authorizing Provider  acetaminophen (TYLENOL) 500 MG tablet Take 1,000 mg by mouth every 6 (six) hours as needed for mild pain or fever.   Yes [provider]  aspirin EC 81 MG tablet Take 1 tablet (81 mg total) by mouth daily. Patient taking differently: Take 81 mg by mouth at bedtime.  06/15/17  Yes Park Liter, MD  dextromethorphan-guaiFENesin [facility] DM) 30-600 MG 12hr tablet Take 2 tablets by mouth every 12 (twelve) hours as needed for cough.   Yes [provider]    Allergies    Patient has no known  allergies.  Review of Systems   Review of Systems  All other systems reviewed and are negative.   Physical Exam Updated Vital Signs BP 116/75   Pulse 83   Temp 98.1 F (36.7 C) (Oral)   Resp (!) 25   Ht 5\' 7"  (1.702 m)   Wt 98.4 kg   LMP 11/28/2019   SpO2 96%   BMI 33.99 kg/m   Physical Exam Vitals and nursing note reviewed. Exam conducted with a chaperone present.  Constitutional:      General: She is not in acute distress.    Appearance: She is ill-appearing.  HENT:     Head: Normocephalic and atraumatic.  Eyes:     General: No scleral icterus.    Conjunctiva/sclera: Conjunctivae normal.  Cardiovascular:     Rate and Rhythm: Normal rate and regular rhythm.     Pulses: Normal pulses.     Heart sounds: Normal heart sounds.  Pulmonary:     Comments: No significant increased work of breathing.  Relatively shallow breathing.  Mildly tachypneic.  98% on RA at rest.  Rales noted in lower lobes bilaterally.  Coughing frequently.  Cough with deep inspiration.  No distress. Musculoskeletal:     Cervical back: Normal range of motion and neck supple. No rigidity.     Right lower leg: No edema.     Left lower leg: No edema.  Skin:    General: Skin is dry.     Capillary Refill: Capillary refill takes less than 2 seconds.  Neurological:     Mental Status: She is alert and oriented to person, place, and time.     GCS: GCS eye subscore is 4. GCS verbal subscore is 5. GCS motor subscore is 6.  Psychiatric:        Mood and Affect: Mood normal.        Behavior: Behavior normal.        Thought Content: Thought content normal.     ED Results / Procedures / Treatments   Labs (all labs ordered are listed, but only abnormal results are displayed) Labs Reviewed  CBC WITH DIFFERENTIAL/PLATELET - Abnormal; Notable for the following components:      Result Value   Hemoglobin 15.4 (*)    All other components within normal limits  BASIC METABOLIC PANEL     EKG None  Radiology CT Angio Chest PE W/Cm &/Or Wo Cm  Result Date: 12/05/2019 CLINICAL DATA:  Increasing short of breath, chest pain. COVID 19 positive EXAM: CT ANGIOGRAPHY CHEST WITH CONTRAST TECHNIQUE: Multidetector CT imaging of the chest was performed using the standard protocol during bolus administration of intravenous contrast. Multiplanar CT image reconstructions and MIPs were obtained to evaluate the vascular anatomy. CONTRAST:  68mL OMNIPAQUE IOHEXOL 350 MG/ML SOLN COMPARISON:  CT abdomen pelvis 12/03/2019 FINDINGS: Cardiovascular: Good pulmonary  artery enhancement. Negative for pulmonary embolism. Pulmonary arteries normal in caliber. Thoracic aorta normal. Heart size mildly enlarged. No pericardial effusion. Mediastinum/Nodes: Negative for mass or adenopathy. Lungs/Pleura: Diffuse bilateral patchy infiltrates bilaterally with peripheral predominance. Progression since CT abdomen pelvis of 12/03/2019. No pleural effusion. Upper Abdomen: Fatty infiltration liver.  No acute abnormality. Musculoskeletal: Negative Review of the MIP images confirms the above findings. IMPRESSION: Negative for pulmonary embolism Diffuse bilateral airspace disease compatible with COVID pneumonia. Progression in the bases since the prior CT abdomen pelvis 2 days ago. Electronically Signed   By: Franchot Gallo M.D.   On: 12/05/2019 12:43    Procedures Procedures (including critical care time)  Medications Ordered in ED Medications  iohexol (OMNIPAQUE) 350 MG/ML injection 100 mL (78 mLs Intravenous Contrast Given 12/05/19 1228)    ED Course  I have reviewed the triage vital signs and the nursing notes.  Pertinent labs & imaging results that were available during my care of the patient were reviewed by me and considered in my medical decision making (see chart for details).    MDM Rules/Calculators/A&P                          Given patient's sudden onset worsening shortness of breath symptoms that  began this morning, approximately 8 to 10 days into her course of COVID-19, with associated hemoptysis, will obtain CTA PE study.  Will obtain basic labs prior imaging.  Patient is in no acute distress and while she is coughing frequently and endorsing pleuritic symptoms, is hemodynamically stable and without any significant increased work of breathing.  Patient has already received the MAB infusion.  If CT is negative, will ambulate patient here in the room to ensure that she maintains good oxygenation.  If no supplemental oxygen is required, I feel as though she is stable for discharge home.  She already has a pulse oximeter and is aware of strict ED return precautions.  Will prescribe Tessalon Perles given her persistent coughing despite Mucinex OTC.  CTA is personally reviewed and negative for pulmonary embolism. There is diffuse bilateral airspace disease compatible with COVID-19 pneumonia that appears to have progressed since prior imaging obtained 2 days ago.    Patient was ambulated here in the ED for five minutes without any desaturation or increased WOB.  Will discharge home with tessalon perles. Continued pulse oximetry.     All of the evaluation and work-up results were discussed with the patient and any family at bedside.  Patient and/or family were informed that while patient is appropriate for discharge at this time, some medical emergencies may only develop or become detectable after a period of time.  I specifically instructed patient and/or family to return to return to the ED or seek immediate medical attention for any new or worsening symptoms.  They were provided opportunity to ask any additional questions and have none at this time.  Prior to discharge patient is feeling well, agreeable with plan for discharge home.  They have expressed understanding of verbal discharge instructions as well as return precautions and are agreeable to the plan.   Ceirra Belli was evaluated in Emergency  Department on 12/05/2019 for the symptoms described in the history of present illness. She was evaluated in the context of the global COVID-19 pandemic, which necessitated consideration that the patient might be at risk for infection with the SARS-CoV-2 virus that causes COVID-19. Institutional protocols and algorithms that pertain to the evaluation of  patients at risk for COVID-19 are in a state of rapid change based on information released by regulatory bodies including the CDC and federal and state organizations. These policies and algorithms were followed during the patient's care in the ED.   Final Clinical Impression(s) / ED Diagnoses Final diagnoses:  Pneumonia due to COVID-19 virus    Rx / DC Orders ED Discharge Orders    None       Corena Herter, PA-C 12/05/19 1537    Elnora Morrison, MD 12/05/19 1551

## 2019-12-18 ENCOUNTER — Telehealth: Payer: Self-pay | Admitting: Medical

## 2019-12-18 ENCOUNTER — Other Ambulatory Visit: Payer: Self-pay | Admitting: Medical

## 2019-12-18 ENCOUNTER — Other Ambulatory Visit: Payer: Self-pay

## 2019-12-18 ENCOUNTER — Encounter: Payer: Self-pay | Admitting: Medical

## 2019-12-18 ENCOUNTER — Ambulatory Visit: Payer: BC Managed Care – PPO | Admitting: Medical

## 2019-12-18 ENCOUNTER — Ambulatory Visit (HOSPITAL_BASED_OUTPATIENT_CLINIC_OR_DEPARTMENT_OTHER)
Admission: RE | Admit: 2019-12-18 | Discharge: 2019-12-18 | Disposition: A | Discharge status: Home / Self Care | Payer: BC Managed Care – PPO | Source: Ambulatory Visit | Attending: Medical | Admitting: Medical

## 2019-12-18 VITALS — BP 115/70 | HR 83 | Resp 18 | Ht 67.0 in | Wt 226.0 lb

## 2019-12-18 DIAGNOSIS — R059 Cough, unspecified: Secondary | ICD-10-CM | POA: Diagnosis not present

## 2019-12-18 DIAGNOSIS — Z8616 Personal history of COVID-19: Secondary | ICD-10-CM

## 2019-12-18 DIAGNOSIS — J189 Pneumonia, unspecified organism: Secondary | ICD-10-CM | POA: Diagnosis not present

## 2019-12-18 DIAGNOSIS — J90 Pleural effusion, not elsewhere classified: Secondary | ICD-10-CM | POA: Diagnosis not present

## 2019-12-18 MED ORDER — HYDROCODONE-HOMATROPINE 5-1.5 MG/5ML PO SYRP: 5.0000 mL | ORAL_SOLUTION | Freq: Four times a day (QID) | ORAL | 0 refills | Status: DC | PRN

## 2019-12-18 MED ORDER — AZITHROMYCIN 250 MG PO TABS: ORAL_TABLET | ORAL | 0 refills | Status: DC

## 2019-12-18 MED FILL — AZITHROMYCIN 250 MG TABLET: 250 | 5 days supply | Qty: 6 | Fill #0

## 2019-12-18 MED FILL — HYDROMET SYRUP: 5-1.5 | 5 days supply | Qty: 100 | Fill #0

## 2019-12-18 NOTE — Progress Notes (Signed)
Subjective:    Patient ID: Lisa Ellis, female    DOB: [birth year], 44 y.o.   MRN: [id number]  HPI  Pt in for follow up.  Pt has had covid (dx 11-27-19). Pt states overall feeling well. She states her symptoms presently is random cough. Most of the time it is dry. Pt is not having wheezing or sob. She also has some fatigue.   Pt went to ED. But was not hospitalized. For time had cough, dypsnea and dx with covid pneumonia.  Never hospitalized.  Pt states given benzonatate for cough.   Pt states she did get monoclonal antibodies.  Pt o2 sat yesterday was 97%. Has gradually gotten better and now in for follow up.  Review of Systems  Constitutional: Negative for chills, fatigue and fever.  HENT: Negative for congestion and ear pain.   Respiratory: Positive for cough. Negative for chest tightness, shortness of breath and wheezing.   Cardiovascular: Negative for chest pain and palpitations.  Gastrointestinal: Negative for abdominal pain.  Genitourinary: Negative for dysuria.  Musculoskeletal: Negative for back pain.  Neurological: Negative for dizziness, speech difficulty, light-headedness and headaches.  Hematological: Negative for adenopathy. Does not bruise/bleed easily.  Psychiatric/Behavioral: Negative for behavioral problems and decreased concentration.    Past Medical History:  Diagnosis Date   Allergy    Asthma    Only wheezed on time associated with pneumonia. none since.   Depression    only had brief episode in 2006 for couple of months when dad passed away.     Social History   Socioeconomic History   Marital status: Married    Spouse name: Not on file   Number of children: Not on file   Years of education: Not on file   Highest education level: Not on file  Occupational History   Not on file  Tobacco Use   Smoking status: Never Smoker   Smokeless tobacco: Never Used  Vaping Use   Vaping Use: Never used  Substance and Sexual Activity   Alcohol  use: No    Alcohol/week: 0.0 standard drinks   Drug use: No   Sexual activity: Yes  Other Topics Concern   Not on file  Social History Narrative   Not on file   Social Determinants of Health   Financial Resource Strain:    Difficulty of Paying Living Expenses: Not on file  Food Insecurity:    Worried About [place] in the Last Year: Not on file   Ran Out of Food in the Last Year: Not on file  Transportation Needs:    Lack of Transportation (Medical): Not on file   Lack of Transportation (Non-Medical): Not on file  Physical Activity:    Days of Exercise per Week: Not on file   Minutes of Exercise per Session: Not on file  Stress:    Feeling of Stress : Not on file  Social Connections:    Frequency of Communication with Friends and Family: Not on file   Frequency of Social Gatherings with Friends and Family: Not on file   Attends Religious Services: Not on file   Active Member of Clubs or Organizations: Not on file   Attends Archivist Meetings: Not on file   Marital Status: Not on file  Intimate Partner Violence:    Fear of Current or Ex-Partner: Not on file   Emotionally Abused: Not on file   Physically Abused: Not on file   Sexually Abused: Not on file  Past Surgical History:  Procedure Laterality Date   AUGMENTATION MAMMAPLASTY     BREAST SURGERY     augmentation procedure.   CHOLECYSTECTOMY     NASAL SINUS SURGERY      Family History  Problem Relation Age of Onset   Hypertension Mother    Diabetes Father    Hypertension Maternal Grandfather    Diabetes Paternal Grandmother     No Known Allergies  Current Outpatient Medications on File Prior to Visit  Medication Sig Dispense Refill   dextromethorphan-guaiFENesin (MUCINEX DM) 30-600 MG 12hr tablet Take 2 tablets by mouth every 12 (twelve) hours as needed for cough.     acetaminophen (TYLENOL) 500 MG tablet Take 1,000 mg by mouth every 6 (six) hours as  needed for mild pain or fever. (Patient not taking: Reported on 12/18/2019)     aspirin EC 81 MG tablet Take 1 tablet (81 mg total) by mouth daily. (Patient not taking: Reported on 12/18/2019) 90 tablet 3   benzonatate (TESSALON) 100 MG capsule Take 1 capsule (100 mg total) by mouth every 8 (eight) hours. (Patient not taking: Reported on 12/18/2019) 21 capsule 0   Current Facility-Administered Medications on File Prior to Visit  Medication Dose Route Frequency Provider Last Rate Last Admin   0.9 %  sodium chloride infusion   Intravenous Once Jaynee Eagles, PA-C        BP 115/70    Pulse 83    Resp 18    Ht 5\' 7"  (1.702 m)    Wt 226 lb (102.5 kg)    LMP 11/28/2019    SpO2 96%    BMI 35.40 kg/m       Objective:   Physical Exam  General Mental Status- Alert. General Appearance- Not in acute distress.   Skin General: Color- Normal Color. Moisture- Normal Moisture.  Neck Carotid Arteries- Normal color. Moisture- Normal Moisture. No carotid bruits. No JVD.  Chest and Lung Exam Auscultation: Breath Sounds:-Normal.  Cardiovascular Auscultation:Rythm- Regular. Murmurs & Other Heart Sounds:Auscultation of the heart reveals- No Murmurs.  Abdomen Inspection:-Inspeection Normal. Palpation/Percussion:Note:No mass. Palpation and Percussion of the abdomen reveal- Non Tender, Non Distended + BS, no rebound or guarding.   Neurologic Cranial Nerve exam:- CN III-XII intact(No nystagmus), symmetric smile. Strength:- 5/5 equal and symmetric strength both upper and lower extremities.    Assessment & Plan:  For history of covid with pneumonia will get cxr today to assess if lungs now clear.  With your persisting cough and fatigue may give zpack if cxr will indicating pneumonia.  Hycodan for cough. Can stop benzonatate.  Sometimes fatigue can linger for a while but most I have seen feel better within a month.  Follow up 2 weeks or as needed

## 2019-12-18 NOTE — Telephone Encounter (Signed)
Rx azithromycin sent to pt pharmacy. 

## 2019-12-18 NOTE — Patient Instructions (Addendum)
For history of covid with pneumonia will get cxr today to assess if lungs now clear.  With your persisting cough and fatigue may give zpack if cxr will indicating pneumonia.  Hycodan for cough. Can stop benzonatate.  Sometimes fatigue can linger for a while but most I have seen feel better within a month.  Follow up 2 weeks or as needed

## 2020-02-11 ENCOUNTER — Ambulatory Visit: Payer: BC Managed Care – PPO | Admitting: Medical

## 2020-02-16 ENCOUNTER — Ambulatory Visit: Payer: BC Managed Care – PPO | Admitting: Medical

## 2020-02-18 ENCOUNTER — Ambulatory Visit: Payer: BC Managed Care – PPO | Admitting: Medical

## 2020-02-18 ENCOUNTER — Ambulatory Visit (INDEPENDENT_AMBULATORY_CARE_PROVIDER_SITE_OTHER): Payer: BC Managed Care – PPO | Admitting: Medical

## 2020-02-18 ENCOUNTER — Ambulatory Visit (HOSPITAL_BASED_OUTPATIENT_CLINIC_OR_DEPARTMENT_OTHER)
Admission: RE | Admit: 2020-02-18 | Discharge: 2020-02-18 | Disposition: A | Discharge status: Home / Self Care | Payer: BC Managed Care – PPO | Source: Ambulatory Visit | Attending: Medical | Admitting: Medical

## 2020-02-18 ENCOUNTER — Other Ambulatory Visit: Payer: Self-pay

## 2020-02-18 VITALS — BP 125/78 | HR 80 | Resp 18 | Ht 67.0 in | Wt 231.0 lb

## 2020-02-18 DIAGNOSIS — R0789 Other chest pain: Secondary | ICD-10-CM

## 2020-02-18 DIAGNOSIS — R5383 Other fatigue: Secondary | ICD-10-CM

## 2020-02-18 DIAGNOSIS — M542 Cervicalgia: Secondary | ICD-10-CM | POA: Diagnosis not present

## 2020-02-18 DIAGNOSIS — M25551 Pain in right hip: Secondary | ICD-10-CM | POA: Diagnosis not present

## 2020-02-18 DIAGNOSIS — M25559 Pain in unspecified hip: Secondary | ICD-10-CM

## 2020-02-18 DIAGNOSIS — M2550 Pain in unspecified joint: Secondary | ICD-10-CM

## 2020-02-18 DIAGNOSIS — M25561 Pain in right knee: Secondary | ICD-10-CM | POA: Diagnosis not present

## 2020-02-18 DIAGNOSIS — E785 Hyperlipidemia, unspecified: Secondary | ICD-10-CM | POA: Diagnosis not present

## 2020-02-18 DIAGNOSIS — M791 Myalgia, unspecified site: Secondary | ICD-10-CM | POA: Diagnosis not present

## 2020-02-18 LAB — COMPREHENSIVE METABOLIC PANEL
ALT: 25 U/L (ref 0–35)
AST: 20 U/L (ref 0–37)
Albumin: 4.4 g/dL (ref 3.5–5.2)
Alkaline Phosphatase: 75 U/L (ref 39–117)
BUN: 14 mg/dL (ref 6–23)
CO2: 31 mEq/L (ref 19–32)
Calcium: 9.5 mg/dL (ref 8.4–10.5)
Chloride: 102 mEq/L (ref 96–112)
Creatinine, Ser: 0.89 mg/dL (ref 0.40–1.20)
GFR: 78.06 mL/min (ref >60.00)
Glucose, Bld: 94 mg/dL (ref 70–99)
Potassium: 4.5 mEq/L (ref 3.5–5.1)
Sodium: 138 mEq/L (ref 135–145)
Total Bilirubin: 0.5 mg/dL (ref 0.2–1.2)
Total Protein: 7.5 g/dL (ref 6.0–8.3)

## 2020-02-18 LAB — CBC WITH DIFFERENTIAL/PLATELET
Basophils Absolute: 0.1 10*3/uL (ref 0.0–0.1)
Basophils Relative: 1 % (ref 0.0–3.0)
Eosinophils Absolute: 0.3 10*3/uL (ref 0.0–0.7)
Eosinophils Relative: 2.9 % (ref 0.0–5.0)
HCT: 45.5 % (ref 36.0–46.0)
Hemoglobin: 15.3 g/dL — ABNORMAL HIGH (ref 12.0–15.0)
Lymphocytes Relative: 29.5 % (ref 12.0–46.0)
Lymphs Abs: 2.7 10*3/uL (ref 0.7–4.0)
MCHC: 33.7 g/dL (ref 30.0–36.0)
MCV: 90.7 fl (ref 78.0–100.0)
Monocytes Absolute: 0.6 10*3/uL (ref 0.1–1.0)
Monocytes Relative: 6.6 % (ref 3.0–12.0)
Neutro Abs: 5.4 10*3/uL (ref 1.4–7.7)
Neutrophils Relative %: 60 % (ref 43.0–77.0)
Platelets: 271 10*3/uL (ref 150.0–400.0)
RBC: 5.01 Mil/uL (ref 3.87–5.11)
RDW: 13.6 % (ref 11.5–15.5)
WBC: 9 10*3/uL (ref 4.0–10.5)

## 2020-02-18 LAB — TROPONIN I (HIGH SENSITIVITY): High Sens Troponin I: 4 ng/L (ref 2–17)

## 2020-02-18 LAB — T4, FREE: Free T4: 0.76 ng/dL (ref 0.60–1.60)

## 2020-02-18 LAB — SEDIMENTATION RATE: Sed Rate: 5 mm/hr (ref 0–20)

## 2020-02-18 LAB — C-REACTIVE PROTEIN: CRP: 1.2 mg/dL (ref 0.5–20.0)

## 2020-02-18 LAB — TSH: TSH: 0.74 u[IU]/mL (ref 0.35–4.50)

## 2020-02-18 LAB — VITAMIN B12: Vitamin B-12: 294 pg/mL (ref 211–911)

## 2020-02-18 MED ORDER — CYCLOBENZAPRINE HCL 5 MG PO TABS: 5.0000 mg | ORAL_TABLET | Freq: Every day | ORAL | 0 refills | Status: DC

## 2020-02-18 MED ORDER — KETOROLAC TROMETHAMINE 60 MG/2ML IM SOLN
60.0000 mg | Freq: Once | INTRAMUSCULAR | Status: AC
  Administered 2020-02-18: 60 mg via INTRAMUSCULAR

## 2020-02-18 NOTE — Progress Notes (Signed)
Subjective:    Patient ID: Lisa Ellis, female    DOB: 04-Nov-1975, 44 y.o.   MRN: [id number]  HPI  Pt in with some random pain all over her body.  She states most frequent is chest pain. Pain is random and intensity varies. Last time had chest pain was this morning last for 2 minutes. Pain will occur few times a day for a few weeks.  Note last night also had some chest pain, left arm pain and rt leg pain. She considered ED then decided against. No chest pain presently. No left arm pain presently.  Pt had stress ecg in past.  Stress ECG conclusions: There were no stress arrhythmias or  conduction abnormalities. The stress ECG was negative for  ischemia.  - Staged echo: There was no echocardiographic evidence for  stress-induced ischemia.  She describes also left upper quadrant pain, left arm pain, rt leg pain and both sides of her neck.   Pains are effecting quality of her sleep.  Pt has tried tylenol , advil and methocarbinol.    Back in sept post covid had bodyaches. But then went away quickly. Before covid had negative inflammatory studies.     Review of Systems  Constitutional: Positive for fatigue.  HENT: Negative for dental problem.   Cardiovascular: Negative for chest pain and palpitations.       Atypical chest pain. See hpi. None presently.  Gastrointestinal: Negative for abdominal pain.  Musculoskeletal: Positive for myalgias. Negative for back pain, joint swelling and neck stiffness.       See hpi.  Skin: Negative for pallor.  Neurological: Negative for dizziness, tremors, numbness and headaches.  Hematological: Negative for adenopathy. Does not bruise/bleed easily.  Psychiatric/Behavioral: Negative for behavioral problems and confusion.    Past Medical History:  Diagnosis Date  . Allergy   . Asthma    Only wheezed on time associated with pneumonia. none since.  . Depression    only had brief episode in 2006 for couple of months when dad passed away.      Social History   Socioeconomic History  . Marital status: Married    Spouse name: Not on file  . Number of children: Not on file  . Years of education: Not on file  . Highest education level: Not on file  Occupational History  . Not on file  Tobacco Use  . Smoking status: Never Smoker  . Smokeless tobacco: Never Used  Vaping Use  . Vaping Use: Never used  Substance and Sexual Activity  . Alcohol use: No    Alcohol/week: 0.0 standard drinks  . Drug use: No  . Sexual activity: Yes  Other Topics Concern  . Not on file  Social History Narrative  . Not on file   Social Determinants of Health   Financial Resource Strain:   . Difficulty of Paying Living Expenses: Not on file  Food Insecurity:   . Worried About [employer] fund[employer] in the Last Year: Not on file  . Ran Out of Food in the Last Year: Not on file  Transportation Needs:   . Lack of Transportation (Medical): Not on file  . Lack of Transportation (Non-Medical): Not on file  Physical Activity:   . Days of Exercise per Week: Not on file  . Minutes of Exercise per Session: Not on file  Stress:   . Feeling of Stress : Not on file  Social Connections:   . Frequency of Communication with Friends and Family: Not  on file  . Frequency of Social Gatherings with Friends and Family: Not on file  . Attends Religious Services: Not on file  . Active Member of Clubs or Organizations: Not on file  . Attends Archivist Meetings: Not on file  . Marital Status: Not on file  Intimate Partner Violence:   . Fear of Current or Ex-Partner: Not on file  . Emotionally Abused: Not on file  . Physically Abused: Not on file  . Sexually Abused: Not on file    Past Surgical History:  Procedure Laterality Date  . AUGMENTATION MAMMAPLASTY    . BREAST SURGERY     augmentation procedure.  . CHOLECYSTECTOMY    . NASAL SINUS SURGERY      Family History  Problem Relation Age of Onset  . Hypertension Mother   . Diabetes  Father   . Hypertension Maternal Grandfather   . Diabetes Paternal Grandmother     No Known Allergies  Current Outpatient Medications on File Prior to Visit  Medication Sig Dispense Refill  . aspirin EC 81 MG tablet Take 1 tablet (81 mg total) by mouth daily. 90 tablet 3  . acetaminophen (TYLENOL) 500 MG tablet Take 1,000 mg by mouth every 6 (six) hours as needed for mild pain or fever. (Patient not taking: Reported on 12/18/2019)    . azithromycin (ZITHROMAX) 250 MG tablet Take 2 tablets by mouth on day 1, followed by 1 tablet by mouth daily for 4 days. 6 tablet 0  . benzonatate (TESSALON) 100 MG capsule Take 1 capsule (100 mg total) by mouth every 8 (eight) hours. (Patient not taking: Reported on 12/18/2019) 21 capsule 0  . dextromethorphan-guaiFENesin (MUCINEX DM) 30-600 MG 12hr tablet Take 2 tablets by mouth every 12 (twelve) hours as needed for cough.    Marland Kitchen HYDROcodone-homatropine (HYCODAN) 5-1.5 MG/5ML syrup Take 5 mLs by mouth every 6 (six) hours as needed. 100 mL 0   Current Facility-Administered Medications on File Prior to Visit  Medication Dose Route Frequency Provider Last Rate Last Admin  . 0.9 %  sodium chloride infusion   Intravenous Once Jaynee Eagles, PA-C        BP 125/78   Pulse 80   Resp 18   Ht 5\' 7"  (1.702 m)   Wt 231 lb (104.8 kg)   SpO2 99%   BMI 36.18 kg/m       Objective:   Physical Exam   General Mental Status- Alert. General Appearance- Not in acute distress.   Skin General: Color- Normal Color. Moisture- Normal Moisture.  Neck Carotid Arteries- Normal color. Moisture- Normal Moisture. No carotid bruits. No JVD.  No mid cervical spine tenderness to palpation.  Does have bilateral trapezius tenderness to palpation.  Chest and Lung Exam Auscultation: Breath Sounds:-Normal.  Cardiovascular Auscultation:Rythm- Regular. Murmurs & Other Heart Sounds:Auscultation of the heart reveals- No Murmurs.  Abdomen Inspection:-Inspeection  Normal. Palpation/Percussion:Note:No mass. Palpation and Percussion of the abdomen reveal- Non Tender, Non Distended + BS, no rebound or guarding.   Neurologic Cranial Nerve exam:- CN III-XII intact(No nystagmus), symmetric smile. Strength:- 5/5 equal and symmetric strength both upper and lower extremities.  Anterior thorax-left costochondral junction reproducible pain on palpation.  Elbows-good range of motion no pain presently.  Knees-normal range of motion, no instability and no crepitus.  Right hip-mild pain on range of motion.      Assessment & Plan:  You do have history of recent atypical chest pain.  On exam you do have some reproducible left-sided  costochondral junction tenderness to palpation.  EKG today showed normal sinus rhythm no ischemic changes.  We did give you Toradol 60 mg IM injection.  Starting tomorrow you can use Aleve over-the-counter.  For caution sake did get 1 set of stat troponin.  If you have worsening or changing signs or symptoms still would need to be seen in the emergency department.  Note prior referral to cardiology shows negative stress test.  You do have history of hyperlipidemia but she is not fasting presently.  You do have neck pain recently and areas of trapezius bilaterally.  No mid cervical spine area pain.  Will prescribe Flexeril 5 mg tablets to use at night over the next 7 nights.  Various myalgias and arthralgias today.  Among these areas are upper extremity, elbows, right knee, right hip and describes a left upper quadrant/abdomen pain.  We will get inflammatory lab studies listed below.  For recent fatigue will get CBC, CMP, thyroid studies, B12 and B1.  For recent right hip and right knee pain we will get x-rays of both areas.  If all lab studies negative consider possible fibromyalgia.  We will need to follow your atypical intermittent chest pain.  If that does not clear then will need to refer you back to cardiology.  If that worsens  after hours or over the weekend then recommend ED evaluation.  Follow-up in 7 to 10 days or as needed.  Time spent with patient today was 40  minutes which consisted of chart reediew, discussing diagnoses, work up treatment and documentation.

## 2020-02-18 NOTE — Patient Instructions (Signed)
You do have history of recent atypical chest pain.  On exam you do have some reproducible left-sided costochondral junction tenderness to palpation.  EKG today showed normal sinus rhythm no ischemic changes.  We did give you Toradol 60 mg IM injection.  Starting tomorrow you can use Aleve over-the-counter.  For caution sake did get 1 set of stat troponin.  If you have worsening or changing signs or symptoms still would need to be seen in the emergency department.  Note prior referral to cardiology shows negative stress test.  You do have history of hyperlipidemia but she is not fasting presently.  You do have neck pain recently and areas of trapezius bilaterally.  No mid cervical spine area pain.  Will prescribe Flexeril 5 mg tablets to use at night over the next 7 nights.  Various myalgias and arthralgias today.  Among these areas are upper extremity, elbows, right knee, right hip and describes a left upper quadrant/abdomen pain.  We will get inflammatory lab studies listed below.  For recent fatigue will get CBC, CMP, thyroid studies, B12 and B1.  For recent right hip and right knee pain we will get x-rays of both areas.  If all lab studies negative consider possible fibromyalgia.  We will need to follow your atypical intermittent chest pain.  If that does not clear then will need to refer you back to cardiology.  If that worsens after hours or over the weekend then recommend ED evaluation.  Follow-up in 7 to 10 days or as needed.

## 2020-02-20 LAB — ANA: Anti Nuclear Antibody (ANA): NEGATIVE

## 2020-02-20 LAB — RHEUMATOID FACTOR: Rheumatoid fact SerPl-aCnc: <14 IU/mL (ref <14)

## 2020-02-22 LAB — VITAMIN B1: Vitamin B1 (Thiamine): <6 nmol/L — ABNORMAL LOW (ref 8–30)

## 2020-02-24 ENCOUNTER — Other Ambulatory Visit: Payer: Self-pay | Admitting: Medical

## 2020-02-24 ENCOUNTER — Encounter: Payer: Self-pay | Admitting: Medical

## 2020-02-24 ENCOUNTER — Telehealth: Payer: Self-pay | Admitting: Medical

## 2020-02-24 MED ORDER — PREGABALIN 75 MG PO CAPS: 75.0000 mg | ORAL_CAPSULE | Freq: Two times a day (BID) | ORAL | 0 refills | Status: DC

## 2020-02-24 MED FILL — PREGABALIN 75 MG CAPS: 75 | 30 days supply | Qty: 60 | Fill #0

## 2020-02-24 NOTE — Telephone Encounter (Signed)
Rx lyrica sent to pt pharmacy.

## 2020-02-26 ENCOUNTER — Other Ambulatory Visit (HOSPITAL_BASED_OUTPATIENT_CLINIC_OR_DEPARTMENT_OTHER): Payer: Self-pay | Admitting: Medical

## 2020-02-26 DIAGNOSIS — Z1231 Encounter for screening mammogram for malignant neoplasm of breast: Secondary | ICD-10-CM

## 2020-03-01 ENCOUNTER — Ambulatory Visit: Payer: BC Managed Care – PPO | Admitting: Medical

## 2020-03-01 DIAGNOSIS — L739 Follicular disorder, unspecified: Secondary | ICD-10-CM | POA: Diagnosis not present

## 2020-03-01 DIAGNOSIS — D1801 Hemangioma of skin and subcutaneous tissue: Secondary | ICD-10-CM | POA: Diagnosis not present

## 2020-03-31 ENCOUNTER — Inpatient Hospital Stay (HOSPITAL_BASED_OUTPATIENT_CLINIC_OR_DEPARTMENT_OTHER): Admission: RE | Admit: 2020-03-31 | Payer: BC Managed Care – PPO | Source: Ambulatory Visit

## 2020-03-31 DIAGNOSIS — Z1231 Encounter for screening mammogram for malignant neoplasm of breast: Secondary | ICD-10-CM

## 2020-04-08 ENCOUNTER — Encounter (HOSPITAL_BASED_OUTPATIENT_CLINIC_OR_DEPARTMENT_OTHER): Payer: BC Managed Care – PPO

## 2020-04-15 ENCOUNTER — Other Ambulatory Visit: Payer: Self-pay

## 2020-04-15 ENCOUNTER — Ambulatory Visit (HOSPITAL_BASED_OUTPATIENT_CLINIC_OR_DEPARTMENT_OTHER)
Admission: RE | Admit: 2020-04-15 | Discharge: 2020-04-15 | Disposition: A | Discharge status: Home / Self Care | Payer: BC Managed Care – PPO | Source: Ambulatory Visit | Attending: Medical | Admitting: Medical

## 2020-04-15 DIAGNOSIS — Z1231 Encounter for screening mammogram for malignant neoplasm of breast: Secondary | ICD-10-CM | POA: Insufficient documentation

## 2020-04-23 ENCOUNTER — Ambulatory Visit: Payer: BC Managed Care – PPO | Admitting: Medical

## 2020-04-23 ENCOUNTER — Other Ambulatory Visit: Payer: Self-pay

## 2020-04-23 VITALS — BP 135/75 | HR 72 | Resp 18 | Ht 67.0 in | Wt 236.4 lb

## 2020-04-23 DIAGNOSIS — R221 Localized swelling, mass and lump, neck: Secondary | ICD-10-CM

## 2020-04-23 DIAGNOSIS — F419 Anxiety disorder, unspecified: Secondary | ICD-10-CM

## 2020-04-23 DIAGNOSIS — M79602 Pain in left arm: Secondary | ICD-10-CM

## 2020-04-23 DIAGNOSIS — R1032 Left lower quadrant pain: Secondary | ICD-10-CM | POA: Diagnosis not present

## 2020-04-23 DIAGNOSIS — E010 Iodine-deficiency related diffuse (endemic) goiter: Secondary | ICD-10-CM

## 2020-04-23 DIAGNOSIS — E785 Hyperlipidemia, unspecified: Secondary | ICD-10-CM

## 2020-04-23 DIAGNOSIS — R0781 Pleurodynia: Secondary | ICD-10-CM

## 2020-04-23 LAB — COMPREHENSIVE METABOLIC PANEL
ALT: 13 U/L (ref 0–35)
AST: 10 U/L (ref 0–37)
Albumin: 4.2 g/dL (ref 3.5–5.2)
Alkaline Phosphatase: 67 U/L (ref 39–117)
BUN: 15 mg/dL (ref 6–23)
CO2: 29 mEq/L (ref 19–32)
Calcium: 9.6 mg/dL (ref 8.4–10.5)
Chloride: 102 mEq/L (ref 96–112)
Creatinine, Ser: 0.83 mg/dL (ref 0.40–1.20)
GFR: 84.78 mL/min (ref >60.00)
Glucose, Bld: 90 mg/dL (ref 70–99)
Potassium: 4.4 mEq/L (ref 3.5–5.1)
Sodium: 140 mEq/L (ref 135–145)
Total Bilirubin: 0.4 mg/dL (ref 0.2–1.2)
Total Protein: 7.1 g/dL (ref 6.0–8.3)

## 2020-04-23 LAB — T4, FREE: Free T4: 0.75 ng/dL (ref 0.60–1.60)

## 2020-04-23 LAB — CBC WITH DIFFERENTIAL/PLATELET
Basophils Absolute: 0.1 10*3/uL (ref 0.0–0.1)
Basophils Relative: 0.8 % (ref 0.0–3.0)
Eosinophils Absolute: 0.2 10*3/uL (ref 0.0–0.7)
Eosinophils Relative: 3 % (ref 0.0–5.0)
HCT: 42.2 % (ref 36.0–46.0)
Hemoglobin: 14.5 g/dL (ref 12.0–15.0)
Lymphocytes Relative: 30.2 % (ref 12.0–46.0)
Lymphs Abs: 2.4 10*3/uL (ref 0.7–4.0)
MCHC: 34.4 g/dL (ref 30.0–36.0)
MCV: 90.5 fl (ref 78.0–100.0)
Monocytes Absolute: 0.5 10*3/uL (ref 0.1–1.0)
Monocytes Relative: 6.2 % (ref 3.0–12.0)
Neutro Abs: 4.7 10*3/uL (ref 1.4–7.7)
Neutrophils Relative %: 59.8 % (ref 43.0–77.0)
Platelets: 254 10*3/uL (ref 150.0–400.0)
RBC: 4.66 Mil/uL (ref 3.87–5.11)
RDW: 12.7 % (ref 11.5–15.5)
WBC: 7.9 10*3/uL (ref 4.0–10.5)

## 2020-04-23 LAB — LIPID PANEL
Cholesterol: 181 mg/dL (ref 0–200)
HDL: 48.6 mg/dL (ref >39.00)
LDL Cholesterol: 100 mg/dL — ABNORMAL HIGH (ref 0–99)
NonHDL: 132.29
Total CHOL/HDL Ratio: 4
Triglycerides: 160 mg/dL — ABNORMAL HIGH (ref 0.0–149.0)
VLDL: 32 mg/dL (ref 0.0–40.0)

## 2020-04-23 LAB — TSH: TSH: 1.07 u[IU]/mL (ref 0.35–4.50)

## 2020-04-23 LAB — LIPASE: Lipase: 23 U/L (ref 11.0–59.0)

## 2020-04-23 MED ORDER — BUSPIRONE HCL 15 MG PO TABS: 15.0000 mg | ORAL_TABLET | Freq: Two times a day (BID) | ORAL | 0 refills | Status: DC

## 2020-04-23 MED ORDER — DOXYCYCLINE HYCLATE 100 MG PO TABS: 100.0000 mg | ORAL_TABLET | Freq: Two times a day (BID) | ORAL | 0 refills | Status: DC

## 2020-04-23 NOTE — Progress Notes (Signed)
Subjective:    Patient ID: Lisa Ellis, female    DOB: September 15, 1975, 45 y.o.   MRN: [id number]  HPI  Pt in with report of some pain left side of neck for one week. More at base of her neck. When turns head to left and downward more pain.  Pt also mentions some pain medial left bicep area. Pain runs down her left arm at times.   Pt stats a lot of stress recently. Close friend had a heart attack and not alert. Mom also has some blockage in lower ext. Husband reporting chest pain this morning and seeing his provider.  History of high cholesterol. Pt is fasting.  [place] also random intermittent brief pain that radiates from left lower rib/luq downward often noted at night if arm left lifted up and laying on left side. Work is also very stressful and feeling some anxious.   Review of Systems  Constitutional: Negative for chills and fatigue.  Respiratory: Negative for cough, chest tightness, shortness of breath and wheezing.   Cardiovascular: Negative for chest pain and palpitations.  Gastrointestinal: Negative for abdominal pain, blood in stool, constipation, diarrhea and nausea.  Genitourinary: Negative for dysuria.  Musculoskeletal: Negative for back pain and gait problem.  Neurological: Negative for dizziness and headaches.  Hematological: Negative for adenopathy. Does not bruise/bleed easily.  Psychiatric/Behavioral: Negative for behavioral problems, confusion, dysphoric mood and hallucinations. The patient is not nervous/anxious.     Past Medical History:  Diagnosis Date  . Allergy   . Asthma    Only wheezed on time associated with pneumonia. none since.  . Depression    only had brief episode in 2006 for couple of months when dad passed away.     Social History   Socioeconomic History  . Marital status: Married    Spouse name: Not on file  . Number of children: Not on file  . Years of education: Not on file  . Highest education level: Not on file  Occupational  History  . Not on file  Tobacco Use  . Smoking status: Never Smoker  . Smokeless tobacco: Never Used  Vaping Use  . Vaping Use: Never used  Substance and Sexual Activity  . Alcohol use: No    Alcohol/week: 0.0 standard drinks  . Drug use: No  . Sexual activity: Yes  Other Topics Concern  . Not on file  Social History Narrative  . Not on file   Social Determinants of Health   Financial Resource Strain: Not on file  Food Insecurity: Not on file  Transportation Needs: Not on file  Physical Activity: Not on file  Stress: Not on file  Social Connections: Not on file  Intimate Partner Violence: Not on file    Past Surgical History:  Procedure Laterality Date  . AUGMENTATION MAMMAPLASTY    . BREAST SURGERY     augmentation procedure.  . CHOLECYSTECTOMY    . NASAL SINUS SURGERY      Family History  Problem Relation Age of Onset  . Hypertension Mother   . Diabetes Father   . Hypertension Maternal Grandfather   . Diabetes Paternal Grandmother     No Known Allergies  Current Outpatient Medications on File Prior to Visit  Medication Sig Dispense Refill  . acetaminophen (TYLENOL) 500 MG tablet Take 1,000 mg by mouth every 6 (six) hours as needed for mild pain or fever. (Patient not taking: Reported on 12/18/2019)    . aspirin EC 81 MG tablet Take 1 tablet (  81 mg total) by mouth daily. 90 tablet 3  . azithromycin (ZITHROMAX) 250 MG tablet Take 2 tablets by mouth on day 1, followed by 1 tablet by mouth daily for 4 days. 6 tablet 0  . benzonatate (TESSALON) 100 MG capsule Take 1 capsule (100 mg total) by mouth every 8 (eight) hours. (Patient not taking: Reported on 12/18/2019) 21 capsule 0  . cyclobenzaprine (FLEXERIL) 5 MG tablet Take 1 tablet (5 mg total) by mouth at bedtime. 7 tablet 0  . dextromethorphan-guaiFENesin (MUCINEX DM) 30-600 MG 12hr tablet Take 2 tablets by mouth every 12 (twelve) hours as needed for cough.    Marland Kitchen HYDROcodone-homatropine (HYCODAN) 5-1.5 MG/5ML syrup  Take 5 mLs by mouth every 6 (six) hours as needed. 100 mL 0  . pregabalin (LYRICA) 75 MG capsule Take 1 capsule (75 mg total) by mouth 2 (two) times daily. 60 capsule 0   Current Facility-Administered Medications on File Prior to Visit  Medication Dose Route Frequency Provider Last Rate Last Admin  . 0.9 %  sodium chloride infusion   Intravenous Once Jaynee Eagles, PA-C        BP 135/75   Pulse 72   Resp 18   Ht 5\' 7"  (1.702 m)   Wt 236 lb 6.4 oz (107.2 kg)   SpO2 97%   BMI 37.03 kg/m       Objective:   Physical Exam  General Mental Status- Alert. General Appearance- Not in acute distress.   Skin General: Color- Normal Color. Moisture- Normal Moisture.  Neck Carotid Arteries- Normal color. Moisture- Normal Moisture. No carotid bruits. No JVD. On inspection pt does appear to have some degree of enlarged thyroid. Tender area base of neck and left of sternocleidomastoid. Faint reddish appearance base of left neck and mild pain.  Chest and Lung Exam Auscultation: Breath Sounds:-Normal.  Cardiovascular Auscultation:Rythm- Regular. Murmurs & Other Heart Sounds:Auscultation of the heart reveals- No Murmurs.  Abdomen Inspection:-Inspeection Normal. Palpation/Percussion:Note:No mass. Palpation and Percussion of the abdomen reveal- Non Tender, Non Distended + BS, no rebound or guarding.   Neurologic Cranial Nerve exam:- CN III-XII intact(No nystagmus), symmetric smile. Strength:- 5/5 equal and symmetric strength both upper and lower extremities.      Assessment & Plan:  History of hyperlipidemia.  Will get metabolic panel and lipid panel.  On exam it does appear that you have thyroid has some degree of enlargement and concern for possible neck mass base of left side of neck.  Placed order for thyroid ultrasound today and made note asking radiology department to look at base of left side neck.  If studies are negative then start doxycycline antibiotic as ear infection is in  differential diagnosis.  Left side area of neck is slightly pinkish-reddish in color with pain tenderness in region of concern.  For left arm pain ordered left upper extremity ultrasound to be done stat.  Want to rule out a DVT.  If that study is negative then expand work-up if pain persist.  Left upper quadrant abdomen pain/rib area pain.  Pain is random, intermittent and transient.  You describe often times associated with position changes.  Pain could be muscle pain.  As we discussed we will hold off on work-up presently.  But do update me if pain persists or signs and symptoms change or worsen.  Note did decide to add lipase to labs today as well as CBC as initial steps in work-up.  Note also note remote history of left-sided rib fracture.  Recent anxiety  with numerous family members sick and work stress.  Prescribe BuSpar 15 mg twice daily.  Rx advisement given.  Follow-up in 10 to 14 days or as needed.  [name], PA-C   Time spent with patient today was 40  minutes which consisted of chart review, discussing diagnosis, work upn treatment and documentation.

## 2020-04-23 NOTE — Patient Instructions (Addendum)
History of hyperlipidemia.  Will get metabolic panel and lipid panel.  On exam it does appear that you have thyroid has some degree of enlargement and concern for possible neck mass base of left side of neck.  Placed order for thyroid ultrasound today and made note asking radiology department to look at base of left side neck.  If studies are negative then start doxycycline antibiotic as ear infection is in differential diagnosis.  Left side area of neck is slightly pinkish-reddish in color with pain tenderness in region of concern.  For left arm pain ordered left upper extremity ultrasound to be done stat.  Want to rule out a DVT.  If that study is negative then expand work-up if pain persist.  Left upper quadrant abdomen pain/rib area pain.  Pain is random, intermittent and transient.  You describe often times associated with position changes.  Pain could be muscle pain.  As we discussed we will hold off on work-up presently.  But do update me if pain persists or signs and symptoms change or worsen.  Note did decide to add lipase to labs today as well as CBC as initial steps in work-up.  Note also note remote history of left-sided rib fracture.  Recent anxiety with numerous family members sick and work stress.  Prescribe BuSpar 15 mg twice daily.  Rx advisement given.  Follow-up in 10 to 14 days or as needed.

## 2020-04-24 ENCOUNTER — Encounter: Payer: Self-pay | Admitting: Medical

## 2020-04-24 ENCOUNTER — Ambulatory Visit (HOSPITAL_BASED_OUTPATIENT_CLINIC_OR_DEPARTMENT_OTHER)
Admission: RE | Admit: 2020-04-24 | Discharge: 2020-04-24 | Disposition: A | Discharge status: Home / Self Care | Payer: BC Managed Care – PPO | Source: Ambulatory Visit | Attending: Medical | Admitting: Medical

## 2020-04-24 DIAGNOSIS — M79602 Pain in left arm: Secondary | ICD-10-CM | POA: Diagnosis not present

## 2020-04-24 DIAGNOSIS — E010 Iodine-deficiency related diffuse (endemic) goiter: Secondary | ICD-10-CM | POA: Diagnosis not present

## 2020-04-24 DIAGNOSIS — R221 Localized swelling, mass and lump, neck: Secondary | ICD-10-CM | POA: Diagnosis not present

## 2020-04-24 DIAGNOSIS — E042 Nontoxic multinodular goiter: Secondary | ICD-10-CM | POA: Diagnosis not present

## 2020-05-18 ENCOUNTER — Other Ambulatory Visit: Payer: Self-pay | Admitting: Medical

## 2020-06-02 ENCOUNTER — Ambulatory Visit: Payer: BC Managed Care – PPO | Admitting: Medical

## 2020-06-22 ENCOUNTER — Ambulatory Visit: Payer: BC Managed Care – PPO | Admitting: Medical

## 2020-06-30 ENCOUNTER — Ambulatory Visit: Payer: BC Managed Care – PPO | Admitting: Medical

## 2020-06-30 ENCOUNTER — Other Ambulatory Visit: Payer: Self-pay

## 2020-06-30 ENCOUNTER — Ambulatory Visit (HOSPITAL_BASED_OUTPATIENT_CLINIC_OR_DEPARTMENT_OTHER)
Admission: RE | Admit: 2020-06-30 | Discharge: 2020-06-30 | Disposition: A | Discharge status: Home / Self Care | Payer: BC Managed Care – PPO | Source: Ambulatory Visit | Attending: Medical | Admitting: Medical

## 2020-06-30 ENCOUNTER — Encounter: Payer: Self-pay | Admitting: Medical

## 2020-06-30 VITALS — BP 130/79 | HR 84 | Resp 16 | Ht 67.0 in | Wt 236.8 lb

## 2020-06-30 DIAGNOSIS — Z1211 Encounter for screening for malignant neoplasm of colon: Secondary | ICD-10-CM

## 2020-06-30 DIAGNOSIS — G8929 Other chronic pain: Secondary | ICD-10-CM | POA: Diagnosis not present

## 2020-06-30 DIAGNOSIS — R109 Unspecified abdominal pain: Secondary | ICD-10-CM | POA: Diagnosis not present

## 2020-06-30 DIAGNOSIS — M546 Pain in thoracic spine: Secondary | ICD-10-CM | POA: Diagnosis not present

## 2020-06-30 DIAGNOSIS — R350 Frequency of micturition: Secondary | ICD-10-CM | POA: Diagnosis not present

## 2020-06-30 LAB — CBC WITH DIFFERENTIAL/PLATELET
Basophils Absolute: 0.1 10*3/uL (ref 0.0–0.1)
Basophils Relative: 1 % (ref 0.0–3.0)
Eosinophils Absolute: 0.3 10*3/uL (ref 0.0–0.7)
Eosinophils Relative: 2.8 % (ref 0.0–5.0)
HCT: 42.5 % (ref 36.0–46.0)
Hemoglobin: 14.7 g/dL (ref 12.0–15.0)
Lymphocytes Relative: 32.2 % (ref 12.0–46.0)
Lymphs Abs: 3 10*3/uL (ref 0.7–4.0)
MCHC: 34.5 g/dL (ref 30.0–36.0)
MCV: 90.6 fl (ref 78.0–100.0)
Monocytes Absolute: 0.6 10*3/uL (ref 0.1–1.0)
Monocytes Relative: 6.5 % (ref 3.0–12.0)
Neutro Abs: 5.4 10*3/uL (ref 1.4–7.7)
Neutrophils Relative %: 57.5 % (ref 43.0–77.0)
Platelets: 308 10*3/uL (ref 150.0–400.0)
RBC: 4.69 Mil/uL (ref 3.87–5.11)
RDW: 12.5 % (ref 11.5–15.5)
WBC: 9.3 10*3/uL (ref 4.0–10.5)

## 2020-06-30 LAB — COMPREHENSIVE METABOLIC PANEL
ALT: 20 U/L (ref 0–35)
AST: 15 U/L (ref 0–37)
Albumin: 4.1 g/dL (ref 3.5–5.2)
Alkaline Phosphatase: 79 U/L (ref 39–117)
BUN: 13 mg/dL (ref 6–23)
CO2: 27 mEq/L (ref 19–32)
Calcium: 9.6 mg/dL (ref 8.4–10.5)
Chloride: 102 mEq/L (ref 96–112)
Creatinine, Ser: 0.88 mg/dL (ref 0.40–1.20)
GFR: 78.93 mL/min (ref >60.00)
Glucose, Bld: 87 mg/dL (ref 70–99)
Potassium: 4.4 mEq/L (ref 3.5–5.1)
Sodium: 139 mEq/L (ref 135–145)
Total Bilirubin: 0.5 mg/dL (ref 0.2–1.2)
Total Protein: 6.9 g/dL (ref 6.0–8.3)

## 2020-06-30 LAB — POC URINALSYSI DIPSTICK (AUTOMATED)
Bilirubin, UA: NEGATIVE
Glucose, UA: NEGATIVE
Leukocytes, UA: NEGATIVE
Nitrite, UA: NEGATIVE
Protein, UA: POSITIVE — AB
Spec Grav, UA: >=1.03 — AB (ref 1.010–1.025)
Urobilinogen, UA: 0.2 E.U./dL
pH, UA: 5 (ref 5.0–8.0)

## 2020-06-30 LAB — LIPASE: Lipase: 20 U/L (ref 11.0–59.0)

## 2020-06-30 MED ORDER — METRONIDAZOLE 500 MG PO TABS: 500.0000 mg | ORAL_TABLET | Freq: Three times a day (TID) | ORAL | 0 refills | Status: DC

## 2020-06-30 MED ORDER — FAMOTIDINE 20 MG PO TABS: 20.0000 mg | ORAL_TABLET | Freq: Two times a day (BID) | ORAL | 1 refills | Status: DC

## 2020-06-30 MED ORDER — CIPROFLOXACIN HCL 500 MG PO TABS: 500.0000 mg | ORAL_TABLET | Freq: Two times a day (BID) | ORAL | 0 refills | Status: DC

## 2020-06-30 NOTE — Patient Instructions (Addendum)
Recent epigastric and left upper quadrant region pain in the past 2 to 3 weeks.  We will treat for possible GERD with famotidine 20 mg twice daily.  Recommend eating bland healthy diet.  On review H. pylori breath test 2020 was negative.  Also note history of cholecystectomy  We will go ahead and get CBC, CMP and lipase today.  In addition.  Onset lower abdomen area pain since yesterday.  Some frequent urination as well.  On exam pain in left lower quadrant most prominent with some minimal pain right lower quadrant.  We will get POCT urine, urine culture and prescribe Cipro antibiotic and Flagyl antibiotic.  Follow-up culture results but if pain worsens despite antibiotic use then would recommend CT abdomen and pelvis to evaluate for diverticulitis.  Also will get information regarding ovarian cyst which were seen on last CT abdomen in September.  Some chronic thoracic region pain over the years.  We will go ahead and get T-spine x-ray today.  Follow-up in 7 to 10 days or as needed.  Also on review I do not see the patient's had a colonoscopy.  We will plan for him for screening for colon cancer purposes.

## 2020-06-30 NOTE — Progress Notes (Signed)
Subjective:    Patient ID: Lisa Ellis, female    DOB: 11/13/1975, 45 y.o.   MRN: [id number]  HPI  Pt in for some pain for 2-3 weeks. Pain is epigastric, lower left rib and some pain scapula areas. Pain is all the time. Eating does not make it worse per pt. Pain epigastric to left upper quadrant daily. The scapula pain more thoracic and chronic before recent abd pain per pt. State back pain for year. t spine xray in 2017 done.   Pt mentioned also some llq pain yesterday. Some frequent urination yesterday. Pt CT abdomen in sept 21 showed.   IMPRESSION: Airspace opacities in the lung bases consistent with the given clinical history of COVID-19 positivity.  Fatty liver.  Diverticulosis without diverticulitis.  Bilateral ovarian cystic change right greater than left.   No recent chest pain. Though hx of atypical chest pain with referal to cardiologist in 2019. Pt had dx of costochondritis and echo stress test done. Test was negative in 2019.   Last menses- started on April 9 ended on 17 th.  Pt cxr in 12-2019 showed.  IMPRESSION: Bilateral linear opacities may be due to residual pneumonia or post COVID scarring.     Review of Systems  Constitutional: Negative for chills and fatigue.  Respiratory: Negative for cough, choking, shortness of breath and wheezing.   Cardiovascular: Negative for chest pain and palpitations.  Gastrointestinal: Positive for abdominal pain. Negative for abdominal distention, constipation, diarrhea, nausea and vomiting.  Genitourinary: Positive for frequency. Negative for decreased urine volume, difficulty urinating, dyspareunia, flank pain, urgency and vaginal pain.  Musculoskeletal: Positive for back pain. Negative for neck pain.  Skin: Negative for rash.  Neurological: Negative for dizziness, speech difficulty, weakness and light-headedness.  Hematological: Negative for adenopathy. Does not bruise/bleed easily.  Psychiatric/Behavioral: Negative  for behavioral problems.    Past Medical History:  Diagnosis Date  . Allergy   . Asthma    Only wheezed on time associated with pneumonia. none since.  . Depression    only had brief episode in 2006 for couple of months when dad passed away.     Social History   Socioeconomic History  . Marital status: Married    Spouse name: Not on file  . Number of children: Not on file  . Years of education: Not on file  . Highest education level: Not on file  Occupational History  . Not on file  Tobacco Use  . Smoking status: Never Smoker  . Smokeless tobacco: Never Used  Vaping Use  . Vaping Use: Never used  Substance and Sexual Activity  . Alcohol use: No    Alcohol/week: 0.0 standard drinks  . Drug use: No  . Sexual activity: Yes  Other Topics Concern  . Not on file  Social History Narrative  . Not on file   Social Determinants of Health   Financial Resource Strain: Not on file  Food Insecurity: Not on file  Transportation Needs: Not on file  Physical Activity: Not on file  Stress: Not on file  Social Connections: Not on file  Intimate Partner Violence: Not on file    Past Surgical History:  Procedure Laterality Date  . AUGMENTATION MAMMAPLASTY    . BREAST SURGERY     augmentation procedure.  . CHOLECYSTECTOMY    . NASAL SINUS SURGERY      Family History  Problem Relation Age of Onset  . Hypertension Mother   . Diabetes Father   . Hypertension  Maternal Grandfather   . Diabetes Paternal Grandmother     No Known Allergies  Current Outpatient Medications on File Prior to Visit  Medication Sig Dispense Refill  . aspirin EC 81 MG tablet Take 1 tablet (81 mg total) by mouth daily. 90 tablet 3  . busPIRone (BUSPAR) 15 MG tablet TAKE 1 TABLET BY MOUTH 2 TIMES DAILY. 180 tablet 1  . cyclobenzaprine (FLEXERIL) 5 MG tablet Take 1 tablet (5 mg total) by mouth at bedtime. 7 tablet 0  . acetaminophen (TYLENOL) 500 MG tablet Take 1,000 mg by mouth every 6 (six) hours as  needed for mild pain or fever. (Patient not taking: Reported on 12/18/2019)    . benzonatate (TESSALON) 100 MG capsule Take 1 capsule (100 mg total) by mouth every 8 (eight) hours. (Patient not taking: Reported on 12/18/2019) 21 capsule 0  . dextromethorphan-guaiFENesin (MUCINEX DM) 30-600 MG 12hr tablet Take 2 tablets by mouth every 12 (twelve) hours as needed for cough.    . [DISCONTINUED] pregabalin (LYRICA) 75 MG capsule Take 1 capsule (75 mg total) by mouth 2 (two) times daily. 60 capsule 0   Current Facility-Administered Medications on File Prior to Visit  Medication Dose Route Frequency Provider Last Rate Last Admin  . 0.9 %  sodium chloride infusion   Intravenous Once Jaynee Eagles, PA-C        BP 130/79   Pulse 84   Resp 16   Ht 5\' 7"  (1.702 m)   Wt 236 lb 12.8 oz (107.4 kg)   SpO2 98%   BMI 37.09 kg/m      Objective:   Physical Exam   General Appearance- Not in acute distress.  HEENT Eyes- Scleraeral/Conjuntiva-bilat- Not Yellow. Mouth & Throat- Normal.  Chest and Lung Exam Auscultation: Breath sounds:-Normal. Adventitious sounds:- No Adventitious sounds.  Cardiovascular Auscultation:Rythm - Regular. Heart Sounds -Normal heart sounds.  Abdomen Inspection:-Inspection Normal.  Palpation/Perucssion: Palpation and Percussion of the abdomen reveal-moderate tender left lower quadrant, very faint right lower quadrant tender, No Rebound tenderness, No rigidity(Guarding) and No Palpable abdominal masses.  Liver:-Normal.  Spleen:- Normal.   Back-no CVA tenderness. T-spine faint upper mid T-spine and  Spinal tenderness palpation.       Assessment & Plan:  Recent epigastric and left upper quadrant region pain in the past 2 to 3 weeks.  We will treat for possible GERD with famotidine 20 mg twice daily.  Recommend eating bland healthy diet.  On review H. pylori breath test 2020 was negative.  Also note history of cholecystectomy  We will go ahead and get CBC, CMP and  lipase today.  In addition.  Onset lower abdomen area pain since yesterday.  Some frequent urination as well.  On exam pain in left lower quadrant most prominent with some minimal pain right lower quadrant.  We will get POCT urine, urine culture and prescribe Cipro antibiotic and Flagyl antibiotic.  Follow-up culture results but if pain worsens despite antibiotic use then would recommend CT abdomen and pelvis to evaluate for diverticulitis.  Also will get information regarding ovarian cyst which were seen on last CT abdomen in September.  Some chronic thoracic region pain over the years.  We will go ahead and get T-spine x-ray today.  Follow-up in 7 to 10 days or as needed.

## 2020-07-02 LAB — URINE CULTURE
MICRO NUMBER:: 11792261
SPECIMEN QUALITY:: ADEQUATE

## 2020-07-16 ENCOUNTER — Encounter: Payer: Self-pay | Admitting: Gastroenterology

## 2020-07-22 ENCOUNTER — Other Ambulatory Visit: Payer: Self-pay | Admitting: Medical

## 2020-07-23 ENCOUNTER — Other Ambulatory Visit: Payer: Self-pay

## 2020-07-23 ENCOUNTER — Ambulatory Visit (AMBULATORY_SURGERY_CENTER): Payer: Self-pay | Admitting: *Deleted

## 2020-07-23 VITALS — Ht 67.0 in | Wt 234.8 lb

## 2020-07-23 DIAGNOSIS — Z1211 Encounter for screening for malignant neoplasm of colon: Secondary | ICD-10-CM

## 2020-07-23 MED ORDER — ONDANSETRON HCL 4 MG PO TABS: ORAL_TABLET | ORAL | 0 refills | Status: DC

## 2020-07-23 NOTE — Progress Notes (Signed)
  No trouble with anesthesia, denies being told they were difficult to intubate, or hx/fam hx of malignant hyperthermia per pt   No egg or soy allergy  No home oxygen use   No medications for weight loss taken  emmi information given  Pt denies constipation issues  Plenvu sample given and Zofran rx sent to pharmacy

## 2020-07-25 ENCOUNTER — Encounter: Payer: Self-pay | Admitting: Medical

## 2020-07-30 ENCOUNTER — Encounter: Payer: Self-pay | Admitting: Gastroenterology

## 2020-08-01 ENCOUNTER — Encounter: Payer: Self-pay | Admitting: Medical

## 2020-08-03 ENCOUNTER — Telehealth: Payer: BC Managed Care – PPO | Admitting: Family Medicine

## 2020-08-03 DIAGNOSIS — R0981 Nasal congestion: Secondary | ICD-10-CM | POA: Diagnosis not present

## 2020-08-03 DIAGNOSIS — R519 Headache, unspecified: Secondary | ICD-10-CM | POA: Diagnosis not present

## 2020-08-03 MED ORDER — DOXYCYCLINE HYCLATE 100 MG PO TABS: 100.0000 mg | ORAL_TABLET | Freq: Two times a day (BID) | ORAL | 0 refills | Status: DC

## 2020-08-03 NOTE — Patient Instructions (Addendum)
  HOME CARE TIPS:  -[place] testing information: https://www.[facility].org/ OR 820-299-5798 Most pharmacies also offer testing and home test kits. If the Covid19 test is positive, please make a prompt follow up visit with your primary care office or with [facility] to discuss treatment options. Treatments for Covid19 are best given early in the course of the illness.   -I sent the medication(s) we discussed to your pharmacy: Meds ordered this encounter  Medications  . doxycycline (VIBRA-TABS) 100 MG tablet    Sig: Take 1 tablet (100 mg total) by mouth 2 (two) times daily.    Dispense:  20 tablet    Refill:  0    -can start an allergy pill such as allegra or zyrtec once daily during the allergy season  -can use nasal saline a few times per day if you have nasal congestion; sometimes  a short course of Afrin nasal spray for 3 days can help with symptoms as well  -stay hydrated, drink plenty of fluids and eat small healthy meals - avoid dairy  -can take 1000 IU (30mcg) Vit D3 and 100-500 mg of Vit C daily per instructions  -If the Covid test is positive, check out the [facility] website for more information on home care, transmission and treatment for COVID19  -follow up with your doctor in 2-3 days unless improving and feeling better  -stay home while sick, except to seek medical care. If you have COVID19, ideally it would be best to stay home for a full 10 days since the onset of symptoms PLUS one day of no fever and feeling better. Wear a good mask that fits snugly (such as N95 or KN95) if around others to reduce the risk of transmission.  It was nice to meet you today, and I really hope you are feeling better soon. I help [place] out with telemedicine visits on Tuesdays and Thursdays and am available for visits on those days. If you have any concerns or questions following this visit please schedule a follow up visit with your Primary Care doctor or  seek care at a local urgent care clinic to avoid delays in care.    Seek in person care or schedule a follow up video visit promptly if your symptoms worsen, new concerns arise or you are not improving with treatment. Call 911 and/or seek emergency care if your symptoms are severe or life threatening.

## 2020-08-03 NOTE — Progress Notes (Signed)
Virtual Visit via Video Note  I connected with Lisa Ellis  on 08/03/20 at  1:20 PM EDT by a video enabled telemedicine application and verified that I am speaking with the correct person using two identifiers.  Location patient: home, [place] Location provider:work or home office Persons participating in the virtual visit: patient, provider  I discussed the limitations of evaluation and management by telemedicine and the availability of in person appointments. The patient expressed understanding and agreed to proceed.   HPI:  Acute telemedicine visit for Sinus Issues: -Onset: has allergies so has chronic issues, but worse the last 4-5 days -Symptoms include: worsening sinus congestion, maxillary sinus pain L > R, thick green to yellow sinus drainage -Denies: fevers, NVD, CP, SOB, body aches, inability to eat/drink/get out of bed -Has tried: has been taking musinex -Pertinent past medical history: hx of allergies and sinus issues, did have sinus surgery in 2017 - has had less infections since then -Pertinent medication allergies: No Known Allergies  -patient denies any chance of preganacy -COVID-19 vaccine status: not vaccinated, had covid in september  ROS: See pertinent positives and negatives per HPI.  Past Medical History:  Diagnosis Date  . Allergy   . Asthma    Only wheezed on time associated with pneumonia. none since. temporary only  . COVID    September 2021  . Depression    only had brief episode in 2006 for couple of months when dad passed away.    Past Surgical History:  Procedure Laterality Date  . AUGMENTATION MAMMAPLASTY    . BREAST SURGERY     augmentation procedure.  . CHOLECYSTECTOMY    . NASAL SINUS SURGERY       Current Outpatient Medications:  .  doxycycline (VIBRA-TABS) 100 MG tablet, Take 1 tablet (100 mg total) by mouth 2 (two) times daily., Disp: 20 tablet, Rfl: 0 .  acetaminophen (TYLENOL) 500 MG tablet, Take 1,000 mg by mouth every 6 (six) hours as  needed for mild pain or fever. (Patient not taking: Reported on 12/18/2019), Disp: , Rfl:  .  aspirin EC 81 MG tablet, Take 1 tablet (81 mg total) by mouth daily., Disp: 90 tablet, Rfl: 3 .  benzonatate (TESSALON) 100 MG capsule, Take 1 capsule (100 mg total) by mouth every 8 (eight) hours. (Patient not taking: Reported on 12/18/2019), Disp: 21 capsule, Rfl: 0 .  busPIRone (BUSPAR) 15 MG tablet, TAKE 1 TABLET BY MOUTH 2 TIMES DAILY. (Patient not taking: Reported on 07/23/2020), Disp: 180 tablet, Rfl: 1 .  cyclobenzaprine (FLEXERIL) 5 MG tablet, Take 1 tablet (5 mg total) by mouth at bedtime., Disp: 7 tablet, Rfl: 0 .  dextromethorphan-guaiFENesin (MUCINEX DM) 30-600 MG 12hr tablet, Take 2 tablets by mouth every 12 (twelve) hours as needed for cough., Disp: , Rfl:  .  famotidine (PEPCID) 20 MG tablet, TAKE 1 TABLET BY MOUTH TWICE A DAY, Disp: 60 tablet, Rfl: 1 .  ondansetron (ZOFRAN) 4 MG tablet, Take per colonoscopy prep instructions, Disp: 2 tablet, Rfl: 0  Current Facility-Administered Medications:  .  0.9 %  sodium chloride infusion, , Intravenous, Once, [place], [place], PA-C  EXAM:  VITALS per patient if applicable:  GENERAL: alert, oriented, appears well and in no acute distress  HEENT: atraumatic, conjunttiva clear, no obvious abnormalities on inspection of external nose and ears  NECK: normal movements of the head and neck  LUNGS: on inspection no signs of respiratory distress, breathing rate appears normal, no obvious gross SOB, gasping or wheezing  CV:  no obvious cyanosis  MS: moves all visible extremities without noticeable abnormality  PSYCH/NEURO: pleasant and cooperative, no obvious depression or anxiety, speech and thought processing grossly intact  ASSESSMENT AND PLAN:  Discussed the following assessment and plan:  Sinus congestion  Facial discomfort  -we discussed possible serious and likely etiologies, options for evaluation and workup, limitations of telemedicine  visit vs in person visit, treatment, treatment risks and precautions. Pt prefers to treat via telemedicine empirically rather than in person at this moment.  Query VURI, COVID19, possible sinusitis vs other. Advised covid testing, nasal saline and short course of nasal decongestant with abx if worsening or not improving with these measures. Advised if covid test positive and she wished to consider EUA treatment options to contact her primary care office promptly for a follow up visit - did discuss treatment window.  Work/School slipped offered:  declined Scheduled follow up with PCP offered: as needed Advised to seek prompt in person care if worsening, new symptoms arise, or if is not improving with treatment. Discussed options for inperson care if PCP office not available. Did let this patient know that I only do telemedicine on Tuesdays and Thursdays for [place]. Advised to schedule follow up visit with PCP or [facility] if any further questions or concerns to avoid delays in care.   I discussed the assessment and treatment plan with the patient. The patient was provided an opportunity to ask questions and all were answered. The patient agreed with the plan and demonstrated an understanding of the instructions.     Lucretia Kern, DO

## 2020-08-04 ENCOUNTER — Telehealth: Payer: Self-pay | Admitting: Gastroenterology

## 2020-08-04 NOTE — Telephone Encounter (Signed)
Good morning Dr. Fuller Plan, patient called stating they are not feeling well so they rescheduled their procedure from 08/05/20 to 09/17/20.

## 2020-08-04 NOTE — Telephone Encounter (Signed)
OK. No charge for [place] cancellation this time.

## 2020-08-05 ENCOUNTER — Encounter: Payer: BC Managed Care – PPO | Admitting: Gastroenterology

## 2020-08-22 ENCOUNTER — Other Ambulatory Visit: Payer: Self-pay | Admitting: Medical

## 2020-09-06 ENCOUNTER — Other Ambulatory Visit: Payer: Self-pay | Admitting: Medical

## 2020-09-17 ENCOUNTER — Other Ambulatory Visit: Payer: Self-pay

## 2020-09-17 ENCOUNTER — Encounter: Payer: Self-pay | Admitting: Gastroenterology

## 2020-09-17 ENCOUNTER — Ambulatory Visit (AMBULATORY_SURGERY_CENTER): Payer: BC Managed Care – PPO | Admitting: Gastroenterology

## 2020-09-17 VITALS — BP 108/72 | HR 74 | Temp 98.4°F | Resp 20 | Ht 67.0 in | Wt 234.8 lb

## 2020-09-17 DIAGNOSIS — D123 Benign neoplasm of transverse colon: Secondary | ICD-10-CM

## 2020-09-17 DIAGNOSIS — D124 Benign neoplasm of descending colon: Secondary | ICD-10-CM

## 2020-09-17 DIAGNOSIS — Z1211 Encounter for screening for malignant neoplasm of colon: Secondary | ICD-10-CM | POA: Diagnosis not present

## 2020-09-17 MED ORDER — SODIUM CHLORIDE 0.9 % IV SOLN
500.0000 mL | Freq: Once | INTRAVENOUS | Status: DC
Start: 2020-09-17 — End: 2020-09-17

## 2020-09-17 NOTE — Op Note (Signed)
[facility] Patient Name: Lisa Ellis Procedure Date: 09/17/2020 11:36 AM MRN: [id number] Endoscopist: Ladene Artist , MD Age: 45 Referring MD:  Date of Birth: 30-Dec-1975 Gender: Female Account #: [id number] Procedure:                Colonoscopy Indications:              Screening for colorectal malignant neoplasm Medicines:                Monitored Anesthesia Care Procedure:                Pre-Anesthesia Assessment:                           - Prior to the procedure, a History and Physical                            was performed, and patient medications and                            allergies were reviewed. The patient's tolerance of                            previous anesthesia was also reviewed. The risks                            and benefits of the procedure and the sedation                            options and risks were discussed with the patient.                            All questions were answered, and informed consent                            was obtained. Prior Anticoagulants: The patient has                            taken no previous anticoagulant or antiplatelet                            agents. ASA Grade Assessment: II - A patient with                            mild systemic disease. After reviewing the risks                            and benefits, the patient was deemed in                            satisfactory condition to undergo the procedure.                           After obtaining informed consent, the colonoscope  was passed under direct vision. Throughout the                            procedure, the patient's blood pressure, pulse, and                            oxygen saturations were monitored continuously. The                            CF [id number] #[id number] was introduced through the anus                            and advanced to the the cecum, identified by                            appendiceal orifice  and ileocecal valve. The                            ileocecal valve, appendiceal orifice, and rectum                            were photographed. The quality of the bowel                            preparation was excellent. The colonoscopy was                            performed without difficulty. The patient tolerated                            the procedure well. Scope In: 11:40:45 AM Scope Out: 11:57:24 AM Scope Withdrawal Time: 0 hours 14 minutes 19 seconds  Total Procedure Duration: 0 hours 16 minutes 39 seconds  Findings:                 The perianal and digital rectal examinations were                            normal.                           Three sessile polyps were found in the descending                            colon (1) and transverse colon (2). The polyps were                            7 to 8 mm in size. These polyps were removed with a                            cold snare. Resection and retrieval were complete.                           The exam was otherwise without abnormality on  direct and retroflexion views. Complications:            No immediate complications. Estimated blood loss:                            None. Estimated Blood Loss:     Estimated blood loss: none. Impression:               - Three 7 to 8 mm polyps in the descending colon                            and in the transverse colon.                           - The examination was otherwise normal on direct                            and retroflexion views. Recommendation:           - Repeat colonoscopy after studies are complete for                            surveillance based on pathology results.                           - Patient has a contact number available for                            emergencies. The signs and symptoms of potential                            delayed complications were discussed with the                            patient. Return to normal  activities tomorrow.                            Written discharge instructions were provided to the                            patient.                           - Resume previous diet.                           - Continue present medications.                           - Await pathology results. Ladene Artist, MD 09/17/2020 12:00:53 PM This report has been signed electronically.

## 2020-09-17 NOTE — Progress Notes (Signed)
Medical history reviewed with no changes noted. VS assessed by C.W 

## 2020-09-17 NOTE — Progress Notes (Signed)
pt tolerated well. VSS. awake and to recovery. Report given to RN.  

## 2020-09-17 NOTE — Progress Notes (Signed)
Called to room to assist during endoscopic procedure.  Patient ID and intended procedure confirmed with present staff. Received instructions for my participation in the procedure from the performing physician.  

## 2020-09-17 NOTE — Patient Instructions (Signed)
YOU HAD AN ENDOSCOPIC PROCEDURE TODAY AT THE [facility]:   Refer to the procedure report that was given to you for any specific questions about what was found during the examination.  If the procedure report does not answer your questions, please call your gastroenterologist to clarify.  If you requested that your care partner not be given the details of your procedure findings, then the procedure report has been included in a sealed envelope for you to review at your convenience later.  YOU SHOULD EXPECT: Some feelings of bloating in the abdomen. Passage of more gas than usual.  Walking can help get rid of the air that was put into your GI tract during the procedure and reduce the bloating. If you had a lower endoscopy (such as a colonoscopy or flexible sigmoidoscopy) you may notice spotting of blood in your stool or on the toilet paper. If you underwent a bowel prep for your procedure, you may not have a normal bowel movement for a few days.  Please Note:  You might notice some irritation and congestion in your nose or some drainage.  This is from the oxygen used during your procedure.  There is no need for concern and it should clear up in a day or so.  SYMPTOMS TO REPORT IMMEDIATELY:   Following lower endoscopy (colonoscopy or flexible sigmoidoscopy):  Excessive amounts of blood in the stool  Significant tenderness or worsening of abdominal pains  Swelling of the abdomen that is new, acute  Fever of 100F or higher  For urgent or emergent issues, a gastroenterologist can be reached at any hour by calling [phone]. Do not use MyChart messaging for urgent concerns.    DIET:  We do recommend a small meal at first, but then you may proceed to your regular diet.  Drink plenty of fluids but you should avoid alcoholic beverages for 24 hours.  ACTIVITY:  You should plan to take it easy for the rest of today and you should NOT DRIVE or use heavy machinery until tomorrow (because  of the sedation medicines used during the test).    FOLLOW UP: Our staff will call the number listed on your records 48-72 hours following your procedure to check on you and address any questions or concerns that you may have regarding the information given to you following your procedure. If we do not reach you, we will leave a message.  We will attempt to reach you two times.  During this call, we will ask if you have developed any symptoms of COVID 19. If you develop any symptoms (ie: fever, flu-like symptoms, shortness of breath, cough etc.) before then, please call [phone].  If you test positive for Covid 19 in the 2 weeks post procedure, please call and report this information to us.    If any biopsies were taken you will be contacted by phone or by letter within the next 1-3 weeks.  Please call us at [phone] if you have not heard about the biopsies in 3 weeks.    SIGNATURES/CONFIDENTIALITY: You and/or your care partner have signed paperwork which will be entered into your electronic medical record.  These signatures attest to the fact that that the information above on your After Visit Summary has been reviewed and is understood.  Full responsibility of the confidentiality of this discharge information lies with you and/or your care-partner. 

## 2020-09-21 ENCOUNTER — Telehealth: Payer: Self-pay

## 2020-09-21 NOTE — Telephone Encounter (Signed)
  Follow up Call-  Call back number 09/17/2020  Post procedure Call Back phone  # [phone]  Permission to leave phone message Yes  Some recent data might be hidden     Patient questions:  Do you have a fever, pain , or abdominal swelling? Yes.   Pain Score  0 *  Have you tolerated food without any problems? Yes.    Have you been able to return to your normal activities? Yes.    Do you have any questions about your discharge instructions: Diet   No. Medications  No. Follow up visit  No.  Do you have questions or concerns about your Care? No.  Actions: * If pain score is 4 or above: No action needed, pain <4.  Have you developed a fever since your procedure? No  2.   Have you had an respiratory symptoms (SOB or cough) since your procedure? No   3.   Have you tested positive for COVID 19 since your procedure no   4.   Have you had any family members/close contacts diagnosed with the COVID 19 since your procedure?  No    If yes to any of these questions please route to Joylene John, RN and Joella Prince, RN

## 2020-09-27 ENCOUNTER — Encounter: Payer: Self-pay | Admitting: Gastroenterology

## 2020-10-04 ENCOUNTER — Encounter: Payer: Self-pay | Admitting: Medical

## 2020-10-04 ENCOUNTER — Telehealth: Payer: Self-pay | Admitting: Medical

## 2020-10-04 NOTE — Telephone Encounter (Signed)
Pt sent my chart message. She has covid. Will you give her virtual visit wednesdy. Currently have 2 virutal visit slots.   If other office has virtual slot open tomorrow can she be seen sooner.

## 2020-10-04 NOTE — Telephone Encounter (Signed)
Patient has appt with Dr. Maudie Mercury tomorrow.

## 2020-10-05 ENCOUNTER — Encounter: Payer: Self-pay | Admitting: Family Medicine

## 2020-10-05 ENCOUNTER — Telehealth: Payer: BC Managed Care – PPO | Admitting: Family Medicine

## 2020-10-05 DIAGNOSIS — U071 COVID-19: Secondary | ICD-10-CM | POA: Diagnosis not present

## 2020-10-05 MED ORDER — BENZONATATE 100 MG PO CAPS: 200.0000 mg | ORAL_CAPSULE | Freq: Three times a day (TID) | ORAL | 0 refills | Status: DC | PRN

## 2020-10-05 NOTE — Progress Notes (Signed)
Virtual Visit via Video Note  I connected with Lisa Ellis  on 10/05/20 at 11:00 AM EDT by a video enabled telemedicine application and verified that I am speaking with the correct person using two identifiers.  Location patient: home, [place] Location provider:work or home office Persons participating in the virtual visit: patient, provider  I discussed the limitations of evaluation and management by telemedicine and the availability of in person appointments. The patient expressed understanding and agreed to proceed.   HPI:  Acute telemedicine visit for Covid19: -Onset: 4-5 days ago; at home covid test was positive -Symptoms include: nasal congestion, cough -she is feeling better today -Denies:fever, CP, SOB, inability to eat/drink/get out of bed, vomiting, diarrhea -O2 has been > 97% -Pertinent past medical history: see below - asthma was mild intermittent and years ago - no inhalers since -Pertinent medication allergies:No Known Allergies -COVID-19 vaccine status: not vaccinated -pregnancy status: denies any chance of pregnancy -had labs in April with GFR 78  ROS: See pertinent positives and negatives per HPI.  Past Medical History:  Diagnosis Date   Allergy    Asthma    Only wheezed on time associated with pneumonia. none since. temporary only   COVID    September 2021   Depression    only had brief episode in 2006 for couple of months when dad passed away.    Past Surgical History:  Procedure Laterality Date   AUGMENTATION MAMMAPLASTY     BREAST SURGERY     augmentation procedure.   CHOLECYSTECTOMY     NASAL SINUS SURGERY       Current Outpatient Medications:    aspirin EC 81 MG tablet, Take 1 tablet (81 mg total) by mouth daily., Disp: 90 tablet, Rfl: 3   benzonatate (TESSALON PERLES) 100 MG capsule, Take 2 capsules (200 mg total) by mouth 3 (three) times daily as needed., Disp: 20 capsule, Rfl: 0  Current Facility-Administered Medications:    0.9 %  sodium chloride  infusion, , Intravenous, Once, [place], [place], PA-C  EXAM:  VITALS per patient if applicable: -[id number]; P 90; O2 >97% on RA, T 97.1  GENERAL: alert, oriented, appears well and in no acute distress  HEENT: atraumatic, conjunttiva clear, no obvious abnormalities on inspection of external nose and ears  NECK: normal movements of the head and neck  LUNGS: on inspection no signs of respiratory distress, breathing rate appears normal, no obvious gross SOB, gasping or wheezing  CV: no obvious cyanosis  MS: moves all visible extremities without noticeable abnormality  PSYCH/NEURO: pleasant and cooperative, no obvious depression or anxiety, speech and thought processing grossly intact  ASSESSMENT AND PLAN:  Discussed the following assessment and plan:  COVID-19   Discussed treatment options, ideal treatment window, potential complications, isolation and precautions for COVID-19.  After lengthy discussion, the patient declined referral for Covid outpatient treatment/Rx at this time. The patient did want a prescription for cough, Tessalon Rx sent.  Other symptomatic care measures summarized in patient instructions. Work/School slipped offered: declined Scheduled follow up with PCP offered: she opted to follow up as needed Advised to seek prompt in person care if worsening, new symptoms arise, or if is not improving with treatment. Discussed options for inperson care if PCP office not available. Did let this patient know that I only do telemedicine on Tuesdays and Thursdays for [place]. Advised to schedule follow up visit with PCP or [facility] if any further questions or concerns to avoid delays in care.   I discussed the assessment  and treatment plan with the patient. The patient was provided an opportunity to ask questions and all were answered. The patient agreed with the plan and demonstrated an understanding of the instructions.     Lucretia Kern, DO

## 2020-10-05 NOTE — Patient Instructions (Signed)
  HOME CARE TIPS:  -I sent the medication(s) we discussed to your pharmacy: Meds ordered this encounter  Medications   benzonatate (TESSALON PERLES) 100 MG capsule    Sig: Take 2 capsules (200 mg total) by mouth 3 (three) times daily as needed.    Dispense:  20 capsule    Refill:  0     -I sent in the [place] treatment or referral you requested per our discussion. Please see the information provided below and discuss further with the pharmacist/treatment team.   -can use tylenol if needed for fevers, aches and pains per instructions  -can use nasal saline a few times per day if you have nasal congestion; sometimes  a short course of Afrin nasal spray for 3 days can help with symptoms as well  -stay hydrated, drink plenty of fluids and eat small healthy meals - avoid dairy  -can take 1000 IU (74mg) Vit D3 and 100-500 mg of Vit C daily per instructions  -If the Covid test is positive, check out the [facility] for more information on home care, transmission and treatment for COVID19  -follow up with your doctor in 2-3 days unless improving and feeling better  -stay home while sick, except to seek medical care. If you have COVID19, ideally it would be best to stay home for a full 10 days since the onset of symptoms PLUS one day of no fever and feeling better. Wear a good mask that fits snugly (such as N95 or KN95) if around others to reduce the risk of transmission.  It was nice to meet you today, and I really hope you are feeling better soon. I help [facility] out with telemedicine visits on Tuesdays and Thursdays and am available for visits on those days. If you have any concerns or questions following this visit please schedule a follow up visit with your Primary Care doctor or seek care at a local urgent care clinic to avoid delays in care.    Seek in person care or schedule a follow up video visit promptly if your symptoms worsen, new concerns arise or you are not improving with  treatment. Call 911 and/or seek emergency care if your symptoms are severe or life threatening.

## 2020-12-03 DIAGNOSIS — L308 Other specified dermatitis: Secondary | ICD-10-CM | POA: Diagnosis not present

## 2020-12-03 DIAGNOSIS — M2042 Other hammer toe(s) (acquired), left foot: Secondary | ICD-10-CM | POA: Diagnosis not present

## 2020-12-03 DIAGNOSIS — D2372 Other benign neoplasm of skin of left lower limb, including hip: Secondary | ICD-10-CM | POA: Diagnosis not present

## 2021-03-25 ENCOUNTER — Other Ambulatory Visit (HOSPITAL_BASED_OUTPATIENT_CLINIC_OR_DEPARTMENT_OTHER): Payer: Self-pay | Admitting: Obstetrics and Gynecology

## 2021-03-25 DIAGNOSIS — Z1231 Encounter for screening mammogram for malignant neoplasm of breast: Secondary | ICD-10-CM

## 2021-04-18 ENCOUNTER — Encounter (HOSPITAL_BASED_OUTPATIENT_CLINIC_OR_DEPARTMENT_OTHER): Payer: Self-pay

## 2021-04-18 ENCOUNTER — Ambulatory Visit (HOSPITAL_BASED_OUTPATIENT_CLINIC_OR_DEPARTMENT_OTHER)
Admission: RE | Admit: 2021-04-18 | Discharge: 2021-04-18 | Disposition: A | Discharge status: Home / Self Care | Payer: BC Managed Care – PPO | Source: Ambulatory Visit | Attending: Obstetrics and Gynecology | Admitting: Obstetrics and Gynecology

## 2021-04-18 ENCOUNTER — Other Ambulatory Visit: Payer: Self-pay

## 2021-04-18 DIAGNOSIS — Z1231 Encounter for screening mammogram for malignant neoplasm of breast: Secondary | ICD-10-CM | POA: Diagnosis not present

## 2021-04-22 DIAGNOSIS — M62838 Other muscle spasm: Secondary | ICD-10-CM | POA: Diagnosis not present

## 2021-04-25 ENCOUNTER — Ambulatory Visit: Payer: BC Managed Care – PPO | Admitting: Medical

## 2021-05-03 DIAGNOSIS — Z1151 Encounter for screening for human papillomavirus (HPV): Secondary | ICD-10-CM | POA: Diagnosis not present

## 2021-05-03 DIAGNOSIS — Z01419 Encounter for gynecological examination (general) (routine) without abnormal findings: Secondary | ICD-10-CM | POA: Diagnosis not present

## 2021-05-03 DIAGNOSIS — N920 Excessive and frequent menstruation with regular cycle: Secondary | ICD-10-CM | POA: Diagnosis not present

## 2021-05-03 DIAGNOSIS — Z01411 Encounter for gynecological examination (general) (routine) with abnormal findings: Secondary | ICD-10-CM | POA: Diagnosis not present

## 2021-05-04 ENCOUNTER — Ambulatory Visit: Payer: BC Managed Care – PPO | Admitting: Medical

## 2021-08-08 ENCOUNTER — Telehealth: Payer: BC Managed Care – PPO | Admitting: Physician Assistant

## 2021-08-08 DIAGNOSIS — J069 Acute upper respiratory infection, unspecified: Secondary | ICD-10-CM

## 2021-08-08 MED ORDER — FLUTICASONE PROPIONATE 50 MCG/ACT NA SUSP: 2.0000 | Freq: Every day | NASAL | 0 refills | Status: DC

## 2021-08-08 MED ORDER — BENZONATATE 100 MG PO CAPS: 100.0000 mg | ORAL_CAPSULE | Freq: Three times a day (TID) | ORAL | 0 refills | Status: AC

## 2021-08-08 NOTE — Progress Notes (Signed)

## 2021-09-18 IMAGING — CT CT ANGIO CHEST
2 of 8 series · 19 of 36 positions shown · IV contrast (Omnipaque)
Comparison: CT abdomen pelvis 12/03/2019

CLINICAL DATA: Increasing short of breath, chest pain. COVID 19
positive

EXAM:
CT ANGIOGRAPHY CHEST WITH CONTRAST
TECHNIQUE: Multidetector CT imaging of the chest was performed using the
standard protocol during bolus administration of intravenous
contrast. Multiplanar CT image reconstructions and MIPs were
obtained to evaluate the vascular anatomy.
CONTRAST:  78mL OMNIPAQUE IOHEXOL 350 MG/ML SOLN

[Series 6: pe thins · axial · 0.80mm/px · z∈[-263,-39]mm · 18 of 252 slices shown]
[im 14/252  lung]
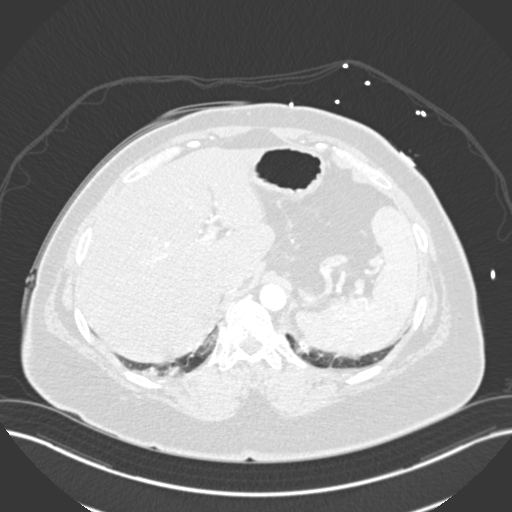
[im 27/252  mediastinal]
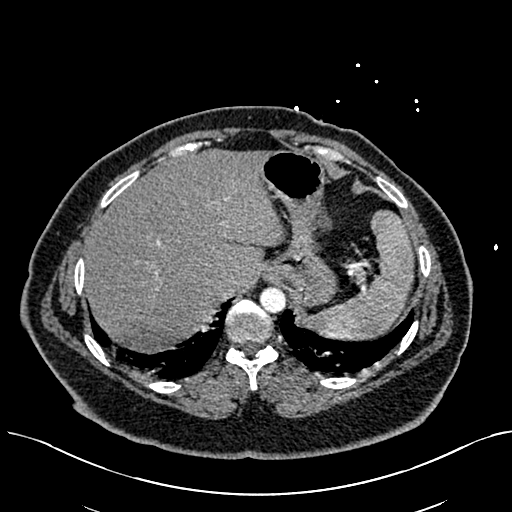
[im 40/252  lung]
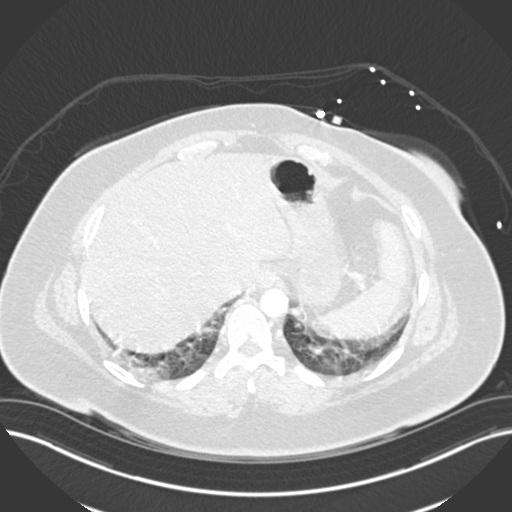
[im 53/252  mediastinal]
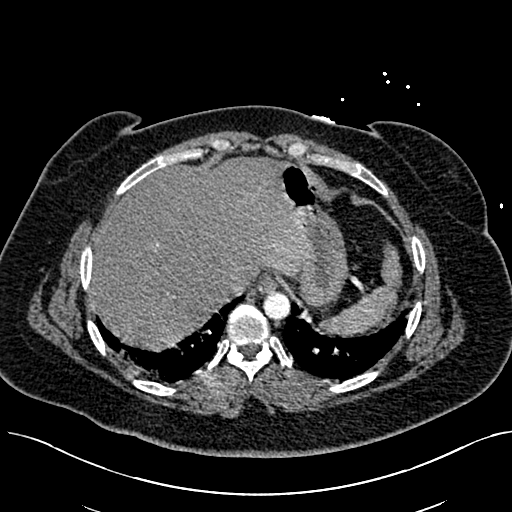
[im 67/252  lung]
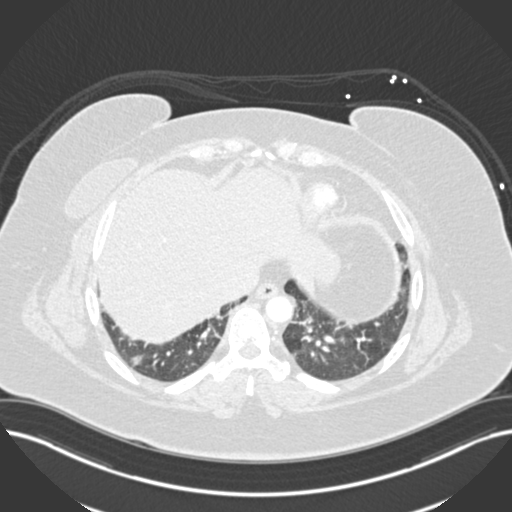
[im 80/252  mediastinal]
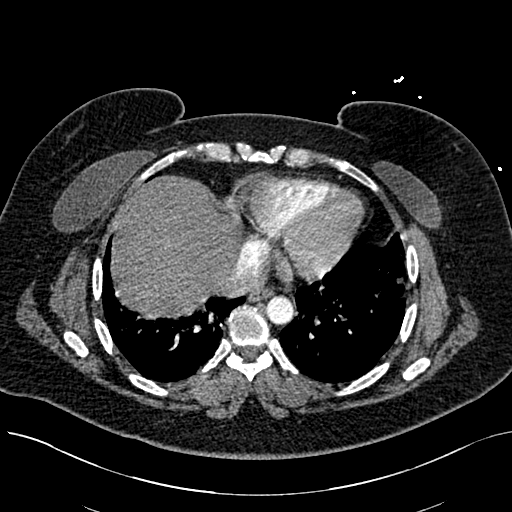
[im 93/252  lung]
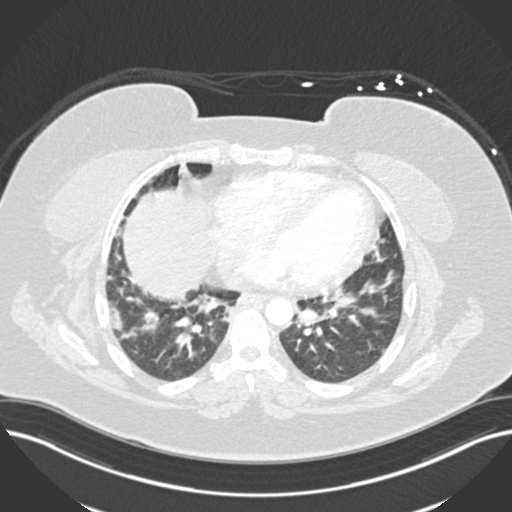
[im 106/252  mediastinal]
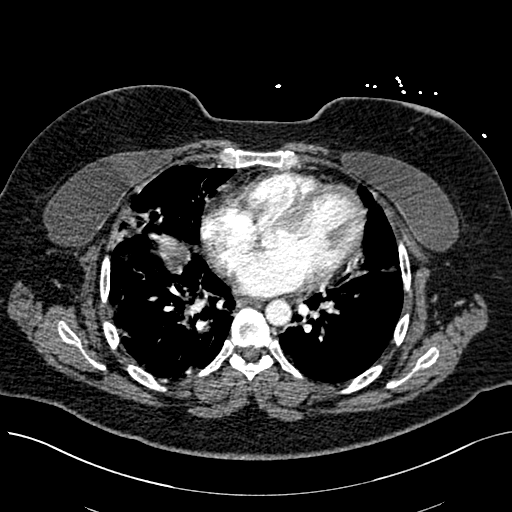
[im 119/252  lung]
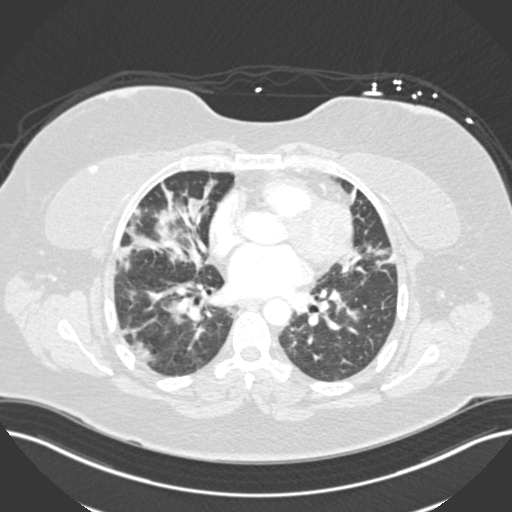
[im 133/252  mediastinal]
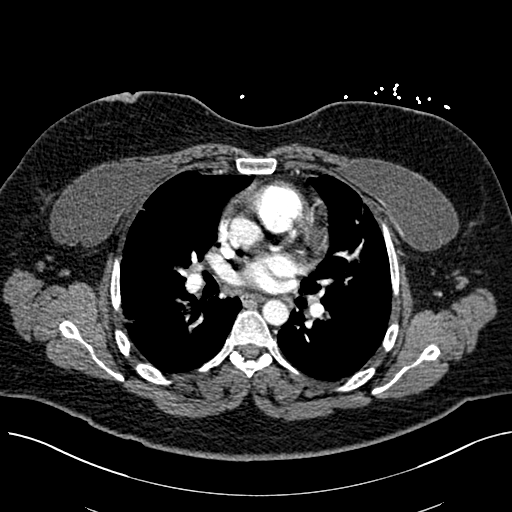
[im 146/252  lung]
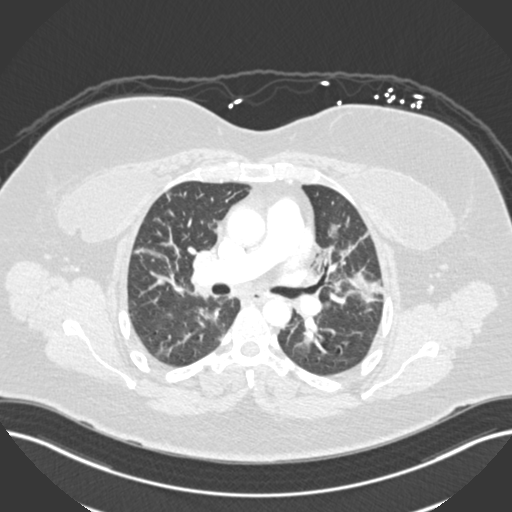
[im 159/252  mediastinal]
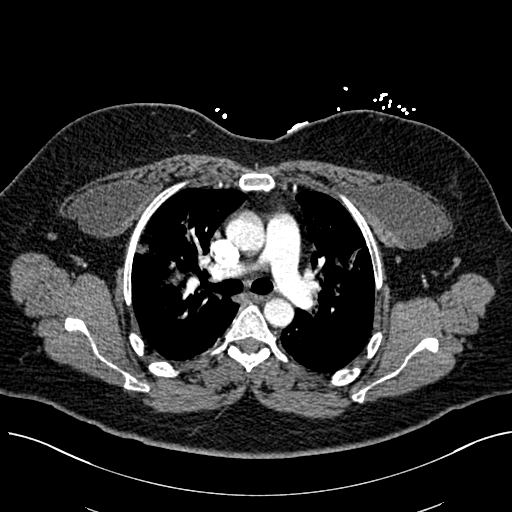
[im 172/252  lung]
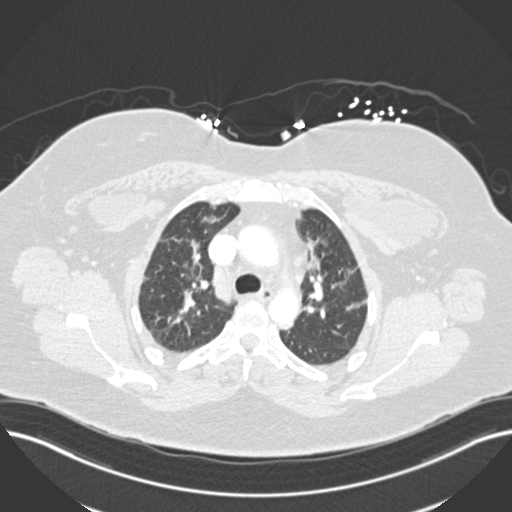
[im 185/252  mediastinal]
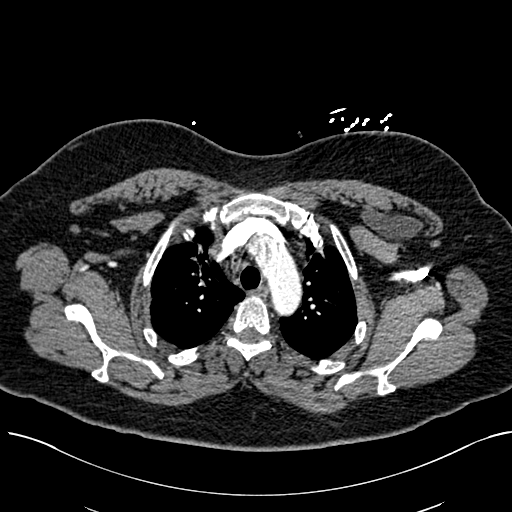
[im 199/252  lung]
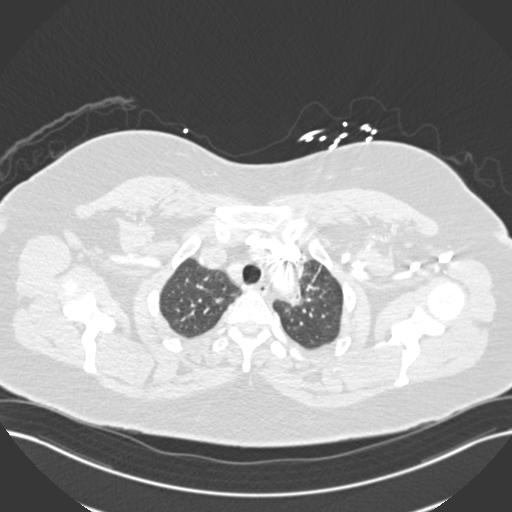
[im 212/252  mediastinal]
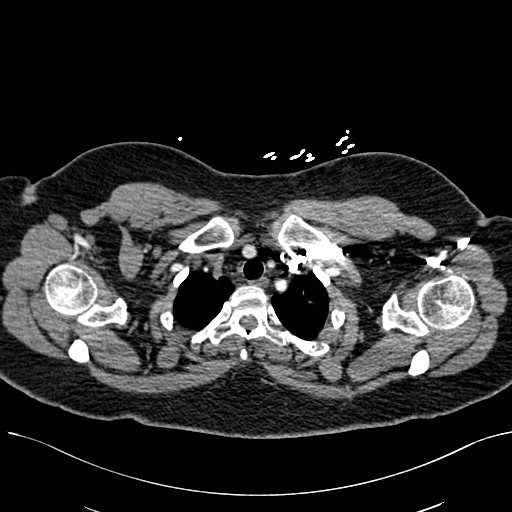
[im 225/252  lung]
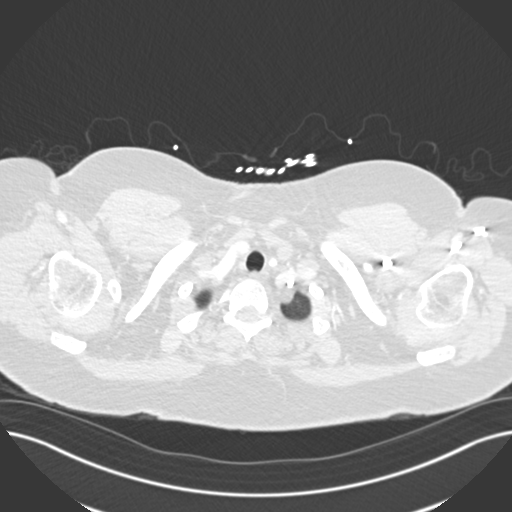
[im 238/252  mediastinal]
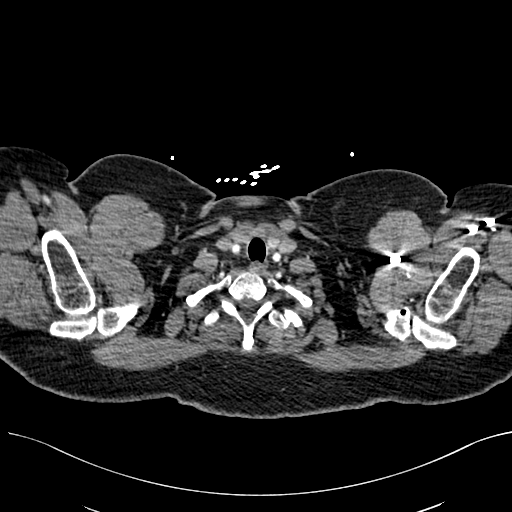

[Series 7: pe coronal mpr · coronal · 0.52mm/px · 1 of 150 slices shown]
[im 75/150  mediastinal]
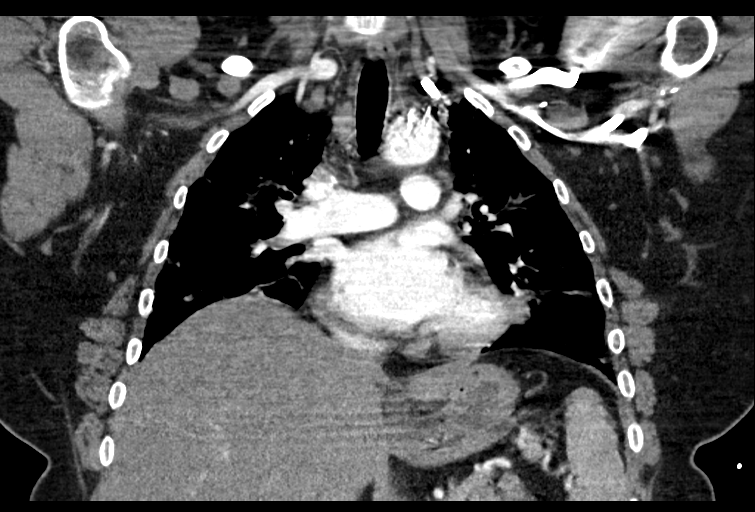

[19 of 36 positions shown; findings below may reference images not displayed]

FINDINGS: Cardiovascular: Good pulmonary artery enhancement. Negative for
pulmonary embolism. Pulmonary arteries normal in caliber. Thoracic
aorta normal. Heart size mildly enlarged. No pericardial effusion.

Mediastinum/Nodes: Negative for mass or adenopathy.

Lungs/Pleura: Diffuse bilateral patchy infiltrates bilaterally with
peripheral predominance. Progression since CT abdomen pelvis of
12/03/2019. No pleural effusion.

Upper Abdomen: Fatty infiltration liver.  No acute abnormality.

Musculoskeletal: Negative

Review of the MIP images confirms the above findings.
IMPRESSION: Negative for pulmonary embolism

Diffuse bilateral airspace disease compatible with COVID pneumonia.
Progression in the bases since the prior CT abdomen pelvis 2 days
ago.

## 2021-11-11 ENCOUNTER — Encounter: Payer: BC Managed Care – PPO | Admitting: Medical

## 2021-11-18 ENCOUNTER — Encounter: Payer: Self-pay | Admitting: Medical

## 2021-11-18 ENCOUNTER — Ambulatory Visit (INDEPENDENT_AMBULATORY_CARE_PROVIDER_SITE_OTHER): Payer: BC Managed Care – PPO | Admitting: Medical

## 2021-11-18 VITALS — BP 137/79 | HR 78 | Temp 98.2°F | Resp 18 | Ht 67.0 in | Wt 237.0 lb

## 2021-11-18 DIAGNOSIS — Z0000 Encounter for general adult medical examination without abnormal findings: Secondary | ICD-10-CM

## 2021-11-18 DIAGNOSIS — E041 Nontoxic single thyroid nodule: Secondary | ICD-10-CM

## 2021-11-18 DIAGNOSIS — E669 Obesity, unspecified: Secondary | ICD-10-CM | POA: Diagnosis not present

## 2021-11-18 DIAGNOSIS — R739 Hyperglycemia, unspecified: Secondary | ICD-10-CM

## 2021-11-18 DIAGNOSIS — R5383 Other fatigue: Secondary | ICD-10-CM | POA: Diagnosis not present

## 2021-11-18 DIAGNOSIS — J309 Allergic rhinitis, unspecified: Secondary | ICD-10-CM

## 2021-11-18 DIAGNOSIS — E010 Iodine-deficiency related diffuse (endemic) goiter: Secondary | ICD-10-CM

## 2021-11-18 LAB — CBC WITH DIFFERENTIAL/PLATELET
Basophils Absolute: 0.1 10*3/uL (ref 0.0–0.1)
Basophils Relative: 1.3 % (ref 0.0–3.0)
Eosinophils Absolute: 0.5 10*3/uL (ref 0.0–0.7)
Eosinophils Relative: 6.1 % — ABNORMAL HIGH (ref 0.0–5.0)
HCT: 44.3 % (ref 36.0–46.0)
Hemoglobin: 14.8 g/dL (ref 12.0–15.0)
Lymphocytes Relative: 32.3 % (ref 12.0–46.0)
Lymphs Abs: 2.7 10*3/uL (ref 0.7–4.0)
MCHC: 33.3 g/dL (ref 30.0–36.0)
MCV: 90.3 fl (ref 78.0–100.0)
Monocytes Absolute: 0.5 10*3/uL (ref 0.1–1.0)
Monocytes Relative: 6.3 % (ref 3.0–12.0)
Neutro Abs: 4.5 10*3/uL (ref 1.4–7.7)
Neutrophils Relative %: 54 % (ref 43.0–77.0)
Platelets: 274 10*3/uL (ref 150.0–400.0)
RBC: 4.91 Mil/uL (ref 3.87–5.11)
RDW: 12.7 % (ref 11.5–15.5)
WBC: 8.4 10*3/uL (ref 4.0–10.5)

## 2021-11-18 LAB — COMPREHENSIVE METABOLIC PANEL
ALT: 23 U/L (ref 0–35)
AST: 17 U/L (ref 0–37)
Albumin: 4.3 g/dL (ref 3.5–5.2)
Alkaline Phosphatase: 85 U/L (ref 39–117)
BUN: 13 mg/dL (ref 6–23)
CO2: 29 mEq/L (ref 19–32)
Calcium: 9.8 mg/dL (ref 8.4–10.5)
Chloride: 103 mEq/L (ref 96–112)
Creatinine, Ser: 0.85 mg/dL (ref 0.40–1.20)
GFR: 81.48 mL/min (ref >60.00)
Glucose, Bld: 93 mg/dL (ref 70–99)
Potassium: 4.8 mEq/L (ref 3.5–5.1)
Sodium: 139 mEq/L (ref 135–145)
Total Bilirubin: 0.5 mg/dL (ref 0.2–1.2)
Total Protein: 7.3 g/dL (ref 6.0–8.3)

## 2021-11-18 LAB — LIPID PANEL
Cholesterol: 188 mg/dL (ref 0–200)
HDL: 45.6 mg/dL (ref >39.00)
LDL Cholesterol: 116 mg/dL — ABNORMAL HIGH (ref 0–99)
NonHDL: 142.21
Total CHOL/HDL Ratio: 4
Triglycerides: 133 mg/dL (ref 0.0–149.0)
VLDL: 26.6 mg/dL (ref 0.0–40.0)

## 2021-11-18 LAB — VITAMIN B12: Vitamin B-12: 393 pg/mL (ref 211–911)

## 2021-11-18 LAB — TSH: TSH: 1.14 u[IU]/mL (ref 0.35–5.50)

## 2021-11-18 LAB — T4, FREE: Free T4: 0.79 ng/dL (ref 0.60–1.60)

## 2021-11-18 LAB — HEMOGLOBIN A1C: Hgb A1c MFr Bld: 6.3 % (ref 4.6–6.5)

## 2021-11-18 MED ORDER — MONTELUKAST SODIUM 10 MG PO TABS: 10.0000 mg | ORAL_TABLET | Freq: Every day | ORAL | 3 refills | Status: DC

## 2021-11-18 MED ORDER — ALBUTEROL SULFATE HFA 108 (90 BASE) MCG/ACT IN AERS: 2.0000 | INHALATION_SPRAY | Freq: Four times a day (QID) | RESPIRATORY_TRACT | 0 refills | Status: DC | PRN

## 2021-11-18 NOTE — Progress Notes (Signed)
Subjective:    Patient ID: Lisa Ellis, female    DOB: Feb 08, 1976, 46 y.o.   MRN: [id number]  HPI  Pt in for wellness exam. Pt is fasting.  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 Not exercising. Admits very poor diet. Nonsmoker and no alcohol.   Up to date on mammogram, pap and colonoscopy.   Declines flu vaccine/never gets per pt.  Pt bp initially high. She thinks work stress/issue before came in cause.  Obesity- bmi 61.2. pt states she had liposcution Feb 07 2022 last year. Pt has no family hx of thyromedulary cancer or pancreatitis.    In past [place] of neck showed.  IMPRESSION: 1. No thyroid nodules requiring further evaluation are identified. 2. Incidental note is made of a few small subcentimeter nodules and complex cysts in the left inferior gland which do not warrant further consideration.     Phq-9 score is 6.    Review of Systems  Constitutional:  Positive for fatigue. Negative for chills and fever.  HENT:  Negative for congestion, ear discharge, ear pain and facial swelling.   Respiratory:  Positive for wheezing. Negative for cough, chest tightness and shortness of breath.        Pt also mentions some mild wheezing at night. This has been going on for about a year. No history of asthma as child.   This occurs until she falls asleep.  Cardiovascular:  Negative for chest pain and palpitations.  Gastrointestinal:  Negative for abdominal pain, constipation and diarrhea.  Genitourinary:  Negative for dysuria, flank pain, frequency and urgency.  Musculoskeletal:  Negative for back pain.  Skin:  Negative for rash.  Neurological:  Negative for dizziness, syncope, speech difficulty, weakness and light-headedness.  Hematological:  Negative for adenopathy. Does not bruise/bleed easily.  Psychiatric/Behavioral:  Positive for dysphoric mood. Negative for behavioral problems, decreased concentration and sleep disturbance. The patient is not nervous/anxious.         Has depressed mood over past 2 months. Associated with work mostly but also her pet dogs have been sick.     Past Medical History:  Diagnosis Date   Allergy    Asthma    Only wheezed on time associated with pneumonia. none since. temporary only   COVID    September 2021   Depression    only had brief episode in 2006 for couple of months when dad passed away.     Social History   Socioeconomic History   Marital status: Married    Spouse name: Not on file   Number of children: Not on file   Years of education: Not on file   Highest education level: Not on file  Occupational History   Not on file  Tobacco Use   Smoking status: Never   Smokeless tobacco: Never  Vaping Use   Vaping Use: Never used  Substance and Sexual Activity   Alcohol use: No    Alcohol/week: 0.0 standard drinks of alcohol   Drug use: No   Sexual activity: Yes  Other Topics Concern   Not on file  Social History Narrative   Not on file   Social Determinants of Health   Financial Resource Strain: Not on file  Food Insecurity: Not on file  Transportation Needs: Not on file  Physical Activity: Not on file  Stress: Not on file  Social Connections: Not on file  Intimate Partner Violence: Not on file    Past Surgical History:  Procedure Laterality Date   AUGMENTATION  MAMMAPLASTY Bilateral 2013   Saline   CHOLECYSTECTOMY     NASAL SINUS SURGERY      Family History  Problem Relation Age of Onset   Hypertension Mother    Diabetes Father    Hypertension Maternal Grandfather    Diabetes Paternal Grandmother    Colon cancer Neg Hx    Esophageal cancer Neg Hx    Rectal cancer Neg Hx    Stomach cancer Neg Hx     No Known Allergies  Current Outpatient Medications on File Prior to Visit  Medication Sig Dispense Refill   aspirin EC 81 MG tablet Take 1 tablet (81 mg total) by mouth daily. 90 tablet 3   fluticasone (FLONASE) 50 MCG/ACT nasal spray Place 2 sprays into both nostrils daily. 16 g 0    [DISCONTINUED] pregabalin (LYRICA) 75 MG capsule Take 1 capsule (75 mg total) by mouth 2 (two) times daily. 60 capsule 0   Current Facility-Administered Medications on File Prior to Visit  Medication Dose Route Frequency Provider Last Rate Last Admin   0.9 %  sodium chloride infusion   Intravenous Once Lisa Eagles, PA-C        BP 137/79   Pulse 78   Temp 98.2 F (36.8 C)   Resp 18   Ht '5\' 7"'$  (1.702 m)   Wt 237 lb (107.5 kg)   SpO2 97%   BMI 37.12 kg/m         Objective:   Physical Exam  General Mental Status- Alert. General Appearance- Not in acute distress.   Skin General: Color- Normal Color. Moisture- Normal Moisture.  Neck Carotid Arteries- Normal color. Moisture- Normal Moisture. No carotid bruits. No JVD.  Chest and Lung Exam Auscultation: Breath Sounds:-Normal.  Cardiovascular Auscultation:Rythm- Regular. Murmurs & Other Heart Sounds:Auscultation of the heart reveals- No Murmurs.  Abdomen Inspection:-Inspeection Normal. Palpation/Percussion:Note:No mass. Palpation and Percussion of the abdomen reveal- Non Tender, Non Distended + BS, no rebound or guarding.   Neurologic Cranial Nerve exam:- CN III-XII intact(No nystagmus), symmetric smile. Strength:- 5/5 equal and symmetric strength both upper and lower extremities.      Assessment & Plan:   Patient Instructions  For you wellness exam today I have ordered cbc, cmp and  lipid panel.  Flu vaccine declined.  For fatigue will add tsh, t4 and b12 level.  Recommend exercise and healthy diet.  We will let you know lab results as they come in.  Follow up date appointment will be determined after lab review.    For obesity recommend ww app or my fitness pal app. If you want me to refer to weight loss management let me know.  For depressed mood let me know if you want to restart medication. If so consider effexor.  For elevated sugar will get a1c.         Lisa Pai, PA-C   [phone] charge  in addition. Did address, fatigue, obseity, mood, allergic rhinits and wheezing

## 2021-11-18 NOTE — Patient Instructions (Addendum)
For you wellness exam today I have ordered cbc, cmp and  lipid panel.  Flu vaccine declined.  Up to date on mammo, pap and colonoscopy.  For fatigue will add tsh, t4 and b12 level.  Recommend exercise and healthy diet.  We will let you know lab results as they come in.  Follow up date appointment will be determined after lab review.    For obesity recommend ww app or my fitness pal app. If you want me to refer to weight loss management let me know.  For depressed mood let me know if you want to restart medication. If so consider effexor.  Wheezing at nightly for 2 years. Also history of allergies. Can rx montelukast at night. Also as trial can use albuterol inhaler 2 hours before sleep and see if wheezing resolved.  For elevated sugar will get a1c.    Preventive Care 60-49 Years Old, Female Preventive care refers to lifestyle choices and visits with your health care provider that can promote health and wellness. Preventive care visits are also called wellness exams. What can I expect for my preventive care visit? Counseling Your health care provider may ask you questions about your: Medical history, including: Past medical problems. Family medical history. Pregnancy history. Current health, including: Menstrual cycle. Method of birth control. Emotional well-being. Home life and relationship well-being. Sexual activity and sexual health. Lifestyle, including: Alcohol, nicotine or tobacco, and drug use. Access to firearms. Diet, exercise, and sleep habits. Work and work Statistician. Sunscreen use. Safety issues such as seatbelt and bike helmet use. Physical exam Your health care provider will check your: Height and weight. These may be used to calculate your BMI (body mass index). BMI is a measurement that tells if you are at a healthy weight. Waist circumference. This measures the distance around your waistline. This measurement also tells if you are at a healthy  weight and may help predict your risk of certain diseases, such as type 2 diabetes and high blood pressure. Heart rate and blood pressure. Body temperature. Skin for abnormal spots. What immunizations do I need?  Vaccines are usually given at various ages, according to a schedule. Your health care provider will recommend vaccines for you based on your age, medical history, and lifestyle or other factors, such as travel or where you work. What tests do I need? Screening Your health care provider may recommend screening tests for certain conditions. This may include: Lipid and cholesterol levels. Diabetes screening. This is done by checking your blood sugar (glucose) after you have not eaten for a while (fasting). Pelvic exam and Pap test. Hepatitis B test. Hepatitis C test. HIV (human immunodeficiency virus) test. STI (sexually transmitted infection) testing, if you are at risk. Lung cancer screening. Colorectal cancer screening. Mammogram. Talk with your health care provider about when you should start having regular mammograms. This may depend on whether you have a family history of breast cancer. BRCA-related cancer screening. This may be done if you have a family history of breast, ovarian, tubal, or peritoneal cancers. Bone density scan. This is done to screen for osteoporosis. Talk with your health care provider about your test results, treatment options, and if necessary, the need for more tests. Follow these instructions at home: Eating and drinking  Eat a diet that includes fresh fruits and vegetables, whole grains, lean protein, and low-fat dairy products. Take vitamin and mineral supplements as recommended by your health care provider. Do not drink alcohol if: Your health care provider tells  you not to drink. You are pregnant, may be pregnant, or are planning to become pregnant. If you drink alcohol: Limit how much you have to 0-1 drink a day. Know how much alcohol is in  your drink. In the [place] one drink equals one 12 oz bottle of beer (355 mL), one 5 oz glass of wine (148 mL), or one 1 oz glass of hard liquor (44 mL). Lifestyle Brush your teeth every morning and night with fluoride toothpaste. Floss one time each day. Exercise for at least 30 minutes 5 or more days each week. Do not use any products that contain nicotine or tobacco. These products include cigarettes, chewing tobacco, and vaping devices, such as e-cigarettes. If you need help quitting, ask your health care provider. Do not use drugs. If you are sexually active, practice safe sex. Use a condom or other form of protection to prevent STIs. If you do not wish to become pregnant, use a form of birth control. If you plan to become pregnant, see your health care provider for a prepregnancy visit. Take aspirin only as told by your health care provider. Make sure that you understand how much to take and what form to take. Work with your health care provider to find out whether it is safe and beneficial for you to take aspirin daily. Find healthy ways to manage stress, such as: Meditation, yoga, or listening to music. Journaling. Talking to a trusted person. Spending time with friends and family. Minimize exposure to UV radiation to reduce your risk of skin cancer. Safety Always wear your seat belt while driving or riding in a vehicle. Do not drive: If you have been drinking alcohol. Do not ride with someone who has been drinking. When you are tired or distracted. While texting. If you have been using any mind-altering substances or drugs. Wear a helmet and other protective equipment during sports activities. If you have firearms in your house, make sure you follow all gun safety procedures. Seek help if you have been physically or sexually abused. What's next? Visit your health care provider once a year for an annual wellness visit. Ask your health care provider how often you should have your  eyes and teeth checked. Stay up to date on all vaccines. This information is not intended to replace advice given to you by your health care provider. Make sure you discuss any questions you have with your health care provider. Document Revised: 08/25/2020 Document Reviewed: 08/25/2020 Elsevier Patient Education  [place].

## 2021-11-19 ENCOUNTER — Encounter: Payer: Self-pay | Admitting: Medical

## 2021-11-21 NOTE — Addendum Note (Signed)
Addended by: Anabel Halon on: 11/21/2021 05:34 PM   Modules accepted: Orders

## 2021-11-23 ENCOUNTER — Ambulatory Visit (HOSPITAL_BASED_OUTPATIENT_CLINIC_OR_DEPARTMENT_OTHER)
Admission: RE | Admit: 2021-11-23 | Discharge: 2021-11-23 | Disposition: A | Discharge status: Home / Self Care | Payer: BC Managed Care – PPO | Source: Ambulatory Visit | Attending: Medical | Admitting: Medical

## 2021-11-23 DIAGNOSIS — E041 Nontoxic single thyroid nodule: Secondary | ICD-10-CM | POA: Diagnosis not present

## 2021-11-23 DIAGNOSIS — E010 Iodine-deficiency related diffuse (endemic) goiter: Secondary | ICD-10-CM

## 2021-11-24 MED ORDER — OZEMPIC (0.25 OR 0.5 MG/DOSE) 2 MG/3ML ~~LOC~~ SOPN: PEN_INJECTOR | SUBCUTANEOUS | 0 refills | Status: DC

## 2021-11-24 NOTE — Addendum Note (Signed)
Addended by: Anabel Halon on: 11/24/2021 06:45 PM   Modules accepted: Orders

## 2021-11-25 ENCOUNTER — Telehealth: Payer: Self-pay

## 2021-11-25 ENCOUNTER — Other Ambulatory Visit (HOSPITAL_BASED_OUTPATIENT_CLINIC_OR_DEPARTMENT_OTHER): Payer: BC Managed Care – PPO

## 2021-11-25 NOTE — Telephone Encounter (Signed)
Received PA for Lisa Ellis- Pt does not have diabetes. Closing request.

## 2021-11-26 ENCOUNTER — Encounter: Payer: Self-pay | Admitting: Medical

## 2021-11-28 MED ORDER — SEMAGLUTIDE-WEIGHT MANAGEMENT 0.25 MG/0.5ML ~~LOC~~ SOAJ: 0.2500 mg | SUBCUTANEOUS | 0 refills | Status: DC

## 2021-11-28 NOTE — Addendum Note (Signed)
Addended by: Anabel Halon on: 11/28/2021 08:52 PM   Modules accepted: Orders

## 2021-11-28 NOTE — Telephone Encounter (Signed)
Alternative requested

## 2021-11-29 NOTE — Telephone Encounter (Signed)
Pt.notified

## 2021-12-05 NOTE — Telephone Encounter (Signed)
Can you send in ozepmic for patient , she is willing to pay out of pocket

## 2021-12-06 MED ORDER — OZEMPIC (0.25 OR 0.5 MG/DOSE) 2 MG/3ML ~~LOC~~ SOPN: PEN_INJECTOR | SUBCUTANEOUS | 0 refills | Status: DC

## 2021-12-06 NOTE — Addendum Note (Signed)
Addended by: Anabel Halon on: 12/06/2021 08:28 AM   Modules accepted: Orders

## 2021-12-17 ENCOUNTER — Other Ambulatory Visit: Payer: Self-pay | Admitting: Medical

## 2021-12-23 ENCOUNTER — Telehealth: Payer: Self-pay | Admitting: *Deleted

## 2021-12-23 NOTE — Telephone Encounter (Signed)
Opened in error

## 2022-01-03 ENCOUNTER — Telehealth (INDEPENDENT_AMBULATORY_CARE_PROVIDER_SITE_OTHER): Payer: BC Managed Care – PPO | Admitting: Medical

## 2022-01-03 ENCOUNTER — Encounter: Payer: Self-pay | Admitting: Medical

## 2022-01-03 VITALS — BP 130/78

## 2022-01-03 DIAGNOSIS — M5440 Lumbago with sciatica, unspecified side: Secondary | ICD-10-CM | POA: Diagnosis not present

## 2022-01-03 MED ORDER — TRAMADOL HCL 50 MG PO TABS: 50.0000 mg | ORAL_TABLET | Freq: Two times a day (BID) | ORAL | 0 refills | Status: AC | PRN

## 2022-01-03 MED ORDER — CYCLOBENZAPRINE HCL 10 MG PO TABS: ORAL_TABLET | ORAL | 0 refills | Status: DC

## 2022-01-03 MED ORDER — MELOXICAM 7.5 MG PO TABS: ORAL_TABLET | ORAL | 0 refills | Status: DC

## 2022-01-03 NOTE — Progress Notes (Signed)
   Subjective:    Patient ID: Lisa Ellis, female    DOB: 1976-01-05, 46 y.o.   MRN: [id number]  HPI Virtual Visit via Video Note  I connected with Lisa Ellis on 01/03/22 at  1:40 PM EDT by a video enabled telemedicine application and verified that I am speaking with the correct person using two identifiers.  Location: Patient: home Provider: office   I discussed the limitations of evaluation and management by telemedicine and the availability of in person appointments. The patient expressed understanding and agreed to proceed.  History of Present Illness:   Pt has hx of rt lower back/sciatica area pain for which she has been seeing chiropracter since just the other day. Pain started one month ago. Last 2 weeks got worse. Pt shows me by video pain from rt si are down to buttock and down back of leg. No numbness of leg. State will feel ache down her leg. Pt tried tylenol and ibuprofen both. Neither is helping much.   No weakness to leg. No incontinence.  Pt states other day walking was in tears. But not presently.  No numbness in great toes or side of her feet.   Observations/Objective: General-no acute distress, pleasant, oriented. Lungs- on inspection lungs appear unlabored. Neck- no tracheal deviation or jvd on inspection. Neuro- gross motor function appears intact.   Assessment and Plan:  Patient Instructions  Low back pain with rt side sciatica features for one month.  Get xray of lumbar spine today or tomorrow.  Meloxicam 7.5 mg 1-2 tab daily. Flexeril 10 mg q hs for next 3 nights. Will make low dose tramadol available very for severe pain.   Back stretches exercise as tolerated.  If symptoms worsen or persist consider sport med and physical therapy   Follow up 2 weeks or sooner if needed.   [name], PA-C  Follow Up Instructions:    I discussed the assessment and treatment plan with the patient. The patient was provided an opportunity to ask questions  and all were answered. The patient agreed with the plan and demonstrated an understanding of the instructions.   The patient was advised to call back or seek an in-person evaluation if the symptoms worsen or if the condition fails to improve as anticipated.     [name], PA-C    Review of Systems     Objective:   Physical Exam        Assessment & Plan:

## 2022-01-03 NOTE — Patient Instructions (Addendum)
Low back pain with rt side sciatica features for one month.  Get xray of lumbar spine today or tomorrow.  Meloxicam 7.5 mg 1-2 tab daily. Flexeril 10 mg q hs for next 3 nights. Will make low dose tramadol available very for severe pain.   Back stretches exercise as tolerated.  If symptoms worsen or persist consider sport med and physical therapy   Follow up 2 weeks or sooner if needed.  Back Exercises The following exercises strengthen the muscles that help to support the trunk (torso) and back. They also help to keep the lower back flexible. Doing these exercises can help to prevent or lessen existing low back pain. If you have back pain or discomfort, try doing these exercises 2-3 times each day or as told by your health care provider. As your pain improves, do them once each day, but increase the number of times that you repeat the steps for each exercise (do more repetitions). To prevent the recurrence of back pain, continue to do these exercises once each day or as told by your health care provider. Do exercises exactly as told by your health care provider and adjust them as directed. It is normal to feel mild stretching, pulling, tightness, or discomfort as you do these exercises, but you should stop right away if you feel sudden pain or your pain gets worse. Exercises Single knee to chest Repeat these steps 3-5 times for each leg: Lie on your back on a firm bed or the floor with your legs extended. Bring one knee to your chest. Your other leg should stay extended and in contact with the floor. Hold your knee in place by grabbing your knee or thigh with both hands and hold. Pull on your knee until you feel a gentle stretch in your lower back or buttocks. Hold the stretch for 10-30 seconds. Slowly release and straighten your leg.  Pelvic tilt Repeat these steps 5-10 times: Lie on your back on a firm bed or the floor with your legs extended. Bend your knees so they are pointing  toward the ceiling and your feet are flat on the floor. Tighten your lower abdominal muscles to press your lower back against the floor. This motion will tilt your pelvis so your tailbone points up toward the ceiling instead of pointing to your feet or the floor. With gentle tension and even breathing, hold this position for 5-10 seconds.  Cat-cow Repeat these steps until your lower back becomes more flexible: Get into a hands-and-knees position on a firm bed or the floor. Keep your hands under your shoulders, and keep your knees under your hips. You may place padding under your knees for comfort. Let your head hang down toward your chest. Contract your abdominal muscles and point your tailbone toward the floor so your lower back becomes rounded like the back of a cat. Hold this position for 5 seconds. Slowly lift your head, let your abdominal muscles relax, and point your tailbone up toward the ceiling so your back forms a sagging arch like the back of a cow. Hold this position for 5 seconds.  Press-ups Repeat these steps 5-10 times: Lie on your abdomen (face-down) on a firm bed or the floor. Place your palms near your head, about shoulder-width apart. Keeping your back as relaxed as possible and keeping your hips on the floor, slowly straighten your arms to raise the top half of your body and lift your shoulders. Do not use your back muscles to raise your  upper torso. You may adjust the placement of your hands to make yourself more comfortable. Hold this position for 5 seconds while you keep your back relaxed. Slowly return to lying flat on the floor.  Bridges Repeat these steps 10 times: Lie on your back on a firm bed or the floor. Bend your knees so they are pointing toward the ceiling and your feet are flat on the floor. Your arms should be flat at your sides, next to your body. Tighten your buttocks muscles and lift your buttocks off the floor until your waist is at almost the same  height as your knees. You should feel the muscles working in your buttocks and the back of your thighs. If you do not feel these muscles, slide your feet 1-2 inches (2.5-5 cm) farther away from your buttocks. Hold this position for 3-5 seconds. Slowly lower your hips to the starting position, and allow your buttocks muscles to relax completely. If this exercise is too easy, try doing it with your arms crossed over your chest. Abdominal crunches Repeat these steps 5-10 times: Lie on your back on a firm bed or the floor with your legs extended. Bend your knees so they are pointing toward the ceiling and your feet are flat on the floor. Cross your arms over your chest. Tip your chin slightly toward your chest without bending your neck. Tighten your abdominal muscles and slowly raise your torso high enough to lift your shoulder blades a tiny bit off the floor. Avoid raising your torso higher than that because it can put too much stress on your lower back and does not help to strengthen your abdominal muscles. Slowly return to your starting position.  Back lifts Repeat these steps 5-10 times: Lie on your abdomen (face-down) with your arms at your sides, and rest your forehead on the floor. Tighten the muscles in your legs and your buttocks. Slowly lift your chest off the floor while you keep your hips pressed to the floor. Keep the back of your head in line with the curve in your back. Your eyes should be looking at the floor. Hold this position for 3-5 seconds. Slowly return to your starting position.  Contact a health care provider if: Your back pain or discomfort gets much worse when you do an exercise. Your worsening back pain or discomfort does not lessen within 2 hours after you exercise. If you have any of these problems, stop doing these exercises right away. Do not do them again unless your health care provider says that you can. Get help right away if: You develop sudden, severe back  pain. If this happens, stop doing the exercises right away. Do not do them again unless your health care provider says that you can. This information is not intended to replace advice given to you by your health care provider. Make sure you discuss any questions you have with your health care provider. Document Revised: 08/24/2020 Document Reviewed: 05/12/2020 Elsevier Patient Education  [place].

## 2022-01-11 ENCOUNTER — Other Ambulatory Visit (HOSPITAL_COMMUNITY): Payer: Self-pay

## 2022-01-11 ENCOUNTER — Telehealth: Payer: Self-pay | Admitting: Medical

## 2022-01-11 ENCOUNTER — Other Ambulatory Visit (HOSPITAL_BASED_OUTPATIENT_CLINIC_OR_DEPARTMENT_OTHER): Payer: Self-pay

## 2022-01-11 MED ORDER — SEMAGLUTIDE-WEIGHT MANAGEMENT 0.25 MG/0.5ML ~~LOC~~ SOAJ
0.2500 mg | SUBCUTANEOUS | 0 refills | Status: DC
  Filled 2022-01-11: qty 2, 28d supply, fill #0

## 2022-01-11 NOTE — Telephone Encounter (Signed)
Rx sent 

## 2022-01-11 NOTE — Telephone Encounter (Signed)
Pt called stating that she had called around on her Wegovy and the pharmacy downstairs stated that they can put her on the list to get that medication to her when it becomes available. Pt would like to have the [facility] routed to the following pharmacy:  [place] [facility] [place], [place],  [place]  [phone]

## 2022-01-12 ENCOUNTER — Other Ambulatory Visit: Payer: Self-pay

## 2022-01-12 ENCOUNTER — Telehealth: Payer: Self-pay

## 2022-01-12 NOTE — Telephone Encounter (Signed)
PA denied. Weight loss not covered by plan.

## 2022-01-12 NOTE — Telephone Encounter (Signed)
PA initiated via Covermymeds; KEY: [id number]. Awaiting determination.

## 2022-01-16 ENCOUNTER — Other Ambulatory Visit (HOSPITAL_BASED_OUTPATIENT_CLINIC_OR_DEPARTMENT_OTHER): Payer: Self-pay

## 2022-01-17 ENCOUNTER — Other Ambulatory Visit (HOSPITAL_BASED_OUTPATIENT_CLINIC_OR_DEPARTMENT_OTHER): Payer: Self-pay

## 2022-01-18 ENCOUNTER — Other Ambulatory Visit (HOSPITAL_BASED_OUTPATIENT_CLINIC_OR_DEPARTMENT_OTHER): Payer: Self-pay

## 2022-01-20 ENCOUNTER — Other Ambulatory Visit (HOSPITAL_BASED_OUTPATIENT_CLINIC_OR_DEPARTMENT_OTHER): Payer: Self-pay

## 2022-01-23 ENCOUNTER — Other Ambulatory Visit (HOSPITAL_BASED_OUTPATIENT_CLINIC_OR_DEPARTMENT_OTHER): Payer: Self-pay

## 2022-01-26 ENCOUNTER — Other Ambulatory Visit (HOSPITAL_BASED_OUTPATIENT_CLINIC_OR_DEPARTMENT_OTHER): Payer: Self-pay

## 2022-02-15 ENCOUNTER — Other Ambulatory Visit: Payer: Self-pay | Admitting: Medical

## 2022-02-24 ENCOUNTER — Ambulatory Visit: Payer: BC Managed Care – PPO | Admitting: Medical

## 2022-03-20 ENCOUNTER — Other Ambulatory Visit (HOSPITAL_BASED_OUTPATIENT_CLINIC_OR_DEPARTMENT_OTHER): Payer: Self-pay | Admitting: Obstetrics and Gynecology

## 2022-03-20 DIAGNOSIS — Z1231 Encounter for screening mammogram for malignant neoplasm of breast: Secondary | ICD-10-CM

## 2022-04-20 ENCOUNTER — Ambulatory Visit: Payer: BC Managed Care – PPO | Admitting: Medical

## 2022-04-20 VITALS — BP 136/80 | HR 75 | Temp 98.0°F | Resp 18 | Ht 67.0 in | Wt 236.4 lb

## 2022-04-20 DIAGNOSIS — E669 Obesity, unspecified: Secondary | ICD-10-CM

## 2022-04-20 DIAGNOSIS — M5440 Lumbago with sciatica, unspecified side: Secondary | ICD-10-CM

## 2022-04-20 MED ORDER — CYCLOBENZAPRINE HCL 5 MG PO TABS: ORAL_TABLET | ORAL | 0 refills | Status: DC

## 2022-04-20 MED ORDER — PREDNISONE 10 MG (21) PO TBPK: ORAL_TABLET | ORAL | 0 refills | Status: DC

## 2022-04-20 NOTE — Progress Notes (Signed)
Subjective:    Patient ID: Lisa Ellis, female    DOB: 11-03-1975, 47 y.o.   MRN: [id number]  HPI Pt in with persisting rt side lower back pain. She states pain is shooting down her leg all the way down to rt ankle at time. When she sits has minimal pain but with activity pain worsens daily.  Pt pain has been more than 3 months. Had virtual visit in October.  I rx'd meloxicam, flexeril and tramadol. Symptoms did not improve. Had mentioned possible sport med and PT.  Xray ordered on virtual visit but was not done.  Pt since virtual visit went to chiropracter 4-5 times but has not helped pain.  Presently not an any med for pain.  Most of time pain rt si area. Sometimes pain across lumbar spine. But no radiating pain ever to left lower ext/si area.   Review of Systems  Constitutional:  Negative for chills, fatigue and fever.  Respiratory:  Negative for cough, chest tightness, shortness of breath and wheezing.   Cardiovascular:  Negative for chest pain and palpitations.  Gastrointestinal:  Negative for abdominal pain and blood in stool.  Musculoskeletal:  Negative for back pain.  Skin:  Negative for rash.  Hematological:  Negative for adenopathy. Does not bruise/bleed easily.  Psychiatric/Behavioral:  Negative for behavioral problems and decreased concentration.    Past Medical History:  Diagnosis Date   Allergy    Asthma    Only wheezed on time associated with pneumonia. none since. temporary only   COVID    September 2021   Depression    only had brief episode in 2006 for couple of months when dad passed away.     Social History   Socioeconomic History   Marital status: Married    Spouse name: Not on file   Number of children: Not on file   Years of education: Not on file   Highest education level: Not on file  Occupational History   Not on file  Tobacco Use   Smoking status: Never   Smokeless tobacco: Never  Vaping Use   Vaping Use: Never used  Substance and  Sexual Activity   Alcohol use: No    Alcohol/week: 0.0 standard drinks of alcohol   Drug use: No   Sexual activity: Yes  Other Topics Concern   Not on file  Social History Narrative   Not on file   Social Determinants of Health   Financial Resource Strain: Not on file  Food Insecurity: Not on file  Transportation Needs: Not on file  Physical Activity: Not on file  Stress: Not on file  Social Connections: Not on file  Intimate Partner Violence: Not on file    Past Surgical History:  Procedure Laterality Date   AUGMENTATION MAMMAPLASTY Bilateral 2013   Saline   CHOLECYSTECTOMY     NASAL SINUS SURGERY      Family History  Problem Relation Age of Onset   Hypertension Mother    Diabetes Father    Hypertension Maternal Grandfather    Diabetes Paternal Grandmother    Colon cancer Neg Hx    Esophageal cancer Neg Hx    Rectal cancer Neg Hx    Stomach cancer Neg Hx     No Known Allergies  Current Outpatient Medications on File Prior to Visit  Medication Sig Dispense Refill   albuterol (VENTOLIN HFA) 108 (90 Base) MCG/ACT inhaler Inhale 2 puffs into the lungs every 6 (six) hours as needed. 18 g 0  aspirin EC 81 MG tablet Take 1 tablet (81 mg total) by mouth daily. 90 tablet 3   cyclobenzaprine (FLEXERIL) 10 MG tablet 1 tab po q hs for 3 nights prn back pain. 3 tablet 0   fluticasone (FLONASE) 50 MCG/ACT nasal spray Place 2 sprays into both nostrils daily. 16 g 0   meloxicam (MOBIC) 7.5 MG tablet 1-2 tab po daily. 30 tablet 0   montelukast (SINGULAIR) 10 MG tablet Take 1 tablet (10 mg total) by mouth at bedtime. 90 tablet 1   Semaglutide-Weight Management 0.25 MG/0.5ML SOAJ Inject 0.25 mg into the skin once a week. Rx wegovy for obesity/weight loss 2 mL 0   [DISCONTINUED] pregabalin (LYRICA) 75 MG capsule Take 1 capsule (75 mg total) by mouth 2 (two) times daily. 60 capsule 0   Current Facility-Administered Medications on File Prior to Visit  Medication Dose Route  Frequency Provider Last Rate Last Admin   0.9 %  sodium chloride infusion   Intravenous Once Jaynee Eagles, PA-C        BP 136/80   Pulse 75   Temp 98 F (36.7 C)   Resp 18   Ht '5\' 7"'$  (1.702 m)   Wt 236 lb 6.4 oz (107.2 kg)   SpO2 98%   BMI 37.03 kg/m         Objective:   Physical Exam  General Appearance- Not in acute distress.    Chest and Lung Exam Auscultation: Breath sounds:-Normal. Clear even and unlabored. Adventitious sounds:- No Adventitious sounds.  Cardiovascular Auscultation:Rythm - Regular, rate and rythm. Heart Sounds -Normal heart sounds.  Abdomen Inspection:-Inspection Normal.  Palpation/Perucssion: Palpation and Percussion of the abdomen reveal- Non Tender, No Rebound tenderness, No rigidity(Guarding) and No Palpable abdominal masses.  Liver:-Normal.  Spleen:- Normal.   Back Slight rt side lumbar region  tenderness to palpation. Rt si tenderness on palpation Pain on straight leg lift. Pain on lateral movements and flexion/extension of the spine.  Lower ext neurologic  L5-S1 sensation intact bilaterally. Normal patellar reflexes bilaterally. No foot drop bilaterally.       Assessment & Plan:   Patient Instructions  Lumbar back region pain with sciatica feature. Radicular pain at times all the way down to ankle.  Rx 6 day taper dose prednisone. Rx flexeril 5 mg at night. Low number.  Xray of lumbar spine today.   After xray review may refer to sport med MD.   Discussed wt loss and your rx ozempic. Benefits vs risk.  Follow up date to be determined after xray review.   [name], PA-C

## 2022-04-20 NOTE — Patient Instructions (Addendum)
Lumbar back region pain with sciatica feature. Radicular pain at times all the way down to ankle.  Rx 6 day taper dose prednisone. Rx flexeril 5 mg at night. Low number.  Xray of lumbar spine today.   After xray review may refer to sport med MD.   Discussed wt loss and your rx ozempic. Benefits vs risk.  Follow up date to be determined after xray review.

## 2022-04-21 ENCOUNTER — Ambulatory Visit: Payer: BC Managed Care – PPO | Admitting: Medical

## 2022-04-24 ENCOUNTER — Other Ambulatory Visit (HOSPITAL_BASED_OUTPATIENT_CLINIC_OR_DEPARTMENT_OTHER): Payer: Self-pay | Admitting: Obstetrics and Gynecology

## 2022-04-24 ENCOUNTER — Ambulatory Visit (HOSPITAL_BASED_OUTPATIENT_CLINIC_OR_DEPARTMENT_OTHER)
Admission: RE | Admit: 2022-04-24 | Discharge: 2022-04-24 | Disposition: A | Discharge status: Home / Self Care | Payer: BC Managed Care – PPO | Source: Ambulatory Visit | Attending: Medical | Admitting: Medical

## 2022-04-24 ENCOUNTER — Ambulatory Visit (HOSPITAL_BASED_OUTPATIENT_CLINIC_OR_DEPARTMENT_OTHER)
Admission: RE | Admit: 2022-04-24 | Discharge: 2022-04-24 | Disposition: A | Discharge status: Home / Self Care | Payer: BC Managed Care – PPO | Source: Ambulatory Visit | Attending: Obstetrics and Gynecology | Admitting: Obstetrics and Gynecology

## 2022-04-24 ENCOUNTER — Encounter (HOSPITAL_BASED_OUTPATIENT_CLINIC_OR_DEPARTMENT_OTHER): Payer: Self-pay

## 2022-04-24 DIAGNOSIS — M5440 Lumbago with sciatica, unspecified side: Secondary | ICD-10-CM | POA: Diagnosis not present

## 2022-04-24 DIAGNOSIS — M5136 Other intervertebral disc degeneration, lumbar region: Secondary | ICD-10-CM | POA: Diagnosis not present

## 2022-04-24 DIAGNOSIS — Z1231 Encounter for screening mammogram for malignant neoplasm of breast: Secondary | ICD-10-CM | POA: Diagnosis not present

## 2022-05-10 ENCOUNTER — Encounter: Payer: Self-pay | Admitting: Medical

## 2022-09-05 DIAGNOSIS — L02612 Cutaneous abscess of left foot: Secondary | ICD-10-CM | POA: Diagnosis not present

## 2022-09-05 DIAGNOSIS — M2042 Other hammer toe(s) (acquired), left foot: Secondary | ICD-10-CM | POA: Diagnosis not present

## 2022-09-05 DIAGNOSIS — L03032 Cellulitis of left toe: Secondary | ICD-10-CM | POA: Diagnosis not present

## 2022-10-07 ENCOUNTER — Encounter: Payer: Self-pay | Admitting: Medical

## 2022-10-07 ENCOUNTER — Telehealth: Payer: BC Managed Care – PPO | Admitting: Physician Assistant

## 2022-10-07 DIAGNOSIS — J019 Acute sinusitis, unspecified: Secondary | ICD-10-CM | POA: Diagnosis not present

## 2022-10-07 MED ORDER — AMOXICILLIN-POT CLAVULANATE 875-125 MG PO TABS: 1.0000 | ORAL_TABLET | Freq: Two times a day (BID) | ORAL | 0 refills | Status: DC

## 2022-10-07 NOTE — Progress Notes (Signed)
E-Visit for Sinus Problems  We are sorry that you are not feeling well.  Here is how we plan to help!  Based on what you have shared with me it looks like you have sinusitis.  Sinusitis is inflammation and infection in the sinus cavities of the head.  Based on your presentation I believe you most likely have Acute Bacterial Sinusitis.  This is an infection caused by bacteria and is treated with antibiotics. I have prescribed Augmentin 875mg /125mg  one tablet twice daily with food, for 7 days. You may use an oral decongestant such as Mucinex D or if you have glaucoma or high blood pressure use plain Mucinex. Saline nasal spray help and can safely be used as often as needed for congestion.  If you develop worsening sinus pain, fever or notice severe headache and vision changes, or if symptoms are not better after completion of antibiotic, please schedule an appointment with a health care provider.    Sinus infections are not as easily transmitted as other respiratory infection, however we still recommend that you avoid close contact with loved ones, especially the very young and elderly.  Remember to wash your hands thoroughly throughout the day as this is the number one way to prevent the spread of infection!  I cannot prescribed anti-anxiety medications through [facility].  Please reach our to your primary care physician for management of anxiety.   Home Care: Only take medications as instructed by your medical team. Complete the entire course of an antibiotic. Do not take these medications with alcohol. A steam or ultrasonic humidifier can help congestion.  You can place a towel over your head and breathe in the steam from hot water coming from a faucet. Avoid close contacts especially the very young and the elderly. Cover your mouth when you cough or sneeze. Always remember to wash your hands.  Get Help Right Away If: You develop worsening fever or sinus pain. You develop a severe head ache or  visual changes. Your symptoms persist after you have completed your treatment plan.  Make sure you Understand these instructions. Will watch your condition. Will get help right away if you are not doing well or get worse.  Thank you for choosing an e-visit.  Your e-visit answers were reviewed by a board certified advanced clinical practitioner to complete your personal care plan. Depending upon the condition, your plan could have included both over the counter or prescription medications.  Please review your pharmacy choice. Make sure the pharmacy is open so you can pick up prescription now. If there is a problem, you may contact your provider through [employer] and have the prescription routed to another pharmacy.  Your safety is important to [place]. If you have drug allergies check your prescription carefully.   For the next 24 hours you can use MyChart to ask questions about today's visit, request a non-urgent call back, or ask for a work or school excuse. You will get an email in the next two days asking about your experience. I hope that your e-visit has been valuable and will speed your recovery.   I have spent 5 minutes in review of e-visit questionnaire, review and updating patient chart, medical decision making and response to patient.   Tylene Fantasia Ward, PA-C

## 2022-10-08 ENCOUNTER — Other Ambulatory Visit: Payer: Self-pay | Admitting: Family

## 2022-10-08 MED ORDER — HYDROXYZINE HCL 25 MG PO TABS: 12.5000 mg | ORAL_TABLET | Freq: Three times a day (TID) | ORAL | 0 refills | Status: DC | PRN

## 2022-11-08 DIAGNOSIS — L820 Inflamed seborrheic keratosis: Secondary | ICD-10-CM | POA: Diagnosis not present

## 2022-11-08 DIAGNOSIS — L719 Rosacea, unspecified: Secondary | ICD-10-CM | POA: Diagnosis not present

## 2022-11-27 DIAGNOSIS — M76822 Posterior tibial tendinitis, left leg: Secondary | ICD-10-CM | POA: Diagnosis not present

## 2022-11-27 DIAGNOSIS — M2141 Flat foot [pes planus] (acquired), right foot: Secondary | ICD-10-CM | POA: Diagnosis not present

## 2022-11-27 DIAGNOSIS — M722 Plantar fascial fibromatosis: Secondary | ICD-10-CM | POA: Diagnosis not present

## 2022-11-27 DIAGNOSIS — M2142 Flat foot [pes planus] (acquired), left foot: Secondary | ICD-10-CM | POA: Diagnosis not present

## 2022-12-21 DIAGNOSIS — N926 Irregular menstruation, unspecified: Secondary | ICD-10-CM | POA: Diagnosis not present

## 2022-12-21 DIAGNOSIS — N921 Excessive and frequent menstruation with irregular cycle: Secondary | ICD-10-CM | POA: Diagnosis not present

## 2023-01-12 ENCOUNTER — Ambulatory Visit: Payer: BC Managed Care – PPO | Admitting: Medical

## 2023-01-12 VITALS — BP 124/72 | HR 80 | Resp 18 | Ht 67.0 in | Wt 238.0 lb

## 2023-01-12 DIAGNOSIS — R1031 Right lower quadrant pain: Secondary | ICD-10-CM | POA: Diagnosis not present

## 2023-01-12 LAB — CBC WITH DIFFERENTIAL/PLATELET
Basophils Absolute: 0.1 10*3/uL (ref 0.0–0.1)
Basophils Relative: 1.3 % (ref 0.0–3.0)
Eosinophils Absolute: 0.2 10*3/uL (ref 0.0–0.7)
Eosinophils Relative: 2.3 % (ref 0.0–5.0)
HCT: 41.2 % (ref 36.0–46.0)
Hemoglobin: 13.3 g/dL (ref 12.0–15.0)
Lymphocytes Relative: 28.2 % (ref 12.0–46.0)
Lymphs Abs: 2.9 10*3/uL (ref 0.7–4.0)
MCHC: 32.4 g/dL (ref 30.0–36.0)
MCV: 92 fL (ref 78.0–100.0)
Monocytes Absolute: 0.7 10*3/uL (ref 0.1–1.0)
Monocytes Relative: 6.4 % (ref 3.0–12.0)
Neutro Abs: 6.4 10*3/uL (ref 1.4–7.7)
Neutrophils Relative %: 61.8 % (ref 43.0–77.0)
Platelets: 310 10*3/uL (ref 150.0–400.0)
RBC: 4.47 Mil/uL (ref 3.87–5.11)
RDW: 12.8 % (ref 11.5–15.5)
WBC: 10.4 10*3/uL (ref 4.0–10.5)

## 2023-01-12 LAB — COMPREHENSIVE METABOLIC PANEL
ALT: 20 U/L (ref 0–35)
AST: 14 U/L (ref 0–37)
Albumin: 4.2 g/dL (ref 3.5–5.2)
Alkaline Phosphatase: 68 U/L (ref 39–117)
BUN: 14 mg/dL (ref 6–23)
CO2: 28 meq/L (ref 19–32)
Calcium: 9.3 mg/dL (ref 8.4–10.5)
Chloride: 105 meq/L (ref 96–112)
Creatinine, Ser: 0.88 mg/dL (ref 0.40–1.20)
GFR: 77.53 mL/min (ref >60.00)
Glucose, Bld: 101 mg/dL — ABNORMAL HIGH (ref 70–99)
Potassium: 4.4 meq/L (ref 3.5–5.1)
Sodium: 140 meq/L (ref 135–145)
Total Bilirubin: 0.4 mg/dL (ref 0.2–1.2)
Total Protein: 7.2 g/dL (ref 6.0–8.3)

## 2023-01-12 LAB — LIPASE: Lipase: 24 U/L (ref 11.0–59.0)

## 2023-01-12 MED ORDER — FAMOTIDINE 20 MG PO TABS: 20.0000 mg | ORAL_TABLET | Freq: Every day | ORAL | 0 refills | Status: DC

## 2023-01-12 NOTE — Addendum Note (Signed)
Addended by: Gwenevere Abbot on: 01/12/2023 10:44 AM   Modules accepted: Orders

## 2023-01-12 NOTE — Progress Notes (Signed)
Subjective:    Patient ID: Lisa Ellis, female    DOB: 08-22-1975, 47 y.o.   MRN: [id number]  HPI Discussed the use of AI scribe software for clinical note transcription with the patient, who gave verbal consent to proceed.  History of Present Illness   The patient has been experiencing intermittent right-sided abdominal discomfort for the past couple of months. The discomfort is not constant but has become more frequent over the past few weeks. The pain is primarily located in the right upper quadrant but also radiates down towards the hip/rlq area. The patient also reports episodes of bloating, particularly after eating, with the stomach feeling tight and somewhat painful. There have been instances of nausea but no episodes of vomiting. This discomfort has been for about 2 months   Also at time patient also reports occasional epigastric pain after eating, suggestive of heartburn, but does not take any medication for this. The patient has a history of blood removal from the umbilical area about ten years ago.        Not pt report gallbladder removed around 2011. She states still has appendix.  Review of Systems  Constitutional:  Negative for chills, fatigue and fever.  Respiratory:  Negative for cough, choking and wheezing.   Cardiovascular:  Negative for chest pain and palpitations.  Gastrointestinal:  Positive for abdominal pain and nausea. Negative for abdominal distention, blood in stool, diarrhea and vomiting.       Occasional nausea and epigstric pain.  Musculoskeletal:  Negative for back pain and myalgias.  Skin:  Negative for rash.  Hematological:  Negative for adenopathy.  Psychiatric/Behavioral:  Negative for behavioral problems and decreased concentration.     Past Medical History:  Diagnosis Date   Allergy    Asthma    Only wheezed on time associated with pneumonia. none since. temporary only   COVID    September 2021   Depression    only had brief episode in 2006  for couple of months when dad passed away.     Social History   Socioeconomic History   Marital status: Married    Spouse name: Not on file   Number of children: Not on file   Years of education: Not on file   Highest education level: 12th grade  Occupational History   Not on file  Tobacco Use   Smoking status: Never   Smokeless tobacco: Never  Vaping Use   Vaping status: Never Used  Substance and Sexual Activity   Alcohol use: No    Alcohol/week: 0.0 standard drinks of alcohol   Drug use: No   Sexual activity: Yes  Other Topics Concern   Not on file  Social History Narrative   Not on file   Social Determinants of Health   Financial Resource Strain: Low Risk  (01/12/2023)   Overall Financial Resource Strain (CARDIA)    Difficulty of Paying Living Expenses: Not very hard  Food Insecurity: No Food Insecurity (01/12/2023)   Hunger Vital Sign    Worried About Running Out of Food in the Last Year: Never true    Ran Out of Food in the Last Year: Never true  Transportation Needs: No Transportation Needs (01/12/2023)   PRAPARE - Administrator, Civil Service (Medical): No    Lack of Transportation (Non-Medical): No  Physical Activity: Unknown (01/12/2023)   Exercise Vital Sign    Days of Exercise per Week: 0 days    Minutes of Exercise per Session: Not  on file  Stress: Stress Concern Present (01/12/2023)   [employer] of Occupational Health - Occupational Stress Questionnaire    Feeling of Stress : To some extent  Social Connections: Moderately Integrated (01/12/2023)   Social Connection and Isolation Panel [NHANES]    Frequency of Communication with Friends and Family: More than three times a week    Frequency of Social Gatherings with Friends and Family: Once a week    Attends Religious Services: 1 to 4 times per year    Active Member of [employer] or Organizations: No    Attends Engineer, structural: Not on file    Marital Status: Married  Careers information officer Violence: Not on file    Past Surgical History:  Procedure Laterality Date   AUGMENTATION MAMMAPLASTY Bilateral 2013   Saline   CHOLECYSTECTOMY     NASAL SINUS SURGERY      Family History  Problem Relation Age of Onset   Hypertension Mother    Diabetes Father    Hypertension Maternal Grandfather    Diabetes Paternal Grandmother    Colon cancer Neg Hx    Esophageal cancer Neg Hx    Rectal cancer Neg Hx    Stomach cancer Neg Hx     No Known Allergies  Current Outpatient Medications on File Prior to Visit  Medication Sig Dispense Refill   hydrOXYzine (ATARAX) 25 MG tablet Take 0.5-1 tablets (12.5-25 mg total) by mouth 3 (three) times daily as needed. 30 tablet 0   aspirin EC 81 MG tablet Take 1 tablet (81 mg total) by mouth daily. 90 tablet 3   [DISCONTINUED] pregabalin (LYRICA) 75 MG capsule Take 1 capsule (75 mg total) by mouth 2 (two) times daily. 60 capsule 0   Current Facility-Administered Medications on File Prior to Visit  Medication Dose Route Frequency Provider Last Rate Last Admin   0.9 %  sodium chloride infusion   Intravenous Once Wallis Bamberg, PA-C        BP 124/72   Pulse 80   Resp 18   Ht 5\' 7"  (1.702 m)   Wt 238 lb (108 kg)   SpO2 98%   BMI 37.28 kg/m        Objective:   Physical Exam  General- No acute distress. Pleasant patient. Neck- Full range of motion, no jvd Lungs- Clear, even and unlabored. Heart- regular rate and rhythm. Neurologic- CNII- XII grossly intact.  Abdomen- faint umbilcal area pain. Mild rt lower quadrant area pain direct on palpation.  No  heel jar pain. No pain on rotation of rt lower ext. No rebound or guarding. Back- no cva tendernes.     Assessment & Plan:   Assessment and Plan    Right Abdominal Pain Intermittent pain for a couple of months, primarily in the right upper quadrant, but also in the right lower quadrant and umbilical area. No clear relation to meals. Associated with bloating and occasional  nausea. Previous ultrasound unremarkable. -Order CBC, metabolic panel, lipase level. -Order CT abdomen and pelvis to evaluate for possible appendicitis.  Epigastric Pain Occasional pain after eating, suggestive of heartburn. -Prescribe famotidine 20mg  PRN for heartburn symptoms.   Recent menorrhagia resolved after tx by gyn.    Follow up date to be determined after lab and imaging review. if delay of imaging due to prior auth or denial then recommend ED evaluation       Esperanza Richters, PA-C

## 2023-01-12 NOTE — Addendum Note (Signed)
Addended by: Gwenevere Abbot on: 01/12/2023 12:53 PM   Modules accepted: Orders

## 2023-01-12 NOTE — Patient Instructions (Signed)
Right Abdominal Pain Intermittent pain for a couple of months, primarily in the right upper quadrant, but also in the right lower quadrant and umbilical area. No clear relation to meals. Associated with bloating and occasional nausea. Previous ultrasound unremarkable. -Order CBC, metabolic panel, lipase level. -Order CT abdomen and pelvis to evaluate for possible appendicitis.  Epigastric Pain Occasional pain after eating, suggestive of heartburn. -Prescribe famotidine 20mg  PRN for heartburn symptoms.   Recent menorrhagia resolved after tx by gyn.    Follow up date to be determined after lab and imaging review. if delay of imaging due to prior auth or denial then recommend ED evaluation

## 2023-01-15 ENCOUNTER — Ambulatory Visit (HOSPITAL_BASED_OUTPATIENT_CLINIC_OR_DEPARTMENT_OTHER)
Admission: RE | Admit: 2023-01-15 | Discharge: 2023-01-15 | Disposition: A | Discharge status: Home / Self Care | Payer: BC Managed Care – PPO | Source: Ambulatory Visit | Attending: Medical | Admitting: Medical

## 2023-01-15 DIAGNOSIS — R1031 Right lower quadrant pain: Secondary | ICD-10-CM | POA: Diagnosis not present

## 2023-01-15 DIAGNOSIS — K5730 Diverticulosis of large intestine without perforation or abscess without bleeding: Secondary | ICD-10-CM | POA: Diagnosis not present

## 2023-01-15 DIAGNOSIS — K760 Fatty (change of) liver, not elsewhere classified: Secondary | ICD-10-CM | POA: Diagnosis not present

## 2023-01-15 DIAGNOSIS — Z9049 Acquired absence of other specified parts of digestive tract: Secondary | ICD-10-CM | POA: Diagnosis not present

## 2023-01-15 MED ORDER — IOHEXOL 300 MG/ML  SOLN
100.0000 mL | Freq: Once | INTRAMUSCULAR | Status: AC | PRN
  Administered 2023-01-15: 100 mL via INTRAVENOUS

## 2023-01-17 ENCOUNTER — Encounter: Payer: Self-pay | Admitting: Medical

## 2023-01-18 NOTE — Telephone Encounter (Signed)
Spoke w/ rad asst at reading room- she will get CT read for you.

## 2023-01-18 NOTE — Telephone Encounter (Signed)
Would you call downstairs radiology dept and ask if they would get ct abd/pelvis read stat. I had originally ordered as stat. Then had to change the location to [facility]. Looks like when I placed the new order did not order stat. So please get them to read stat. Please give me update they read status changed by late this morning.

## 2023-02-14 ENCOUNTER — Other Ambulatory Visit: Payer: Self-pay | Admitting: Medical

## 2023-02-16 ENCOUNTER — Telehealth: Payer: BC Managed Care – PPO | Admitting: Nurse Practitioner

## 2023-02-16 DIAGNOSIS — J069 Acute upper respiratory infection, unspecified: Secondary | ICD-10-CM

## 2023-02-16 MED ORDER — PSEUDOEPH-BROMPHEN-DM 30-2-10 MG/5ML PO SYRP: 5.0000 mL | ORAL_SOLUTION | Freq: Four times a day (QID) | ORAL | 0 refills | Status: DC | PRN

## 2023-02-16 MED ORDER — IPRATROPIUM BROMIDE 0.03 % NA SOLN: 2.0000 | Freq: Two times a day (BID) | NASAL | 12 refills | Status: DC

## 2023-02-16 NOTE — Progress Notes (Signed)
E-Visit for Upper Respiratory Infection   We are sorry you are not feeling well.  Here is how we plan to help!  Based on what you have shared with me, it looks like you may have a viral upper respiratory infection.  Upper respiratory infections are caused by a large number of viruses; however, rhinovirus is the most common cause.   We do not typically recommend antibiotics for sinus symptoms prior to 7-10 days of consistent symptoms  Providers prescribe antibiotics to treat infections caused by bacteria. Antibiotics are very powerful in treating bacterial infections when they are used properly. To maintain their effectiveness, they should be used only when necessary. Overuse of antibiotics has resulted in the development of superbugs that are resistant to treatment!    After careful review of your answers, I would not recommend an antibiotic for your condition.  Antibiotics are not effective against viruses and therefore should not be used to treat them. Common examples of infections caused by viruses include colds and flu   Symptoms vary from person to person, with common symptoms including sore throat, cough, fatigue or lack of energy and feeling of general discomfort.  A low-grade fever of up to 100.4 may present, but is often uncommon.  Symptoms vary however, and are closely related to a person's age or underlying illnesses.  The most common symptoms associated with an upper respiratory infection are nasal discharge or congestion, cough, sneezing, headache and pressure in the ears and face.  These symptoms usually persist for about 3 to 10 days, but can last up to 2 weeks.  It is important to know that upper respiratory infections do not cause serious illness or complications in most cases.    Upper respiratory infections can be transmitted from person to person, with the most common method of transmission being a person's hands.  The virus is able to live on the skin and can infect other persons  for up to 2 hours after direct contact.  Also, these can be transmitted when someone coughs or sneezes; thus, it is important to cover the mouth to reduce this risk.  To keep the spread of the illness at bay, good hand hygiene is very important.  This is an infection that is most likely caused by a virus. There are no specific treatments other than to help you with the symptoms until the infection runs its course.  We are sorry you are not feeling well.  Here is how we plan to help!   For nasal congestion, you may use an oral decongestants such as Mucinex D or if you have glaucoma or high blood pressure use plain Mucinex.  Saline nasal spray or nasal drops can help and can safely be used as often as needed for congestion.  For your congestion, I have prescribed Ipratropium Bromide nasal spray 0.03% two sprays in each nostril 2-3 times a day  If you do not have a history of heart disease, hypertension, diabetes or thyroid disease, prostate/bladder issues or glaucoma, you may also use Sudafed to treat nasal congestion.  It is highly recommended that you consult with a pharmacist or your primary care physician to ensure this medication is safe for you to take.     For your cough and congestion we will prescribe:   If you have a sore or scratchy throat, use a saltwater gargle-  to  teaspoon of salt dissolved in a 4-ounce to 8-ounce glass of warm water.  Gargle the solution for approximately 15-30  seconds and then spit.  It is important not to swallow the solution.  You can also use throat lozenges/cough drops and Chloraseptic spray to help with throat pain or discomfort.  Warm or cold liquids can also be helpful in relieving throat pain.  For headache, pain or general discomfort, you can use Ibuprofen or Tylenol as directed.   Some authorities believe that zinc sprays or the use of Echinacea may shorten the course of your symptoms.   HOME CARE Only take medications as instructed by your medical  team. Be sure to drink plenty of fluids. Water is fine as well as fruit juices, sodas and electrolyte beverages. You may want to stay away from caffeine or alcohol. If you are nauseated, try taking small sips of liquids. How do you know if you are getting enough fluid? Your urine should be a pale yellow or almost colorless. Get rest. Taking a steamy shower or using a humidifier may help nasal congestion and ease sore throat pain. You can place a towel over your head and breathe in the steam from hot water coming from a faucet. Using a saline nasal spray works much the same way. Cough drops, hard candies and sore throat lozenges may ease your cough. Avoid close contacts especially the very young and the elderly Cover your mouth if you cough or sneeze Always remember to wash your hands.   GET HELP RIGHT AWAY IF: You develop worsening fever. If your symptoms do not improve within 10 days You develop yellow or green discharge from your nose over 3 days. You have coughing fits You develop a severe head ache or visual changes. You develop shortness of breath, difficulty breathing or start having chest pain Your symptoms persist after you have completed your treatment plan  MAKE SURE YOU  Understand these instructions. Will watch your condition. Will get help right away if you are not doing well or get worse.  Thank you for choosing an e-visit.  Your e-visit answers were reviewed by a board certified advanced clinical practitioner to complete your personal care plan. Depending upon the condition, your plan could have included both over the counter or prescription medications.  Please review your pharmacy choice. Make sure the pharmacy is open so you can pick up prescription now. If there is a problem, you may contact your provider through [employer] and have the prescription routed to another pharmacy.  Your safety is important to [place]. If you have drug allergies check your prescription  carefully.   For the next 24 hours you can use MyChart to ask questions about today's visit, request a non-urgent call back, or ask for a work or school excuse. You will get an email in the next two days asking about your experience. I hope that your e-visit has been valuable and will speed your recovery.  Meds ordered this encounter  Medications   brompheniramine-pseudoephedrine-DM 30-2-10 MG/5ML syrup    Sig: Take 5 mLs by mouth 4 (four) times daily as needed (cough and congestion).    Dispense:  120 mL    Refill:  0   ipratropium (ATROVENT) 0.03 % nasal spray    Sig: Place 2 sprays into both nostrils every 12 (twelve) hours.    Dispense:  30 mL    Refill:  12    I spent approximately 5 minutes reviewing the patient's history, current symptoms and coordinating their care today.

## 2023-02-25 ENCOUNTER — Telehealth: Payer: BC Managed Care – PPO | Admitting: Family

## 2023-02-25 DIAGNOSIS — B9689 Other specified bacterial agents as the cause of diseases classified elsewhere: Secondary | ICD-10-CM

## 2023-02-25 DIAGNOSIS — J208 Acute bronchitis due to other specified organisms: Secondary | ICD-10-CM

## 2023-02-25 MED ORDER — AZITHROMYCIN 250 MG PO TABS: ORAL_TABLET | ORAL | 0 refills | Status: DC

## 2023-02-25 MED ORDER — BENZONATATE 100 MG PO CAPS: 100.0000 mg | ORAL_CAPSULE | Freq: Three times a day (TID) | ORAL | 0 refills | Status: DC | PRN

## 2023-02-25 NOTE — Progress Notes (Signed)
E-Visit for Cough  We are sorry that you are not feeling well.  Here is how we plan to help!  Based on your presentation I believe you most likely have A cough due to bacteria.  When patients have a fever and a productive cough with a change in color or increased sputum production, we are concerned about bacterial bronchitis.  If left untreated it can progress to pneumonia.  If your symptoms do not improve with your treatment plan it is important that you contact your provider.   I have prescribed Azithromyin 250 mg: two tablets now and then one tablet daily for 4 additonal days    In addition you may use A non-prescription cough medication called Robitussin DAC. Take 2 teaspoons every 8 hours or Delsym: take 2 teaspoons every 12 hours., A non-prescription cough medication called Mucinex DM: take 2 tablets every 12 hours., and A prescription cough medication called Tessalon Perles 100mg. You may take 1-2 capsules every 8 hours as needed for your cough.    From your responses in the eVisit questionnaire you describe inflammation in the upper respiratory tract which is causing a significant cough.  This is commonly called Bronchitis and has four common causes:   Allergies Viral Infections Acid Reflux Bacterial Infection Allergies, viruses and acid reflux are treated by controlling symptoms or eliminating the cause. An example might be a cough caused by taking certain blood pressure medications. You stop the cough by changing the medication. Another example might be a cough caused by acid reflux. Controlling the reflux helps control the cough.  USE OF BRONCHODILATOR ("RESCUE") INHALERS: There is a risk from using your bronchodilator too frequently.  The risk is that over-reliance on a medication which only relaxes the muscles surrounding the breathing tubes can reduce the effectiveness of medications prescribed to reduce swelling and congestion of the tubes themselves.  Although you feel brief relief  from the bronchodilator inhaler, your asthma may actually be worsening with the tubes becoming more swollen and filled with mucus.  This can delay other crucial treatments, such as oral steroid medications. If you need to use a bronchodilator inhaler daily, several times per day, you should discuss this with your provider.  There are probably better treatments that could be used to keep your asthma under control.     HOME CARE Only take medications as instructed by your medical team. Complete the entire course of an antibiotic. Drink plenty of fluids and get plenty of rest. Avoid close contacts especially the very young and the elderly Cover your mouth if you cough or cough into your sleeve. Always remember to wash your hands A steam or ultrasonic humidifier can help congestion.   GET HELP RIGHT AWAY IF: You develop worsening fever. You become short of breath You cough up blood. Your symptoms persist after you have completed your treatment plan MAKE SURE YOU  Understand these instructions. Will watch your condition. Will get help right away if you are not doing well or get worse.    Thank you for choosing an e-visit.  Your e-visit answers were reviewed by a board certified advanced clinical practitioner to complete your personal care plan. Depending upon the condition, your plan could have included both over the counter or prescription medications.  Please review your pharmacy choice. Make sure the pharmacy is open so you can pick up prescription now. If there is a problem, you may contact your provider through MyChart messaging and have the prescription routed to another pharmacy.    Your safety is important to us. If you have drug allergies check your prescription carefully.   For the next 24 hours you can use MyChart to ask questions about today's visit, request a non-urgent call back, or ask for a work or school excuse. You will get an email in the next two days asking about your  experience. I hope that your e-visit has been valuable and will speed your recovery.  Approximately 5 minutes was spent documenting and reviewing patient's chart.   

## 2023-03-21 ENCOUNTER — Telehealth: Payer: BC Managed Care – PPO | Admitting: Physician Assistant

## 2023-03-21 ENCOUNTER — Other Ambulatory Visit (HOSPITAL_BASED_OUTPATIENT_CLINIC_OR_DEPARTMENT_OTHER): Payer: Self-pay | Admitting: Medical

## 2023-03-21 DIAGNOSIS — Z1231 Encounter for screening mammogram for malignant neoplasm of breast: Secondary | ICD-10-CM

## 2023-03-21 DIAGNOSIS — J208 Acute bronchitis due to other specified organisms: Secondary | ICD-10-CM | POA: Diagnosis not present

## 2023-03-21 MED ORDER — PREDNISONE 20 MG PO TABS: 40.0000 mg | ORAL_TABLET | Freq: Every day | ORAL | 0 refills | Status: DC

## 2023-03-21 MED ORDER — BENZONATATE 100 MG PO CAPS: 100.0000 mg | ORAL_CAPSULE | Freq: Three times a day (TID) | ORAL | 0 refills | Status: DC | PRN

## 2023-03-21 NOTE — Progress Notes (Signed)
 I have spent 5 minutes in review of e-visit questionnaire, review and updating patient chart, medical decision making and response to patient.   Piedad Climes, PA-C

## 2023-03-21 NOTE — Progress Notes (Signed)
 E-Visit for Cough   We are sorry that you are not feeling well.  Here is how we plan to help!  Based on your presentation I believe you most likely have A cough due to a virus.  This is called viral bronchitis and is best treated by rest, plenty of fluids and control of the cough.  You may use Ibuprofen  or Tylenol  as directed to help your symptoms.     In addition you may use A prescription cough medication called Tessalon  Perles 100mg . You may take 1-2 capsules every 8 hours as needed for your cough.  I have also sent in a short course of prednisone  to take as directed. Start a saline nasal rinse at night and in the morning as well. If you have a humidifier, run it in the bedroom at night.  From your responses in the eVisit questionnaire you describe inflammation in the upper respiratory tract which is causing a significant cough.  This is commonly called Bronchitis and has four common causes:   Allergies Viral Infections Acid Reflux Bacterial Infection Allergies, viruses and acid reflux are treated by controlling symptoms or eliminating the cause. An example might be a cough caused by taking certain blood pressure medications. You stop the cough by changing the medication. Another example might be a cough caused by acid reflux. Controlling the reflux helps control the cough.  USE OF BRONCHODILATOR (RESCUE) INHALERS: There is a risk from using your bronchodilator too frequently.  The risk is that over-reliance on a medication which only relaxes the muscles surrounding the breathing tubes can reduce the effectiveness of medications prescribed to reduce swelling and congestion of the tubes themselves.  Although you feel brief relief from the bronchodilator inhaler, your asthma may actually be worsening with the tubes becoming more swollen and filled with mucus.  This can delay other crucial treatments, such as oral steroid medications. If you need to use a bronchodilator inhaler daily, several  times per day, you should discuss this with your provider.  There are probably better treatments that could be used to keep your asthma under control.     HOME CARE Only take medications as instructed by your medical team. Complete the entire course of an antibiotic. Drink plenty of fluids and get plenty of rest. Avoid close contacts especially the very young and the elderly Cover your mouth if you cough or cough into your sleeve. Always remember to wash your hands A steam or ultrasonic humidifier can help congestion.   GET HELP RIGHT AWAY IF: You develop worsening fever. You become short of breath You cough up blood. Your symptoms persist after you have completed your treatment plan MAKE SURE YOU  Understand these instructions. Will watch your condition. Will get help right away if you are not doing well or get worse.    Thank you for choosing an e-visit.  Your e-visit answers were reviewed by a board certified advanced clinical practitioner to complete your personal care plan. Depending upon the condition, your plan could have included both over the counter or prescription medications.  Please review your pharmacy choice. Make sure the pharmacy is open so you can pick up prescription now. If there is a problem, you may contact your provider through [employer] and have the prescription routed to another pharmacy.  Your safety is important to us . If you have drug allergies check your prescription carefully.   For the next 24 hours you can use MyChart to ask questions about today's visit, request  a non-urgent call back, or ask for a work or school excuse. You will get an email in the next two days asking about your experience. I hope that your e-visit has been valuable and will speed your recovery.

## 2023-04-20 ENCOUNTER — Ambulatory Visit: Payer: Self-pay | Admitting: Medical

## 2023-04-20 NOTE — Telephone Encounter (Signed)
 Copied from CRM [id number]. Topic: Clinical - Red Word Triage >> Apr 20, 2023  1:38 PM Viola FALCON wrote: Red Word that prompted transfer to Nurse Triage: Patient has been having chest aches, and headaches, blood pressure now is 154/190   Chief Complaint: Chest Aching Symptoms: Fatigue,  Frequency: Acute Pertinent Negatives: Patient denies neurological deficit, diaphoresis.  Disposition: [x] ED /[x] Urgent Care (no appt availability in office) / [] Appointment(In office/virtual)/ []  [facility]/ [] Home Care/ [] Refused Recommended Disposition /[] [facility]/ []  Follow-up with PCP Additional Notes: WM is being triaged for chest aches and discomfort. The patient reports feeling flushed and having pain across the chest that radiates into the right jaw. Reports mild dyspnea. History of initiation of a hormone for irregular periods. Patient has a family history of hypertension. Due to presentation of symptoms and telephonic triage assessment. Recommended ER evaluation, the patient does not initially want to go to the nearest ER, and states she will go to the UC, if referred to the ER she will go to the next ER. PCP office to be notified. Please follow up with patient.      Reason for Disposition  Pain also in shoulder(s) or arm(s) or jaw  (Exception: Pain is clearly made worse by movement.)  Answer Assessment - Initial Assessment Questions 1. LOCATION: Where does it hurt?       Across the chest  2. RADIATION: Does the pain go anywhere else? (e.g., into neck, jaw, arms, back)     Right Jaw  3. ONSET: When did the chest pain begin? (Minutes, hours or days)      Acute  4. PATTERN: Does the pain come and go, or has it been constant since it started?  Does it get worse with exertion?      Intermittent  5. DURATION: How long does it last (e.g., seconds, minutes, hours)     Hours  6. SEVERITY: How bad is the pain?  (e.g., Scale 1-10; mild, moderate, or severe)    -  MILD (1-3): doesn't interfere with normal activities     - MODERATE (4-7): interferes with normal activities or awakens from sleep    - SEVERE (8-10): excruciating pain, unable to do any normal activities       5  7. CARDIAC RISK FACTORS: Do you have any history of heart problems or risk factors for heart disease? (e.g., angina, prior heart attack; diabetes, high blood pressure, high cholesterol, smoker, or strong family history of heart disease)     Family History of HTN, no current heart history  8. PULMONARY RISK FACTORS: Do you have any history of lung disease?  (e.g., blood clots in lung, asthma, emphysema, birth control pills)     No  9. CAUSE: What do you think is causing the chest pain?     Unsure  10. OTHER SYMPTOMS: Do you have any other symptoms? (e.g., dizziness, nausea, vomiting, sweating, fever, difficulty breathing, cough)       Rash, Headache, Fatigue, Dyspnea  11. PREGNANCY: Is there any chance you are pregnant? When was your last menstrual period?       No, Unsure  Protocols used: Chest Pain-A-AH

## 2023-04-23 ENCOUNTER — Ambulatory Visit (HOSPITAL_BASED_OUTPATIENT_CLINIC_OR_DEPARTMENT_OTHER)
Admission: RE | Admit: 2023-04-23 | Discharge: 2023-04-23 | Disposition: A | Discharge status: Home / Self Care | Payer: BC Managed Care – PPO | Source: Ambulatory Visit | Attending: Medical | Admitting: Medical

## 2023-04-23 ENCOUNTER — Encounter: Payer: Self-pay | Admitting: Medical

## 2023-04-23 ENCOUNTER — Ambulatory Visit: Payer: BC Managed Care – PPO | Admitting: Medical

## 2023-04-23 VITALS — BP 150/80 | HR 85 | Resp 18 | Ht 67.0 in | Wt 241.0 lb

## 2023-04-23 DIAGNOSIS — R0789 Other chest pain: Secondary | ICD-10-CM | POA: Diagnosis not present

## 2023-04-23 DIAGNOSIS — R739 Hyperglycemia, unspecified: Secondary | ICD-10-CM

## 2023-04-23 DIAGNOSIS — R079 Chest pain, unspecified: Secondary | ICD-10-CM

## 2023-04-23 DIAGNOSIS — R059 Cough, unspecified: Secondary | ICD-10-CM

## 2023-04-23 DIAGNOSIS — R0989 Other specified symptoms and signs involving the circulatory and respiratory systems: Secondary | ICD-10-CM

## 2023-04-23 DIAGNOSIS — R5383 Other fatigue: Secondary | ICD-10-CM

## 2023-04-23 DIAGNOSIS — J40 Bronchitis, not specified as acute or chronic: Secondary | ICD-10-CM | POA: Diagnosis not present

## 2023-04-23 DIAGNOSIS — R790 Abnormal level of blood mineral: Secondary | ICD-10-CM

## 2023-04-23 DIAGNOSIS — I10 Essential (primary) hypertension: Secondary | ICD-10-CM | POA: Diagnosis not present

## 2023-04-23 MED ORDER — AZITHROMYCIN 250 MG PO TABS
ORAL_TABLET | ORAL | 0 refills | Status: AC
Start: 2023-04-23 — End: 2023-04-28

## 2023-04-23 MED ORDER — METHYLPREDNISOLONE 4 MG PO TABS: ORAL_TABLET | ORAL | 0 refills | Status: DC

## 2023-04-23 MED ORDER — LOSARTAN POTASSIUM 25 MG PO TABS: 25.0000 mg | ORAL_TABLET | Freq: Every day | ORAL | 0 refills | Status: DC

## 2023-04-23 MED ORDER — BENZONATATE 100 MG PO CAPS: 100.0000 mg | ORAL_CAPSULE | Freq: Three times a day (TID) | ORAL | 0 refills | Status: DC | PRN

## 2023-04-23 NOTE — Patient Instructions (Addendum)
 Hypertension Recent elevated blood pressure readings, possibly related to medroxyprogesterone use. -Start Losartan  25mg  daily, monitor blood pressure, and adjust dose as needed.  Atypical Chest Pain History of costochondritis, recent chest discomfort for 1-2 weeks, reproducible on palpation, no acute distress. EKG nsr. No ischemia. -Order troponin level.Stat for caution -Refer to cardiologist for further evaluation. prior work up in 2019.  -Consider cardiac CT and calcium score based on cardiologist's recommendation. -Prescribe Medrol  4mg  6-day taper to assess response.  Menstrual Irregularities Prolonged bleeding, severe pain, and other side effects possibly related to medroxyprogesterone. -Discontinue medroxyprogesterone.  Fatigue Ongoing fatigue, possibly related to medroxyprogesterone or other underlying conditions. -Order fatigue labs including CBC, iron  panel, metabolic panel, TSH, B12, and B1.  Respiratory Symptoms History of bronchitis, current chest congestion, and throat clearing, particularly when lying flat. -Order chest x-ray.  Hyperglycemia History of elevated blood glucose levels. -Order HbA1c.  Follow-up in 7-10 days to review lab results, blood pressure readings, and chest pain status.

## 2023-04-23 NOTE — Addendum Note (Signed)
 Addended by: Serafina Damme on: 04/23/2023 08:31 PM   Modules accepted: Orders

## 2023-04-23 NOTE — Progress Notes (Signed)
 Subjective:    Patient ID: Lisa Ellis, female    DOB: Apr 15, 1975, 48 y.o.   MRN: [id number]  HPI Discussed the use of AI scribe software for clinical note transcription with the patient, who gave verbal consent to proceed.  History of Present Illness   Lisa Ellis is a 48 year old female with hypertension and menstrual irregularities who presents with chest congestion for a month, fatigue , and elevated blood pressure.  She has been experiencing chest congestion for the past one to two weeks, characterized by a crackling sound when lying on her back, which improves when lying on her side. There is no productive cough, but frequent throat clearing is noted. She has a history of upper respiratory infections, including viral and bacterial bronchitis, treated with a Z-Pak in December(had 3 video vists in dec to [place]). Despite treatment, she continues to experience chest congestion and throat clearing, particularly when lying flat.  She has been monitoring her blood pressure at home, noting readings as high as 157/90 mmHg. Historically, her blood pressure has been around 120/80 mmHg or lower, even when ill. She attributes the recent elevation in blood pressure to a medication prescribed medroxyprogesterone by her OB GYN to regulate her menstrual cycle.  She has experienced chest pain described as soreness and achiness across the top of her chest, which worsens with palpation. This pain has been present for about one to two weeks and is not associated with exertion or deep breathing. She has a past diagnosis of costochondritis in 2019. A previous cardiac workup, including a stress test and echocardiogram, showed normal left ventricular function and fair exercise capacity.  Regarding her menstrual irregularities, she was prescribed a medication she identifies as 'my medroxyprogesterone' to manage prolonged bleeding episodes. She has been taking this medication in a cycle of three weeks on and one week  off for the past three months. Despite this, she continues to experience spotting and significant pain during the off weeks, particularly in the pelvic area. She reports bloating, fatigue, headaches, blurry vision, and weight gain during the most recent off week.  She reports persistent fatigue over the past week, feeling unusually tired and lacking motivation to socialize. She attributes these symptoms to the medication prescribed for her menstrual cycle.         Review of Systems  Constitutional:  Positive for fatigue.  Respiratory:  Negative for choking, shortness of breath and wheezing.   Cardiovascular:  Negative for chest pain and palpitations.  Gastrointestinal:  Negative for abdominal pain and blood in stool.  Genitourinary:  Negative for dyspareunia and dysuria.  Musculoskeletal:  Negative for arthralgias and back pain.       Costochandral tenderness on exam and some reported low level discomort across chest for 2 weeks.  Skin:  Negative for rash.  Hematological:  Negative for adenopathy. Does not bruise/bleed easily.  Psychiatric/Behavioral:  Negative for behavioral problems and confusion.    Past Medical History:  Diagnosis Date   Allergy    Asthma    Only wheezed on time associated with pneumonia. none since. temporary only   COVID    September 2021   Depression    only had brief episode in 2006 for couple of months when dad passed away.     Social History   Socioeconomic History   Marital status: Married    Spouse name: Not on file   Number of children: Not on file   Years of education: Not on file  Highest education level: 12th grade  Occupational History   Not on file  Tobacco Use   Smoking status: Never   Smokeless tobacco: Never  Vaping Use   Vaping status: Never Used  Substance and Sexual Activity   Alcohol use: No    Alcohol/week: 0.0 standard drinks of alcohol   Drug use: No   Sexual activity: Yes  Other Topics Concern   Not on file  Social  History Narrative   Not on file   Social Drivers of Health   Financial Resource Strain: Low Risk  (04/21/2023)   Overall Financial Resource Strain (CARDIA)    Difficulty of Paying Living Expenses: Not hard at all  Food Insecurity: No Food Insecurity (04/21/2023)   Hunger Vital Sign    Worried About Running Out of Food in the Last Year: Never true    Ran Out of Food in the Last Year: Never true  Transportation Needs: No Transportation Needs (04/21/2023)   PRAPARE - Administrator, Civil Service (Medical): No    Lack of Transportation (Non-Medical): No  Physical Activity: Unknown (04/21/2023)   Exercise Vital Sign    Days of Exercise per Week: 0 days    Minutes of Exercise per Session: Not on file  Stress: Stress Concern Present (04/21/2023)   [employer] of Occupational Health - Occupational Stress Questionnaire    Feeling of Stress : To some extent  Social Connections: Moderately Isolated (04/21/2023)   Social Connection and Isolation Panel [NHANES]    Frequency of Communication with Friends and Family: More than three times a week    Frequency of Social Gatherings with Friends and Family: Once a week    Attends Religious Services: Never    Database administrator or Organizations: No    Attends Engineer, structural: Not on file    Marital Status: Married  Catering manager Violence: Not on file    Past Surgical History:  Procedure Laterality Date   AUGMENTATION MAMMAPLASTY Bilateral 2013   Saline   CHOLECYSTECTOMY     NASAL SINUS SURGERY      Family History  Problem Relation Age of Onset   Hypertension Mother    Diabetes Father    Hypertension Maternal Grandfather    Diabetes Paternal Grandmother    Colon cancer Neg Hx    Esophageal cancer Neg Hx    Rectal cancer Neg Hx    Stomach cancer Neg Hx     Allergies  Allergen Reactions   Medroxyprogesterone     Weight gain  Bloating Vision changes Fatigue  Mood swings    Current Outpatient  Medications on File Prior to Visit  Medication Sig Dispense Refill   aspirin  EC 81 MG tablet Take 1 tablet (81 mg total) by mouth daily. 90 tablet 3   ipratropium (ATROVENT ) 0.03 % nasal spray Place 2 sprays into both nostrils every 12 (twelve) hours. 30 mL 12   [DISCONTINUED] pregabalin  (LYRICA ) 75 MG capsule Take 1 capsule (75 mg total) by mouth 2 (two) times daily. 60 capsule 0   Current Facility-Administered Medications on File Prior to Visit  Medication Dose Route Frequency Provider Last Rate Last Admin   0.9 %  sodium chloride  infusion   Intravenous Once Adolph Hoop, PA-C        BP (!) 158/80   Pulse 85   Resp 18   Ht 5\' 7"  (1.702 m)   Wt 241 lb (109.3 kg)   SpO2 98%   BMI 37.75 kg/m  150/80 on recheck.        Objective:   Physical Exam  General Mental Status- Alert. General Appearance- Not in acute distress.   Skin General: Color- Normal Color. Moisture- Normal Moisture.  Neck Carotid Arteries- Normal color. Moisture- Normal Moisture. No carotid bruits. No JVD.  Chest and Lung Exam Auscultation: Breath Sounds:-Normal.  Cardiovascular Auscultation:Rythm- Regular. Murmurs & Other Heart Sounds:Auscultation of the heart reveals- No Murmurs.  Abdomen Inspection:-Inspeection Normal. Palpation/Percussion:Note:No mass. Palpation and Percussion of the abdomen reveal- Non Tender, Non Distended + BS, no rebound or guarding.   Neurologic Cranial Nerve exam:- CN III-XII intact(No nystagmus), symmetric smile. Strength:- 5/5 equal and symmetric strength both upper and lower extremities.    Lower ext- calf symmetric, no pedal edema. Negative homans signs bilaterally.  Anterior thorax- left side costochondral junction tenderness to palpation    Assessment & Plan:   Patient Instructions  Hypertension Recent elevated blood pressure readings, possibly related to medroxyprogesterone use. -Start Losartan  25mg  daily, monitor blood pressure, and adjust dose as  needed.  Atypical Chest Pain History of costochondritis, recent chest discomfort for 1-2 weeks, reproducible on palpation, no acute distress. EKG nsr. No ischemia. -Order troponin level.Stat for caution -Refer to cardiologist for further evaluation. prior work up in 2019.  -Consider cardiac CT and calcium score based on cardiologist's recommendation. -Prescribe Medrol  4mg  6-day taper to assess response.  Menstrual Irregularities Prolonged bleeding, severe pain, and other side effects possibly related to medroxyprogesterone. -Discontinue medroxyprogesterone.  Fatigue Ongoing fatigue, possibly related to medroxyprogesterone or other underlying conditions. -Order fatigue labs including CBC, iron  panel, metabolic panel, TSH, B12, and B1.  Respiratory Symptoms History of bronchitis, current chest congestion, and throat clearing, particularly when lying flat. -Order chest x-ray.  Hyperglycemia History of elevated blood glucose levels. -Order HbA1c.  Follow-up in 7-10 days to review lab results, blood pressure readings, and chest pain status.   Adah Stoneberg, PA-C    Time spent with patient today was  43 minutes which consisted of chart revdew, discussing diagnosis, work up ,treatment and documentation.

## 2023-04-24 LAB — COMPREHENSIVE METABOLIC PANEL
ALT: 26 U/L (ref 0–35)
AST: 16 U/L (ref 0–37)
Albumin: 4.6 g/dL (ref 3.5–5.2)
Alkaline Phosphatase: 73 U/L (ref 39–117)
BUN: 15 mg/dL (ref 6–23)
CO2: 29 meq/L (ref 19–32)
Calcium: 9.8 mg/dL (ref 8.4–10.5)
Chloride: 102 meq/L (ref 96–112)
Creatinine, Ser: 0.81 mg/dL (ref 0.40–1.20)
GFR: 85.48 mL/min (ref >60.00)
Glucose, Bld: 106 mg/dL — ABNORMAL HIGH (ref 70–99)
Potassium: 4.2 meq/L (ref 3.5–5.1)
Sodium: 140 meq/L (ref 135–145)
Total Bilirubin: 0.3 mg/dL (ref 0.2–1.2)
Total Protein: 7.4 g/dL (ref 6.0–8.3)

## 2023-04-24 LAB — CBC WITH DIFFERENTIAL/PLATELET
Basophils Absolute: 0.1 10*3/uL (ref 0.0–0.1)
Basophils Relative: 1.2 % (ref 0.0–3.0)
Eosinophils Absolute: 0.3 10*3/uL (ref 0.0–0.7)
Eosinophils Relative: 4 % (ref 0.0–5.0)
HCT: 42.7 % (ref 36.0–46.0)
Hemoglobin: 14.1 g/dL (ref 12.0–15.0)
Lymphocytes Relative: 35.9 % (ref 12.0–46.0)
Lymphs Abs: 2.9 10*3/uL (ref 0.7–4.0)
MCHC: 33 g/dL (ref 30.0–36.0)
MCV: 86.2 fL (ref 78.0–100.0)
Monocytes Absolute: 0.7 10*3/uL (ref 0.1–1.0)
Monocytes Relative: 8.5 % (ref 3.0–12.0)
Neutro Abs: 4.1 10*3/uL (ref 1.4–7.7)
Neutrophils Relative %: 50.4 % (ref 43.0–77.0)
Platelets: 335 10*3/uL (ref 150.0–400.0)
RBC: 4.96 Mil/uL (ref 3.87–5.11)
RDW: 13.3 % (ref 11.5–15.5)
WBC: 8.2 10*3/uL (ref 4.0–10.5)

## 2023-04-24 LAB — VITAMIN B12: Vitamin B-12: 421 pg/mL (ref 211–911)

## 2023-04-24 LAB — IRON,TIBC AND FERRITIN PANEL
%SAT: 13 % — ABNORMAL LOW (ref 16–45)
Ferritin: 10 ng/mL — ABNORMAL LOW (ref 16–232)
Iron: 62 ug/dL (ref 40–190)
TIBC: 480 ug/dL — ABNORMAL HIGH (ref 250–450)

## 2023-04-24 LAB — TSH: TSH: 1.18 u[IU]/mL (ref 0.35–5.50)

## 2023-04-24 MED ORDER — IRON (FERROUS SULFATE) 325 (65 FE) MG PO TABS: 325.0000 mg | ORAL_TABLET | Freq: Every day | ORAL | 0 refills | Status: DC

## 2023-04-24 NOTE — Addendum Note (Signed)
Addended by: Gwenevere Abbot on: 04/24/2023 12:01 PM   Modules accepted: Orders

## 2023-04-28 LAB — VITAMIN B1: Vitamin B1 (Thiamine): 15 nmol/L (ref 8–30)

## 2023-04-28 LAB — TROPONIN I: Troponin I: <3 ng/L (ref <=47)

## 2023-04-30 ENCOUNTER — Ambulatory Visit (HOSPITAL_BASED_OUTPATIENT_CLINIC_OR_DEPARTMENT_OTHER)
Admission: RE | Admit: 2023-04-30 | Discharge: 2023-04-30 | Disposition: A | Discharge status: Home / Self Care | Payer: BC Managed Care – PPO | Source: Ambulatory Visit | Attending: Medical | Admitting: Medical

## 2023-04-30 ENCOUNTER — Encounter (HOSPITAL_BASED_OUTPATIENT_CLINIC_OR_DEPARTMENT_OTHER): Payer: Self-pay

## 2023-04-30 DIAGNOSIS — Z1231 Encounter for screening mammogram for malignant neoplasm of breast: Secondary | ICD-10-CM | POA: Insufficient documentation

## 2023-05-03 ENCOUNTER — Encounter: Payer: Self-pay | Admitting: Medical

## 2023-05-14 DIAGNOSIS — L700 Acne vulgaris: Secondary | ICD-10-CM | POA: Diagnosis not present

## 2023-05-14 DIAGNOSIS — L739 Follicular disorder, unspecified: Secondary | ICD-10-CM | POA: Diagnosis not present

## 2023-05-16 ENCOUNTER — Other Ambulatory Visit: Payer: Self-pay | Admitting: Medical

## 2023-05-24 ENCOUNTER — Other Ambulatory Visit (INDEPENDENT_AMBULATORY_CARE_PROVIDER_SITE_OTHER)

## 2023-05-24 DIAGNOSIS — R790 Abnormal level of blood mineral: Secondary | ICD-10-CM | POA: Diagnosis not present

## 2023-05-24 LAB — IRON,TIBC AND FERRITIN PANEL
%SAT: 20 % (ref 16–45)
Ferritin: 18 ng/mL (ref 16–232)
Iron: 100 ug/dL (ref 40–190)
TIBC: 488 ug/dL — ABNORMAL HIGH (ref 250–450)

## 2023-05-26 ENCOUNTER — Encounter: Payer: Self-pay | Admitting: Medical

## 2023-05-26 NOTE — Addendum Note (Signed)
 Addended by: Gwenevere Abbot on: 05/26/2023 08:36 AM   Modules accepted: Orders

## 2023-05-29 DIAGNOSIS — J324 Chronic pansinusitis: Secondary | ICD-10-CM | POA: Diagnosis not present

## 2023-05-29 DIAGNOSIS — J310 Chronic rhinitis: Secondary | ICD-10-CM | POA: Diagnosis not present

## 2023-06-01 ENCOUNTER — Other Ambulatory Visit: Payer: Self-pay

## 2023-06-01 DIAGNOSIS — J45909 Unspecified asthma, uncomplicated: Secondary | ICD-10-CM | POA: Insufficient documentation

## 2023-06-01 DIAGNOSIS — T7840XA Allergy, unspecified, initial encounter: Secondary | ICD-10-CM | POA: Insufficient documentation

## 2023-06-01 DIAGNOSIS — F32A Depression, unspecified: Secondary | ICD-10-CM | POA: Insufficient documentation

## 2023-06-01 DIAGNOSIS — U071 COVID-19: Secondary | ICD-10-CM | POA: Insufficient documentation

## 2023-06-07 ENCOUNTER — Ambulatory Visit: Payer: BC Managed Care – PPO | Attending: Cardiology | Admitting: Cardiology

## 2023-06-07 ENCOUNTER — Encounter: Payer: Self-pay | Admitting: Cardiology

## 2023-06-07 VITALS — BP 146/80 | Ht 67.0 in | Wt 242.1 lb

## 2023-06-07 DIAGNOSIS — E669 Obesity, unspecified: Secondary | ICD-10-CM | POA: Diagnosis not present

## 2023-06-07 DIAGNOSIS — E785 Hyperlipidemia, unspecified: Secondary | ICD-10-CM | POA: Diagnosis not present

## 2023-06-07 DIAGNOSIS — R0609 Other forms of dyspnea: Secondary | ICD-10-CM | POA: Insufficient documentation

## 2023-06-07 DIAGNOSIS — R030 Elevated blood-pressure reading, without diagnosis of hypertension: Secondary | ICD-10-CM | POA: Insufficient documentation

## 2023-06-07 NOTE — Progress Notes (Signed)
 Cardiology Office Note:    Date:  06/07/2023   ID:  Lisa Ellis, DOB [birth year], MRN [id number]  PCP:  Esperanza Richters, PA-C  Cardiologist:  Garwin Brothers, MD   Referring MD: Esperanza Richters, PA-C    ASSESSMENT:    1. Dyslipidemia   2. DOE (dyspnea on exertion)   3. Elevated blood pressure reading in office with white coat syndrome, without diagnosis of hypertension   4. Obesity (BMI 35.0-39.9 without comorbidity)    PLAN:    In order of problems listed above:  Primary prevention stressed with the patient.  Importance of compliance with diet medication stressed and patient verbalized standing. Dyspnea on exertion: We will do a exercise stress echo (send if symptoms Elevated blood pressure: She has an element of whitecoat hypertension.  She will keep a track of her blood pressures at home. Mixed dyslipidemia fasting lipid check will be obtained. Obesity: Diet was emphasized.  Lifestyle modification urged and she promises to do that.  Risks of obesity explained. Coronary risk stratification: CT calcium score was recommended and she is agreeable.  Not sure why she is on a coated baby aspirin and I told her to quit that. Patient will be seen in follow-up appointment in 12 months or earlier if the patient has any concerns.    Medication Adjustments/Labs and Tests Ordered: Current medicines are reviewed at length with the patient today.  Concerns regarding medicines are outlined above.  Orders Placed This Encounter  Procedures   EKG 12-Lead   No orders of the defined types were placed in this encounter.    History of Present Illness:    Lisa Ellis is a 48 y.o. female who is being seen today for the evaluation of dyspnea on exertion at the request of [place], Ramon Dredge, [place].  Patient is a pleasant 48 year old.  She has past medical history elevated.  She mentions to her blood pressure fluctuates but overall she mentions to be active to be fine.  She does not hypertension she  is overweight and leads a sedentary lifestyle.  She has some dyspnea on exertion.  At the time of my evaluation, the patient is alert awake oriented and in no distress.  Past Medical History:  Diagnosis Date   Allergic rhinitis 11/12/2014   Allergy    Asthma    Only wheezed on time associated with pneumonia. none since. temporary only   Atypical chest pain 11/12/2014   Costochondritis 11/12/2014   COVID    September 2021   Depression    only had brief episode in 2006 for couple of months when dad passed away.   Dyslipidemia 17-Jun-2017   Upper back pain on left side 11/12/2014   Wellness examination 12/22/2014    Past Surgical History:  Procedure Laterality Date   AUGMENTATION MAMMAPLASTY Bilateral 2013   Saline   CHOLECYSTECTOMY     NASAL SINUS SURGERY      Current Medications: Current Meds  Medication Sig   aspirin EC 81 MG tablet Take 1 tablet (81 mg total) by mouth daily.   Current Facility-Administered Medications for the 06/07/23 encounter (Office Visit) with Lisa Ellis, Aundra Dubin, MD  Medication   0.9 %  sodium chloride infusion     Allergies:   Medroxyprogesterone   Social History   Socioeconomic History   Marital status: Married    Spouse name: Not on file   Number of children: Not on file   Years of education: Not on file   Highest education level: 12th grade  Occupational History   Not on file  Tobacco Use   Smoking status: Never   Smokeless tobacco: Never  Vaping Use   Vaping status: Never Used  Substance and Sexual Activity   Alcohol use: No    Alcohol/week: 0.0 standard drinks of alcohol   Drug use: No   Sexual activity: Yes  Other Topics Concern   Not on file  Social History Narrative   Not on file   Social Drivers of Health   Financial Resource Strain: Low Risk  (04/21/2023)   Overall Financial Resource Strain (CARDIA)    Difficulty of Paying Living Expenses: Not hard at all  Food Insecurity: No Food Insecurity (04/21/2023)   Hunger Vital  Sign    Worried About Running Out of Food in the Last Year: Never true    Ran Out of Food in the Last Year: Never true  Transportation Needs: No Transportation Needs (04/21/2023)   PRAPARE - Administrator, Civil Service (Medical): No    Lack of Transportation (Non-Medical): No  Physical Activity: Unknown (04/21/2023)   Exercise Vital Sign    Days of Exercise per Week: 0 days    Minutes of Exercise per Session: Not on file  Stress: Stress Concern Present (04/21/2023)   [employer] of Occupational Health - Occupational Stress Questionnaire    Feeling of Stress : To some extent  Social Connections: Moderately Isolated (04/21/2023)   Social Connection and Isolation Panel [NHANES]    Frequency of Communication with Friends and Family: More than three times a week    Frequency of Social Gatherings with Friends and Family: Once a week    Attends Religious Services: Never    Database administrator or Organizations: No    Attends Engineer, structural: Not on file    Marital Status: Married     Family History: The patient's family history includes Diabetes in her father and paternal grandmother; Hypertension in her maternal grandfather and mother. There is no history of Colon cancer, Esophageal cancer, Rectal cancer, or Stomach cancer.  ROS:   Please see the history of present illness.    All other systems reviewed and are negative.  EKGs/Labs/Other Studies Reviewed:    The following studies were reviewed today:  EKG Interpretation Date/Time:  Thursday June 07 2023 15:57:53 EDT Ventricular Rate:  85 PR Interval:  160 QRS Duration:  82 QT Interval:  362 QTC Calculation: 430 R Axis:   98  Text Interpretation: Normal sinus rhythm Rightward axis Low voltage QRS When compared with ECG of 05-Dec-2019 11:27, PREVIOUS ECG IS PRESENT Confirmed by Belva Crome [phone]) on 06/07/2023 4:10:21 PM     Recent Labs: 04/23/2023: ALT 26; BUN 15; Creatinine, Ser 0.81;  Hemoglobin 14.1; Platelets 335.0; Potassium 4.2; Sodium 140; TSH 1.18  Recent Lipid Panel    Component Value Date/Time   CHOL 188 11/18/2021 1012   TRIG 133.0 11/18/2021 1012   HDL 45.60 11/18/2021 1012   CHOLHDL 4 11/18/2021 1012   VLDL 26.6 11/18/2021 1012   LDLCALC 116 (H) 11/18/2021 1012    Physical Exam:    VS:  BP (!) 146/80   Ht 5\' 7"  (1.702 m)   Wt 242 lb 1.9 oz (109.8 kg)   SpO2 97%   BMI 37.92 kg/m     Wt Readings from Last 3 Encounters:  06/07/23 242 lb 1.9 oz (109.8 kg)  04/23/23 241 lb (109.3 kg)  01/12/23 238 lb (108 kg)     GEN: Patient  is in no acute distress HEENT: Normal NECK: No JVD; No carotid bruits LYMPHATICS: No lymphadenopathy CARDIAC: S1 S2 regular, 2/6 systolic murmur at the apex. RESPIRATORY:  Clear to auscultation without rales, wheezing or rhonchi  ABDOMEN: Soft, non-tender, non-distended MUSCULOSKELETAL:  No edema; No deformity  SKIN: Warm and dry NEUROLOGIC:  Alert and oriented x 3 PSYCHIATRIC:  Normal affect    Signed, Garwin Brothers, MD  06/07/2023 4:21 PM    [facility]

## 2023-06-07 NOTE — Patient Instructions (Signed)
 Medication Instructions:  Your physician recommends that you continue on your current medications as directed. Please refer to the Current Medication list given to you today.  *If you need a refill on your cardiac medications before your next appointment, please call your pharmacy*   Lab Work: Your physician recommends that you return for lab work in: the next few days for LFT and lipids. You need to have labs done when you are fasting. [facility] is located on the 3rd floor, [address]. Hours are Monday - Friday 8 am to 4 pm, closed 11:30 am to 1:00 pm. You do NOT need an appointment.   If you have labs (blood work) drawn today and your tests are completely normal, you will receive your results only by: MyChart Message (if you have MyChart) OR A paper copy in the mail If you have any lab test that is abnormal or we need to change your treatment, we will call you to review the results.   Testing/Procedures:  Stress Echocardiogram Instructions:    1. You may take all of your medications.  2. No food, drink or tobacco products four hours prior to your test.  3. Dress prepared to exercise. Best to wear 2 piece outfit and tennis shoes. Shoes must be closed toe.  4. Please bring all current prescription medications.  We will order CT coronary calcium score. It will cost $99.00 and is due at time of scan.  Please call to schedule.    [facility] [address] [place], [place] [address] [phone]   Follow-Up: At [facility], you and your health needs are our priority.  As part of our continuing mission to provide you with exceptional heart care, we have created designated Provider Care Teams.  These Care Teams include your primary Cardiologist (physician) and Advanced Practice Providers (APPs -  Physician Assistants and Nurse Practitioners) who all work together to provide you with the care you need, when you need it.  We recommend signing up for the  patient portal called "MyChart".  Sign up information is provided on this After Visit Summary.  MyChart is used to connect with patients for Virtual Visits (Telemedicine).  Patients are able to view lab/test results, encounter notes, upcoming appointments, etc.  Non-urgent messages can be sent to your provider as well.   To learn more about what you can do with MyChart, go to [email or web address].    Your next appointment:   12 month(s)  The format for your next appointment:   In Person  Provider:   Belva Crome, MD   Other Instructions Exercise Stress Echocardiogram An exercise stress echocardiogram is a test to check how well your heart is working. This test uses sound waves and a computer to make pictures of your heart. These pictures will be taken before and after you exercise. For this test, you will walk on a treadmill or ride a bicycle to make your heart beat faster. While you exercise, your heart will be checked with an electrocardiogram (ECG). Your blood pressure will also be checked. You may have this test if: You have chest pain or a heart problem. You had a heart attack or heart surgery not long ago. You have heart valve problems. You have a condition that causes narrowing of the blood vessels that supply your heart. You have a high risk of heart disease and: You are starting a new exercise program. You need to have a big surgery. Tell a doctor about:  Any allergies you have. All medicines you are taking. This includes vitamins, herbs, eye drops, creams, and over-the-counter medicines. Any problems you or family members have had with medicines that make you fall asleep (anesthetic medicines). Any surgeries you have had. Any blood disorders you have. Any medical conditions you have. Whether you are pregnant or may be pregnant. What are the risks? Generally, this is a safe test. However, problems may occur, including: Chest pain. Feeling dizzy or  light-headed. Shortness of breath. Increased or irregular heartbeat. Feeling like you may vomit (nausea) or vomiting. Heart attack. This is very rare. What happens before the test? Medicines Ask your doctor about changing or stopping your normal medicines. This is important if you take diabetes medicines or blood thinners. If you use an inhaler, bring it to the test. General instructions Wear comfortable clothes and walking shoes. Follow instructions from your doctor about what you cannot eat or drink before the test. Do not drink or eat anything that has caffeine in it. Stop having caffeine 24 hours before the test. Do not smoke or use products that contain nicotine or tobacco for 4 hours before the test. If you need help quitting, ask your doctor. What happens during the test?  You will take off your clothes from the waist up and put on a hospital gown. Electrodes or patches will be put on your chest. A blood pressure cuff will be put on your arm. Before you exercise, a computer will make a picture of your heart. To do this: You will lie down and a gel will be put on your chest. A wand will be moved over the gel. Sound waves from the wand will go to the computer to make the picture. Then, you will start to exercise. You may walk on a treadmill or pedal a bicycle. Your blood pressure and heart rhythm will be checked while you exercise. The exercise will get harder or faster. You will exercise until: Your heart reaches a certain level. You are too tired to go on. You cannot go on because of chest pain, weakness, or dizziness. You will lie down right away so another picture of your heart can be taken. The procedure may vary among doctors and hospitals. What can I expect after the test? After your test, it is common to have: Mild soreness. Mild tiredness. Your heart rate and blood pressure will be checked until they return to your normal levels. You should not have any new symptoms  after this test. Follow these instructions at home: If your doctor says that you can, you may: Eat what you normally eat. Do your normal activities. Take over-the-counter and prescription medicines only as told by your doctor. Keep all follow-up visits. It is up to you to get the results of your test. Ask how to get your results when they are ready. Contact a doctor if: You feel dizzy or light-headed. You have a fast or irregular heartbeat. You feel like you may vomit or you vomit. You have a headache. You feel short of breath. Get help right away if: You develop pain or pressure: In your chest. In your jaw or neck. Between your shoulders. That goes down your left arm. You faint. You have trouble breathing. These symptoms may be an emergency. Get medical help right away. Call your local emergency services (911 in the [place] Do not wait to see if the symptoms will go away. Do not drive yourself to the hospital. Summary This is a test that  checks how well your heart is working. Follow instructions about what you cannot eat or drink before the test. Ask your doctor if you should take your normal medicines before the test. Stop having caffeine 24 hours before the test. Do not smoke or use products with nicotine or tobacco in them for 4 hours before the test. During the test, your blood pressure and heart rhythm will be checked while you exercise. This information is not intended to replace advice given to you by your health care provider. Make sure you discuss any questions you have with your health care provider. Document Revised: 11/10/2020 Document Reviewed: 10/21/2019 Elsevier Patient Education  2022 Elsevier Inc.  Coronary Calcium Scan A coronary calcium scan is an imaging test used to look for deposits of plaque in the inner lining of the blood vessels of the heart (coronary arteries). Plaque is made up of calcium, protein, and fatty substances. These deposits of plaque can  partly clog and narrow the coronary arteries without producing any symptoms or warning signs. This puts a person at risk for a heart attack. A coronary calcium scan is performed using a computed tomography (CT) scanner machine without using a dye (contrast). This test is recommended for people who are at moderate risk for heart disease. The test can find plaque deposits before symptoms develop. Tell a health care provider about: Any allergies you have. All medicines you are taking, including vitamins, herbs, eye drops, creams, and over-the-counter medicines. Any problems you or family members have had with anesthetic medicines. Any bleeding problems you have. Any surgeries you have had. Any medical conditions you have. Whether you are pregnant or may be pregnant. What are the risks? Generally, this is a safe procedure. However, problems may occur, including: Harm to a pregnant woman and her unborn baby. This test involves the use of radiation. Radiation exposure can be dangerous to a pregnant woman and her unborn baby. If you are pregnant or think you may be pregnant, you should not have this procedure done. A slight increase in the risk of cancer. This is because of the radiation involved in the test. The amount of radiation from one test is similar to the amount of radiation you are naturally exposed to over one year. What happens before the procedure? Ask your health care provider for any specific instructions on how to prepare for this procedure. You may be asked to avoid products that contain caffeine, tobacco, or nicotine for 4 hours before the procedure. What happens during the procedure?  You will undress and remove any jewelry from your neck or chest. You may need to remove hearing aides and dentures. Women may need to remove their bras. You will put on a hospital gown. Sticky electrodes will be placed on your chest. The electrodes will be connected to an electrocardiogram (ECG) machine  to record a tracing of the electrical activity of your heart. You will lie down on your back on a curved bed that is attached to the CT scanner. You may be given medicine to slow down your heart rate so that clear pictures can be created. You will be moved into the CT scanner, and the CT scanner will take pictures of your heart. During this time, you will be asked to lie still and hold your breath for 10-20 seconds at a time while each picture of your heart is being taken. The procedure may vary among health care providers and hospitals. What can I expect after the procedure? You can  return to your normal activities. It is up to you to get the results of your procedure. Ask your health care provider, or the department that is doing the procedure, when your results will be ready. Summary A coronary calcium scan is an imaging test used to look for deposits of plaque in the inner lining of the blood vessels of the heart. Plaque is made up of calcium, protein, and fatty substances. A coronary calcium scan is performed using a CT scanner machine without contrast. Generally, this is a safe procedure. Tell your health care provider if you are pregnant or may be pregnant. Ask your health care provider for any specific instructions on how to prepare for this procedure. You can return to your normal activities after the scan is done. This information is not intended to replace advice given to you by your health care provider. Make sure you discuss any questions you have with your health care provider. Document Revised: 02/06/2021 Document Reviewed: 02/06/2021 Elsevier Patient Education  2024 [employer].

## 2023-06-13 ENCOUNTER — Ambulatory Visit (HOSPITAL_BASED_OUTPATIENT_CLINIC_OR_DEPARTMENT_OTHER)
Admission: RE | Admit: 2023-06-13 | Discharge: 2023-06-13 | Disposition: A | Discharge status: Home / Self Care | Payer: Self-pay | Source: Ambulatory Visit | Attending: Cardiology | Admitting: Cardiology

## 2023-06-13 DIAGNOSIS — E785 Hyperlipidemia, unspecified: Secondary | ICD-10-CM | POA: Diagnosis not present

## 2023-06-14 LAB — HEPATIC FUNCTION PANEL
ALT: 30 IU/L (ref 0–32)
AST: 27 IU/L (ref 0–40)
Albumin: 4.4 g/dL (ref 3.9–4.9)
Alkaline Phosphatase: 96 IU/L (ref 44–121)
Bilirubin Total: 0.4 mg/dL (ref 0.0–1.2)
Bilirubin, Direct: 0.12 mg/dL (ref 0.00–0.40)
Total Protein: 7.1 g/dL (ref 6.0–8.5)

## 2023-06-14 LAB — LIPID PANEL
Chol/HDL Ratio: 5.1 ratio — ABNORMAL HIGH (ref 0.0–4.4)
Cholesterol, Total: 204 mg/dL — ABNORMAL HIGH (ref 100–199)
HDL: 40 mg/dL (ref >39)
LDL Chol Calc (NIH): 125 mg/dL — ABNORMAL HIGH (ref 0–99)
Triglycerides: 219 mg/dL — ABNORMAL HIGH (ref 0–149)
VLDL Cholesterol Cal: 39 mg/dL (ref 5–40)

## 2023-06-15 ENCOUNTER — Telehealth: Payer: Self-pay

## 2023-06-15 DIAGNOSIS — E785 Hyperlipidemia, unspecified: Secondary | ICD-10-CM

## 2023-06-15 NOTE — Telephone Encounter (Signed)
 MyChart message

## 2023-06-15 NOTE — Telephone Encounter (Signed)
-----   Message from Aundra Dubin Revankar sent at 06/14/2023  9:43 PM EDT ----- Mildly elevated lipids.  Diet.  Liver lipid check in 6 weeks.  Copy primary Garwin Brothers, MD 06/14/2023 9:43 PM

## 2023-06-26 ENCOUNTER — Encounter (HOSPITAL_COMMUNITY): Payer: Self-pay

## 2023-07-03 ENCOUNTER — Ambulatory Visit

## 2023-07-05 ENCOUNTER — Other Ambulatory Visit (HOSPITAL_COMMUNITY)

## 2023-07-13 ENCOUNTER — Encounter: Payer: Self-pay | Admitting: Cardiology

## 2023-07-18 DIAGNOSIS — G5761 Lesion of plantar nerve, right lower limb: Secondary | ICD-10-CM | POA: Diagnosis not present

## 2023-07-18 DIAGNOSIS — M65871 Other synovitis and tenosynovitis, right ankle and foot: Secondary | ICD-10-CM | POA: Diagnosis not present

## 2023-07-18 DIAGNOSIS — M79671 Pain in right foot: Secondary | ICD-10-CM | POA: Diagnosis not present

## 2023-07-18 DIAGNOSIS — M2041 Other hammer toe(s) (acquired), right foot: Secondary | ICD-10-CM | POA: Diagnosis not present

## 2023-08-23 ENCOUNTER — Other Ambulatory Visit: Payer: Self-pay | Admitting: Medical

## 2023-10-09 DIAGNOSIS — N938 Other specified abnormal uterine and vaginal bleeding: Secondary | ICD-10-CM | POA: Diagnosis not present

## 2023-10-09 DIAGNOSIS — N921 Excessive and frequent menstruation with irregular cycle: Secondary | ICD-10-CM | POA: Diagnosis not present

## 2023-12-03 ENCOUNTER — Telehealth: Admitting: Physician Assistant

## 2023-12-03 ENCOUNTER — Other Ambulatory Visit: Payer: Self-pay | Admitting: Physician Assistant

## 2023-12-03 DIAGNOSIS — F4320 Adjustment disorder, unspecified: Secondary | ICD-10-CM

## 2023-12-03 NOTE — Patient Instructions (Signed)
  [name], thank you for joining [name], PA-C for today's virtual visit.  While this provider is not your primary care provider (PCP), if your PCP is located in our provider database this encounter information will be shared with them immediately following your visit.   A [facility] MyChart account gives you access to today's visit and all your visits, tests, and labs performed at [facility]  click here if you don't have a [facility] MyChart account or go to [email or web address]  Consent: (Patient) Lisa Ellis provided verbal consent for this virtual visit at the beginning of the encounter.  Current Medications:  Current Outpatient Medications:    SEMAGLUTIDE , , Disp: , Rfl:   Current Facility-Administered Medications:    0.9 %  sodium chloride  infusion, , Intravenous, Once, Christopher Savannah, PA-C   Medications ordered in this encounter:  No orders of the defined types were placed in this encounter.    *If you need refills on other medications prior to your next appointment, please contact your pharmacy*  Follow-Up: Call back or seek an in-person evaluation if the symptoms worsen or if the condition fails to improve as anticipated.  [facility] [phone]  Other Instructions   If you have been instructed to have an in-person evaluation today at a local Urgent Care facility, please use the link below. It will take you to a list of all of our available [facility], including address, phone number and hours of operation. Please do not delay care.  [facility]  If you or a family member do not have a primary care provider, use the link below to schedule a visit and establish care. When you choose a [facility] primary care physician or advanced practice provider, you gain a long-term partner in health. Find a Primary Care Provider  Learn more about [facility]'s in-office and virtual care options: [facility] - Get Care Now

## 2023-12-03 NOTE — Progress Notes (Signed)
 Virtual Visit Consent   Lisa Ellis, you are scheduled for a virtual visit with a [facility] provider today. Just as with appointments in the office, your consent must be obtained to participate. Your consent will be active for this visit and any virtual visit you may have with one of our providers in the next 365 days. If you have a MyChart account, a copy of this consent can be sent to you electronically.  As this is a virtual visit, video technology does not allow for your provider to perform a traditional examination. This may limit your provider's ability to fully assess your condition. If your provider identifies any concerns that need to be evaluated in person or the need to arrange testing (such as labs, EKG, etc.), we will make arrangements to do so. Although advances in technology are sophisticated, we cannot ensure that it will always work on either your end or our end. If the connection with a video visit is poor, the visit may have to be switched to a telephone visit. With either a video or telephone visit, we are not always able to ensure that we have a secure connection.  By engaging in this virtual visit, you consent to the provision of healthcare and authorize for your insurance to be billed (if applicable) for the services provided during this visit. Depending on your insurance coverage, you may receive a charge related to this service.  I need to obtain your verbal consent now. Are you willing to proceed with your visit today? Lisa Ellis has provided verbal consent on 12/03/2023 for a virtual visit (video or telephone). Lisa Ellis, [place]  Date: 12/03/2023 4:41 PM   Virtual Visit via Video Note   I, Lisa Ellis, connected with  Lisa Ellis  ([id number], [birth year]) on 12/03/23 at  4:30 PM EDT by a video-enabled telemedicine application and verified that I am speaking with the correct person using two identifiers.  Location: Patient: Virtual Visit Location  Patient: Home Provider: Virtual Visit Location Provider: Home Office   I discussed the limitations of evaluation and management by telemedicine and the availability of in person appointments. The patient expressed understanding and agreed to proceed.    History of Present Illness: Lisa Ellis is a 48 y.o. who identifies as a female who was assigned female at birth, and is being seen today for possibility of a one-time Rx to help her get through funeral services over the next 3 days for a close friend that she suddenly and unexpectedly lost. Notes having a very hard time with this loss, feeling very anxious about the services. Is trying to keep it together. Has remote history of depression with no current symptoms. Is not on medication for this. Notes she previously had to take Xanax  when her father passed away.   HPI: HPI  Problems:  Patient Active Problem List   Diagnosis Date Noted   DOE (dyspnea on exertion) 06/07/2023   Elevated blood pressure reading in office with white coat syndrome, without diagnosis of hypertension 06/07/2023   Obesity (BMI 35.0-39.9 without comorbidity) 06/07/2023   Allergy    Asthma    COVID    Depression    Dyslipidemia 06/15/2017   Wellness examination 12/22/2014   Atypical chest pain 11/12/2014   Upper back pain on left side 11/12/2014   Costochondritis 11/12/2014   Allergic rhinitis 11/12/2014    Allergies:  Allergies  Allergen Reactions   Medroxyprogesterone     Weight gain  Bloating Vision changes Fatigue  Mood swings   Medications:  Current Outpatient Medications:    SEMAGLUTIDE , , Disp: , Rfl:   Current Facility-Administered Medications:    0.9 %  sodium chloride  infusion, , Intravenous, Once, Christopher Savannah, PA-C  Observations/Objective: Patient is well-developed, well-nourished in no acute distress.  Resting comfortably at home.  Head is normocephalic, atraumatic.  No labored breathing. Speech is clear and coherent with logical  content.  Patient is alert and oriented at baseline.   Assessment and Plan: 1. Grief reaction (Primary)  Discussed unfortunately we cannot prescribe controlled medications via a VUC visit. 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 Will attempt trial of 50 mg Hydroxyzine  (has script at home of 25 mg tablet -- taking 2 as directed). Will also message PCP to see about willingness to give Rx for couple of tablets of Xanax  if needed.  Follow Up Instructions: I discussed the assessment and treatment plan with the patient. The patient was provided an opportunity to ask questions and all were answered. The patient agreed with the plan and demonstrated an understanding of the instructions.  A copy of instructions were sent to the patient via MyChart unless otherwise noted below.   The patient was advised to call back or seek an in-person evaluation if the symptoms worsen or if the condition fails to improve as anticipated.    Lisa Velma Lunger, PA-C

## 2023-12-04 ENCOUNTER — Encounter: Payer: Self-pay | Admitting: Medical

## 2023-12-04 MED ORDER — ALPRAZOLAM 0.5 MG PO TABS: ORAL_TABLET | ORAL | 0 refills | Status: DC

## 2023-12-04 NOTE — Addendum Note (Signed)
 Addended by: DORINA DALLAS HERO on: 12/04/2023 12:40 PM   Modules accepted: Orders

## 2024-01-30 ENCOUNTER — Ambulatory Visit: Payer: Self-pay

## 2024-01-30 NOTE — Telephone Encounter (Signed)
 FYI Only or Action Required?: FYI only for provider: appointment scheduled on 01/31/24.  Patient was last seen in primary care on 12/03/2023 by Gladis Elsie BROCKS, PA-C.  Called Nurse Triage reporting Wheezing.  Symptoms began several months ago.  Interventions attempted: Rest, hydration, or home remedies.  Symptoms are: unchanged.  Triage Disposition: See PCP Within 2 Weeks  Patient/caregiver understands and will follow disposition?: Yes   Copied from CRM #[id number]. Topic: Clinical - Red Word Triage >> Jan 30, 2024  4:36 PM Dedra B wrote: Red Word that prompted transfer to Nurse Triage: Pt experiencing wheezing that gets worse at night. She said it has been going on for 2 months, but is more bothersome today. Warm transfer to NT. Reason for Disposition  [1] MILD longstanding difficulty breathing (e.g., minimal/no SOB at rest, SOB with walking, pulse < 100) AND [2] SAME as normal  Answer Assessment - Initial Assessment Questions Additional info: Patient called in to schedule appointment to evaluated intermittent wheezing for two months. Not increasing in frequency but bothering her and would like evaluation and treatment. Scheduled 01/31/24.  Requesting TOC to office closer to her home.-Scheduled.    1. RESPIRATORY STATUS: Describe your breathing? (e.g., wheezing, shortness of breath, unable to speak, severe coughing)      Wheezing  2. ONSET: When did this breathing problem begin?      Several months 3. PATTERN Does the difficult breathing come and go, or has it been constant since it started?      intermittent 4. SEVERITY: How bad is your breathing? (e.g., mild, moderate, severe)      Mild wheezing intermittently 5. RECURRENT SYMPTOM: Have you had difficulty breathing before? If Yes, ask: When was the last time? and What happened that time?      Yes, bronchiolitis awhile back treated with steroids, does not have an inhaler 6. CARDIAC HISTORY: Do you have any  history of heart disease? (e.g., heart attack, angina, bypass surgery, angioplasty)       7. LUNG HISTORY: Do you have any history of lung disease?  (e.g., pulmonary embolus, asthma, emphysema)     Bronchiolitis  8. CAUSE: What do you think is causing the breathing problem?      Unsure  9. OTHER SYMPTOMS: Do you have any other symptoms? (e.g., chest pain, cough, dizziness, fever, runny nose)     Denies all other symptoms  10. O2 SATURATION MONITOR:  Do you use an oxygen saturation monitor (pulse oximeter) at home? If Yes, ask: What is your reading (oxygen level) today? What is your usual oxygen saturation reading? (e.g., 95%)       no 11. PREGNANCY: Is there any chance you are pregnant? When was your last menstrual period?        12. TRAVEL: Have you traveled out of the country in the last month? (e.g., travel history, exposures)  Protocols used: Breathing Difficulty-A-AH

## 2024-01-31 ENCOUNTER — Ambulatory Visit: Payer: Self-pay | Admitting: Medical

## 2024-01-31 ENCOUNTER — Ambulatory Visit: Admitting: Medical

## 2024-01-31 VITALS — BP 128/98 | HR 79 | Temp 98.2°F | Resp 17 | Ht 67.0 in | Wt 245.2 lb

## 2024-01-31 DIAGNOSIS — E669 Obesity, unspecified: Secondary | ICD-10-CM | POA: Diagnosis not present

## 2024-01-31 DIAGNOSIS — R062 Wheezing: Secondary | ICD-10-CM

## 2024-01-31 DIAGNOSIS — K219 Gastro-esophageal reflux disease without esophagitis: Secondary | ICD-10-CM

## 2024-01-31 DIAGNOSIS — J309 Allergic rhinitis, unspecified: Secondary | ICD-10-CM | POA: Diagnosis not present

## 2024-01-31 DIAGNOSIS — R739 Hyperglycemia, unspecified: Secondary | ICD-10-CM

## 2024-01-31 LAB — HEMOGLOBIN A1C: Hgb A1c MFr Bld: 5.8 % (ref 4.6–6.5)

## 2024-01-31 MED ORDER — BUDESONIDE-FORMOTEROL FUMARATE 80-4.5 MCG/ACT IN AERO: 2.0000 | INHALATION_SPRAY | Freq: Two times a day (BID) | RESPIRATORY_TRACT | 3 refills | Status: AC

## 2024-01-31 MED ORDER — MONTELUKAST SODIUM 10 MG PO TABS: 10.0000 mg | ORAL_TABLET | Freq: Every day | ORAL | 3 refills | Status: AC

## 2024-01-31 MED ORDER — WEGOVY 0.25 MG/0.5ML ~~LOC~~ SOAJ: SUBCUTANEOUS | 0 refills | Status: DC

## 2024-01-31 NOTE — Addendum Note (Signed)
 Addended by: DORINA DALLAS HERO on: 01/31/2024 05:47 PM   Modules accepted: Orders

## 2024-01-31 NOTE — Progress Notes (Signed)
   Subjective:    Patient ID: Lisa Ellis, female    DOB: 1976/01/15, 48 y.o.   MRN: [id number]  HPI Lisa Ellis is a 48 year old female who presents with persistent wheezing and throat clearing for the past two months.  Wheezing is described as a whistling sound, more pronounced at night, especially when lying down, and disrupts sleep. No cough or nasal congestion is present. A similar episode occurred in the spring of the previous year, resolved with a tapered dose of prednisone . She has year-round allergies but is not on preventive or treatment medication. She denies smoking and has a remembers episode of wheezing during pneumonia over fifteen years ago. Mucinex and cold and flu medications have not provided relief. Albuterol  inhalers were prescribed in September 2023 for mild nocturnal wheezing.  Occasional swelling and pain in the right foot, particularly around the big toe, following a fall three months ago, with an upcoming podiatrist appointment.  No heartburn but experiences a burning sensation in her throat and dry mouth at night, with a sour taste in her mouth at times. Seasonal allergies are present but without current symptoms.       Review of Systems  See hpi       Objective:   Physical Exam  General Mental Status- Alert. General Appearance- Not in acute distress.   Skin General: Color- Normal Color. Moisture- Normal Moisture.  Neck  No JVD.  Chest and Lung Exam Auscultation: Breath Sounds: CTA  Cardiovascular Auscultation:Rythm- RRR Murmurs & Other Heart Sounds:Auscultation of the heart reveals- No Murmurs.  Abdomen Inspection:-Inspeection Normal. Palpation/Percussion:Note:No mass. Palpation and Percussion of the abdomen reveal- Non Tender, Non Distended + BS, no rebound or guarding.    Neurologic Cranial Nerve exam:- CN III-XII intact(No nystagmus), symmetric smile. IStrength:- 5/5 equal and symmetric strength both upper and lower extremities.    Lower ext- calfs symmetric, negative homans signs, no pedal edema and no tenderness bilaterally.    Assessment & Plan:   Wheezing with possible contribution from allergic rhinitis and possible gastroesophageal reflux disease Intermittent nocturnal wheezing for two months. Possible contributions from allergic rhinitis and GERD. Differential includes asthma and reflux-related wheezing. Previous improvement with prednisone . No current allergy symptoms or smoking history. Possible reflux-related hoarseness. - Prescribed Symbicort inhaler, two inhalations twice daily. - Recommended over-the-counter famotidine  once daily for two weeks. - Prescribed montelukast , one tablet at night. - If wheezing persists, will consider Medrol  dose pack. - If wheezing recurs in future, will consider referral to pulmonologist for pulmonary function testing.  Obesity and hyperglycemia evaluation for anti-obesity pharmacotherapy Obesity with previous trial of semaglutide . Perimenopausal status may contribute to weight gain. Previous A1c was 6.4, indicating borderline diabetes. Discussion of insurance coverage for Ozempic  versus Wegovy  based on A1c results. No history of pancreatitis or thyroid  cancer. - Ordered A1c test today. - If A1c is 6.5 or higher, will prescribe Ozempic . - If A1c is less than 6.5, will prescribe Wegovy .  Follow up date to be determined after a1c and depending on how you do with tx for wheezing   Ithiel Liebler, PA-C

## 2024-01-31 NOTE — Patient Instructions (Signed)
 Wheezing with possible contribution from allergic rhinitis and possible gastroesophageal reflux disease Intermittent nocturnal wheezing for two months. Possible contributions from allergic rhinitis and GERD. Differential includes asthma and reflux-related wheezing. Previous improvement with prednisone . No current allergy symptoms or smoking history. Possible reflux-related hoarseness. - Prescribed Symbicort inhaler, two inhalations twice daily. - Recommended over-the-counter famotidine  once daily for two weeks. - Prescribed montelukast , one tablet at night. - If wheezing persists, will consider Medrol  dose pack. - If wheezing recurs in future, will consider referral to pulmonologist for pulmonary function testing.  Obesity and hyperglycemia evaluation for anti-obesity pharmacotherapy Obesity with previous trial of semaglutide . Perimenopausal status may contribute to weight gain. Previous A1c was 6.4, indicating borderline diabetes. Discussion of insurance coverage for Ozempic  versus Wegovy  based on A1c results. No history of pancreatitis or thyroid  cancer. - Ordered A1c test today. - If A1c is 6.5 or higher, will prescribe Ozempic . - If A1c is less than 6.5, will prescribe Wegovy .  Follow up date to be determined after a1c and depending on how you do with tx for wheezing

## 2024-02-01 ENCOUNTER — Other Ambulatory Visit: Payer: Self-pay | Admitting: Medical

## 2024-02-04 DIAGNOSIS — M79671 Pain in right foot: Secondary | ICD-10-CM | POA: Diagnosis not present

## 2024-02-04 DIAGNOSIS — S9031XA Contusion of right foot, initial encounter: Secondary | ICD-10-CM | POA: Diagnosis not present

## 2024-02-04 DIAGNOSIS — M84377A Stress fracture, right toe(s), initial encounter for fracture: Secondary | ICD-10-CM | POA: Diagnosis not present

## 2024-02-04 MED ORDER — WEGOVY 0.25 MG/0.5ML ~~LOC~~ SOAJ: SUBCUTANEOUS | 0 refills | Status: DC

## 2024-02-05 ENCOUNTER — Other Ambulatory Visit (HOSPITAL_COMMUNITY): Payer: Self-pay

## 2024-02-05 ENCOUNTER — Encounter: Payer: Self-pay | Admitting: Medical

## 2024-02-05 ENCOUNTER — Telehealth: Payer: Self-pay

## 2024-02-05 ENCOUNTER — Other Ambulatory Visit: Payer: Self-pay | Admitting: Medical

## 2024-02-05 NOTE — Telephone Encounter (Signed)
Pt informed

## 2024-02-05 NOTE — Telephone Encounter (Signed)
 Pharmacy Patient Advocate Encounter   Received notification from RX Request Messages that prior authorization for Wegovy  0.25mg /0.63ml is required/requested.   Insurance verification completed.   The patient is insured through [facility].   Per test claim: Per test claim, medication is not covered due to plan/benefit exclusion, PA not submitted at this time

## 2024-02-28 ENCOUNTER — Telehealth: Payer: Self-pay

## 2024-02-28 NOTE — Telephone Encounter (Signed)
 Called pharmacy they said it was on hold due to needing a PA notified them that it had already went through the process and was denied. I notified them the pt is willing to pay full price. They agreed that that was fine and they would get it ready to be filled for the pt. I then called mrs.Severt and notified her of the call with the pharmacy

## 2024-02-28 NOTE — Telephone Encounter (Signed)
 error

## 2024-03-11 ENCOUNTER — Encounter: Admitting: Internal Medicine

## 2024-03-11 NOTE — Progress Notes (Deleted)
 " [facility] [address] [place] [address] Dept: [phone] Dept Fax: [phone]  New Patient Office Visit  Subjective:   Lisa Ellis 1975/12/12 03/11/2024  No chief complaint on file.   HPI: Lisa Ellis presents today to TRANSFER care at [facility]. Introduced to publishing rights manager role and practice setting.  All questions answered.  Concerns: See below    The following portions of the patient's history were reviewed and updated as appropriate: past medical history, past surgical history, family history, social history, allergies, medications, and problem list.   Patient Active Problem List   Diagnosis Date Noted   DOE (dyspnea on exertion) 06/07/2023   Elevated blood pressure reading in office with white coat syndrome, without diagnosis of hypertension 06/07/2023   Obesity (BMI 35.0-39.9 without comorbidity) 06/07/2023   Allergy    Asthma    COVID    Depression    Dyslipidemia 2017-07-12   Wellness examination 12/22/2014   Atypical chest pain 11/12/2014   Upper back pain on left side 11/12/2014   Costochondritis 11/12/2014   Allergic rhinitis 11/12/2014   Past Medical History:  Diagnosis Date   Allergic rhinitis 11/12/2014   Allergy    Asthma    Only wheezed on time associated with pneumonia. none since. temporary only   Atypical chest pain 11/12/2014   Costochondritis 11/12/2014   COVID    September 2021   Depression    only had brief episode in 2006 for couple of months when dad passed away.   Dyslipidemia 07-12-2017   Upper back pain on left side 11/12/2014   Wellness examination 12/22/2014   Past Surgical History:  Procedure Laterality Date   AUGMENTATION MAMMAPLASTY Bilateral 2013   Saline   CHOLECYSTECTOMY     NASAL SINUS SURGERY     Family History  Problem Relation Age of Onset   Hypertension Mother    Diabetes Father    Hypertension Maternal  Grandfather    Diabetes Paternal Grandmother    Colon cancer Neg Hx    Esophageal cancer Neg Hx    Rectal cancer Neg Hx    Stomach cancer Neg Hx    Current Medications[1] Allergies[2]  ROS: A complete ROS was performed with pertinent positives/negatives noted in the HPI. The remainder of the ROS are negative.   Objective:   There were no vitals filed for this visit.  GENERAL: Well-appearing, in NAD. Well nourished.  SKIN: Pink, warm and dry. No rash, lesion, ulceration, or ecchymoses.  NECK: Trachea midline. Full ROM w/o pain or tenderness. No lymphadenopathy.  RESPIRATORY: Chest wall symmetrical. Respirations even and non-labored. Breath sounds clear to auscultation bilaterally.  CARDIAC: S1, S2 present, regular rate and rhythm. Peripheral pulses 2+ bilaterally.  EXTREMITIES: Without clubbing, cyanosis, or edema.  NEUROLOGIC: No motor or sensory deficits. Steady, even gait.  PSYCH/MENTAL STATUS: Alert, oriented x 3. Cooperative, appropriate mood and affect.   Health Maintenance Due  Topic Date Due   Hepatitis C Screening  Never done   Cervical Cancer Screening (HPV/Pap Cotest)  10/12/2015   Mammogram  04/29/2024    No results found for any visits on 03/11/24.  Assessment & Plan:   There are no diagnoses linked to this encounter.  No orders of the defined types were placed in this encounter.  No orders of the defined types were placed in this encounter.   No follow-ups on file.   Rosina Senters, FNP    [1]  Current Outpatient  Medications:    budesonide -formoterol  (SYMBICORT ) 80-4.5 MCG/ACT inhaler, Inhale 2 puffs into the lungs 2 (two) times daily., Disp: 1 each, Rfl: 3   montelukast  (SINGULAIR ) 10 MG tablet, Take 1 tablet (10 mg total) by mouth at bedtime., Disp: 90 tablet, Rfl: 3   semaglutide -weight management (WEGOVY ) 0.25 MG/0.5ML SOAJ SQ injection, 0.25 mg weekly injection, Disp: 2 mL, Rfl: 0  Current Facility-Administered Medications:    0.9 %  sodium  chloride infusion, , Intravenous, Once, Christopher Savannah, PA-C [2]  Allergies Allergen Reactions   Medroxyprogesterone     Weight gain  Bloating Vision changes Fatigue  Mood swings   "

## 2024-03-15 ENCOUNTER — Telehealth: Admitting: Nurse Practitioner

## 2024-03-15 DIAGNOSIS — J069 Acute upper respiratory infection, unspecified: Secondary | ICD-10-CM

## 2024-03-15 MED ORDER — PREDNISONE 10 MG PO TABS: ORAL_TABLET | ORAL | 0 refills | Status: AC

## 2024-03-15 MED ORDER — PROMETHAZINE-DM 6.25-15 MG/5ML PO SYRP: 5.0000 mL | ORAL_SOLUTION | Freq: Four times a day (QID) | ORAL | 0 refills | Status: AC | PRN

## 2024-03-15 NOTE — Progress Notes (Signed)
 We are sorry that you are not feeling well.  Here is how we plan to help!  Based on your presentation I believe you most likely have A cough due to a virus.  This is called viral bronchitis and is best treated by rest, plenty of fluids and control of the cough.  You may use Ibuprofen  or Tylenol  as directed to help your symptoms.     In addition you may use a prescription cough syrup that has been sent   Directions for 6 day taper: Day 1: 2 tablets before breakfast, 1 after both lunch & dinner and 2 at bedtime Day 2: 1 tab before breakfast, 1 after both lunch & dinner and 2 at bedtime Day 3: 1 tab at each meal & 1 at bedtime Day 4: 1 tab at breakfast, 1 at lunch, 1 at bedtime Day 5: 1 tab at breakfast & 1 tab at bedtime Day 6: 1 tab at breakfast  From your responses in the eVisit questionnaire you describe inflammation in the upper respiratory tract which is causing a significant cough.  This is commonly called Bronchitis and has four common causes:   Allergies Viral Infections Acid Reflux Bacterial Infection Allergies, viruses and acid reflux are treated by controlling symptoms or eliminating the cause. An example might be a cough caused by taking certain blood pressure medications. You stop the cough by changing the medication. Another example might be a cough caused by acid reflux. Controlling the reflux helps control the cough.  USE OF BRONCHODILATOR (RESCUE) INHALERS: There is a risk from using your bronchodilator too frequently.  The risk is that over-reliance on a medication which only relaxes the muscles surrounding the breathing tubes can reduce the effectiveness of medications prescribed to reduce swelling and congestion of the tubes themselves.  Although you feel brief relief from the bronchodilator inhaler, your asthma may actually be worsening with the tubes becoming more swollen and filled with mucus.  This can delay other crucial treatments, such as oral steroid medications. If  you need to use a bronchodilator inhaler daily, several times per day, you should discuss this with your provider.  There are probably better treatments that could be used to keep your asthma under control.     HOME CARE Only take medications as instructed by your medical team. Complete the entire course of an antibiotic. Drink plenty of fluids and get plenty of rest. Avoid close contacts especially the very young and the elderly Cover your mouth if you cough or cough into your sleeve. Always remember to wash your hands A steam or ultrasonic humidifier can help congestion.   GET HELP RIGHT AWAY IF: You develop worsening fever. You become short of breath You cough up blood. Your symptoms persist after you have completed your treatment plan MAKE SURE YOU  Understand these instructions. Will watch your condition. Will get help right away if you are not doing well or get worse.  Your e-visit answers were reviewed by a board certified advanced clinical practitioner to complete your personal care plan.  Depending on the condition, your plan could have included both over the counter or prescription medications. If there is a problem please reply  once you have received a response from your provider. Your safety is important to us .  If you have drug allergies check your prescription carefully.    You can use MyChart to ask questions about todays visit, request a non-urgent call back, or ask for a work or school excuse for 24  hours related to this e-Visit. If it has been greater than 24 hours you will need to follow up with your provider, or enter a new e-Visit to address those concerns. You will get an e-mail in the next two days asking about your experience.  I hope that your e-visit has been valuable and will speed your recovery. Thank you for using e-visits.   I have spent 5 minutes in review of e-visit questionnaire, review and updating patient chart, medical decision making and response to  patient.   Brice Potteiger W Ermagene Saidi, NP

## 2024-03-18 ENCOUNTER — Encounter: Payer: Self-pay | Admitting: Medical

## 2024-03-18 MED ORDER — WEGOVY 0.5 MG/0.5ML ~~LOC~~ SOAJ: 0.5000 mg | SUBCUTANEOUS | 0 refills | Status: AC

## 2024-03-18 NOTE — Addendum Note (Signed)
 Addended by: DORINA DALLAS DORINA PA-C M on: 03/18/2024 08:19 AM   Modules accepted: Orders

## 2024-03-19 ENCOUNTER — Telehealth: Admitting: Emergency Medicine

## 2024-03-19 DIAGNOSIS — B9689 Other specified bacterial agents as the cause of diseases classified elsewhere: Secondary | ICD-10-CM

## 2024-03-19 DIAGNOSIS — J069 Acute upper respiratory infection, unspecified: Secondary | ICD-10-CM

## 2024-03-19 MED ORDER — AMOXICILLIN-POT CLAVULANATE 875-125 MG PO TABS: 1.0000 | ORAL_TABLET | Freq: Two times a day (BID) | ORAL | 0 refills | Status: AC

## 2024-03-19 MED ORDER — AZELASTINE HCL 0.1 % NA SOLN: 2.0000 | Freq: Two times a day (BID) | NASAL | 0 refills | Status: AC

## 2024-03-19 NOTE — Progress Notes (Signed)
 The questionnaire you filled out is for the flu. I think it's not likely you have the flu. It would help me make sure you get the right treatment that you need if you filled out a different questionnaire that most fits your symptoms. There is an Evisit questionnaire for URI, sinus, and cough. Will you please choose the one that most fits your symptoms and submit it?   Kind regards,  Jon Belt, PhD, FNP-BC [facility]    NOTE: There will be NO CHARGE for this E-Visit   If you are having a true medical emergency, please call 911.     For an urgent face to face visit, [facility] has multiple urgent care centers for your convenience.  Click the link below for the full list of locations and hours, walk-in wait times, appointment scheduling options and driving directions:  Urgent Care - [place], [place], [place], [place], [place], [place]  [place]     Your MyChart E-visit questionnaire answers were reviewed by a board certified advanced clinical practitioner to complete your personal care plan based on your specific symptoms.    Thank you for using e-Visits.

## 2024-03-19 NOTE — Progress Notes (Signed)
 "    E-Visit for Sinus Problems  We are sorry that you are not feeling well.  Here is how we plan to help!  Thank you for filling out this questionnaire. I think, based on your answers, you most likely have a sinus infection. I suspect drainage is going down your throat and making you cough. I'm hoping this treatment plan takes care of it and helps you get better. If you are not getting better, or if you are having shortness of breath, please get checked in person.     Based on what you have shared with me it looks like you have sinusitis.  Sinusitis is inflammation and infection in the sinus cavities of the head.  Based on your presentation I believe you most likely have Acute Bacterial Sinusitis.  This is an infection caused by bacteria and is treated with antibiotics. I have prescribed Augmentin  875mg /125mg  one tablet twice daily with food, for 7 days. and I have also prescribed Azelastine  Nasal Spray Use 1 spray in each nostril twice daily for 10-14 days   You may use an oral decongestant such as Mucinex D or if you have glaucoma or high blood pressure use plain Mucinex.   Saline nasal spray help and can safely be used as often as needed for congestion.  Try using saline irrigation, such as with a neti pot, several times a day while you are sick. Many neti pots come with salt packets premeasured to use to make saline. If you use your own salt, make sure it is kosher salt or sea salt (don't use table salt as it has iodine in it and you don't need that in your nose). Use distilled water to make saline. If you mix your own saline using your own salt, the recipe is 1/4 teaspoon salt in 1 cup warm water. Using saline irrigation can help prevent and treat sinus infections.   If you develop worsening sinus pain, fever or notice severe headache and vision changes, or if symptoms are not better after completion of antibiotic, please schedule an appointment with a health care provider.    Sinus infections  are not as easily transmitted as other respiratory infection, however we still recommend that you avoid close contact with loved ones, especially the very young and elderly.  Remember to wash your hands thoroughly throughout the day as this is the number one way to prevent the spread of infection!  Home Care: Only take medications as instructed by your medical team. Complete the entire course of an antibiotic. Do not take these medications with alcohol. A steam or ultrasonic humidifier can help congestion.  You can place a towel over your head and breathe in the steam from hot water coming from a faucet. Avoid close contacts especially the very young and the elderly. Cover your mouth when you cough or sneeze. Always remember to wash your hands.  Get Help Right Away If: You develop worsening fever or sinus pain. You develop a severe head ache or visual changes. Your symptoms persist after you have completed your treatment plan.  Make sure you Understand these instructions. Will watch your condition. Will get help right away if you are not doing well or get worse.  Your e-visit answers were reviewed by a board certified advanced clinical practitioner to complete your personal care plan.  Depending on the condition, your plan could have included both over the counter or prescription medications.  If there is a problem please reply  once you  have received a response from your provider.  Your safety is important to us .  If you have drug allergies check your prescription carefully.    You can use MyChart to ask questions about todays visit, request a non-urgent call back, or ask for a work or school excuse for 24 hours related to this e-Visit. If it has been greater than 24 hours you will need to follow up with your provider, or enter a new e-Visit to address those concerns.  You will get an e-mail in the next two days asking about your experience.  I hope that your e-visit has been valuable  and will speed your recovery. Thank you for using e-visits.  I have spent 5 minutes in review of e-visit questionnaire, review and updating patient chart, medical decision making and response to patient.   Jon Belt, PhD, FNP-BC        "

## 2024-03-20 ENCOUNTER — Other Ambulatory Visit (HOSPITAL_BASED_OUTPATIENT_CLINIC_OR_DEPARTMENT_OTHER): Payer: Self-pay | Admitting: Medical

## 2024-03-20 DIAGNOSIS — Z1231 Encounter for screening mammogram for malignant neoplasm of breast: Secondary | ICD-10-CM

## 2024-03-27 ENCOUNTER — Other Ambulatory Visit: Payer: Self-pay | Admitting: Medical

## 2024-03-28 ENCOUNTER — Encounter: Payer: Self-pay | Admitting: Medical

## 2024-03-28 ENCOUNTER — Telehealth: Payer: Self-pay

## 2024-03-28 NOTE — Telephone Encounter (Signed)
 Called pt to see why she is in need of rx 4 tab xanax . She states it is for travel seeing as she has anxiety when getting on a plane. Told her I would notify pcp

## 2024-03-28 NOTE — Telephone Encounter (Signed)
 Rx refill of xanax  sent after hours. I could get to this request until 9 pm at night on friday. Hope pt can fill before her flight.

## 2024-04-10 ENCOUNTER — Other Ambulatory Visit: Payer: Self-pay | Admitting: Emergency Medicine

## 2024-04-14 ENCOUNTER — Encounter: Admitting: Internal Medicine

## 2024-04-24 ENCOUNTER — Ambulatory Visit: Admitting: Medical

## 2024-05-01 ENCOUNTER — Ambulatory Visit (HOSPITAL_BASED_OUTPATIENT_CLINIC_OR_DEPARTMENT_OTHER)

## 2024-05-05 ENCOUNTER — Ambulatory Visit (HOSPITAL_BASED_OUTPATIENT_CLINIC_OR_DEPARTMENT_OTHER)

## 2024-05-08 ENCOUNTER — Encounter: Admitting: Internal Medicine
